# Patient Record
Sex: Female | Born: 1935 | Race: Black or African American | Hispanic: No | Marital: Married | Smoking: Never smoker
Health system: Southern US, Community
[De-identification: ages and names within clinical notes are randomized; demographics above are authoritative.]

## PROBLEM LIST (undated history)

## (undated) DIAGNOSIS — N952 Postmenopausal atrophic vaginitis: Secondary | ICD-10-CM

## (undated) DIAGNOSIS — K08109 Complete loss of teeth, unspecified cause, unspecified class: Secondary | ICD-10-CM

## (undated) DIAGNOSIS — F419 Anxiety disorder, unspecified: Secondary | ICD-10-CM

## (undated) DIAGNOSIS — Z8701 Personal history of pneumonia (recurrent): Secondary | ICD-10-CM

## (undated) DIAGNOSIS — T8859XA Other complications of anesthesia, initial encounter: Secondary | ICD-10-CM

## (undated) DIAGNOSIS — Z972 Presence of dental prosthetic device (complete) (partial): Secondary | ICD-10-CM

## (undated) DIAGNOSIS — T7840XA Allergy, unspecified, initial encounter: Secondary | ICD-10-CM

## (undated) DIAGNOSIS — I471 Supraventricular tachycardia, unspecified: Secondary | ICD-10-CM

## (undated) DIAGNOSIS — M1990 Unspecified osteoarthritis, unspecified site: Secondary | ICD-10-CM

## (undated) DIAGNOSIS — I10 Essential (primary) hypertension: Secondary | ICD-10-CM

## (undated) DIAGNOSIS — K219 Gastro-esophageal reflux disease without esophagitis: Secondary | ICD-10-CM

## (undated) DIAGNOSIS — T4145XA Adverse effect of unspecified anesthetic, initial encounter: Secondary | ICD-10-CM

## (undated) DIAGNOSIS — H409 Unspecified glaucoma: Secondary | ICD-10-CM

## (undated) DIAGNOSIS — C801 Malignant (primary) neoplasm, unspecified: Secondary | ICD-10-CM

## (undated) DIAGNOSIS — E785 Hyperlipidemia, unspecified: Secondary | ICD-10-CM

## (undated) DIAGNOSIS — K5790 Diverticulosis of intestine, part unspecified, without perforation or abscess without bleeding: Secondary | ICD-10-CM

## (undated) DIAGNOSIS — M810 Age-related osteoporosis without current pathological fracture: Secondary | ICD-10-CM

## (undated) DIAGNOSIS — E739 Lactose intolerance, unspecified: Secondary | ICD-10-CM

## (undated) DIAGNOSIS — I456 Pre-excitation syndrome: Secondary | ICD-10-CM

## (undated) DIAGNOSIS — Z8542 Personal history of malignant neoplasm of other parts of uterus: Secondary | ICD-10-CM

## (undated) DIAGNOSIS — E559 Vitamin D deficiency, unspecified: Secondary | ICD-10-CM

## (undated) DIAGNOSIS — Z9889 Other specified postprocedural states: Secondary | ICD-10-CM

## (undated) DIAGNOSIS — R112 Nausea with vomiting, unspecified: Secondary | ICD-10-CM

## (undated) DIAGNOSIS — Z9289 Personal history of other medical treatment: Secondary | ICD-10-CM

## (undated) HISTORY — PX: TOTAL HIP ARTHROPLASTY: SHX124

## (undated) HISTORY — DX: Anxiety disorder, unspecified: F41.9

## (undated) HISTORY — DX: Gastro-esophageal reflux disease without esophagitis: K21.9

## (undated) HISTORY — DX: Diverticulosis of intestine, part unspecified, without perforation or abscess without bleeding: K57.90

## (undated) HISTORY — DX: Vitamin D deficiency, unspecified: E55.9

## (undated) HISTORY — DX: Lactose intolerance, unspecified: E73.9

## (undated) HISTORY — DX: Personal history of malignant neoplasm of other parts of uterus: Z85.42

## (undated) HISTORY — DX: Personal history of other medical treatment: Z92.89

## (undated) HISTORY — DX: Unspecified glaucoma: H40.9

## (undated) HISTORY — DX: Postmenopausal atrophic vaginitis: N95.2

## (undated) HISTORY — DX: Presence of dental prosthetic device (complete) (partial): Z97.2

## (undated) HISTORY — PX: NM MYOCAR PERF WALL MOTION: HXRAD629

## (undated) HISTORY — DX: Allergy, unspecified, initial encounter: T78.40XA

## (undated) HISTORY — DX: Pre-excitation syndrome: I45.6

## (undated) HISTORY — DX: Essential (primary) hypertension: I10

## (undated) HISTORY — DX: Hyperlipidemia, unspecified: E78.5

## (undated) HISTORY — PX: ABDOMINAL HYSTERECTOMY: SHX81

## (undated) HISTORY — PX: THROAT SURGERY: SHX803

## (undated) HISTORY — PX: JOINT REPLACEMENT: SHX530

## (undated) HISTORY — DX: Supraventricular tachycardia: I47.1

## (undated) HISTORY — DX: Age-related osteoporosis without current pathological fracture: M81.0

## (undated) HISTORY — PX: CATARACT EXTRACTION: SUR2

## (undated) HISTORY — DX: Complete loss of teeth, unspecified cause, unspecified class: K08.109

## (undated) HISTORY — DX: Supraventricular tachycardia, unspecified: I47.10

---
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
## 1999-06-21 ENCOUNTER — Other Ambulatory Visit: Admission: RE | Admit: 1999-06-21 | Discharge: 1999-06-21 | Payer: Self-pay | Admitting: Obstetrics and Gynecology

## 2002-01-20 ENCOUNTER — Encounter: Admission: RE | Admit: 2002-01-20 | Discharge: 2002-01-20 | Payer: Self-pay | Admitting: Family Medicine

## 2002-01-20 ENCOUNTER — Encounter: Payer: Self-pay | Admitting: Family Medicine

## 2002-12-17 DIAGNOSIS — Z8542 Personal history of malignant neoplasm of other parts of uterus: Secondary | ICD-10-CM

## 2002-12-17 HISTORY — DX: Personal history of malignant neoplasm of other parts of uterus: Z85.42

## 2002-12-18 ENCOUNTER — Encounter: Payer: Self-pay | Admitting: Obstetrics and Gynecology

## 2002-12-18 ENCOUNTER — Ambulatory Visit (HOSPITAL_COMMUNITY): Admission: RE | Admit: 2002-12-18 | Discharge: 2002-12-18 | Payer: Self-pay | Admitting: Obstetrics and Gynecology

## 2002-12-22 ENCOUNTER — Ambulatory Visit: Admission: RE | Admit: 2002-12-22 | Discharge: 2002-12-22 | Payer: Self-pay | Admitting: Gynecology

## 2002-12-24 ENCOUNTER — Encounter: Payer: Self-pay | Admitting: Gynecology

## 2002-12-29 ENCOUNTER — Inpatient Hospital Stay (HOSPITAL_COMMUNITY): Admission: RE | Admit: 2002-12-29 | Discharge: 2003-01-01 | Payer: Self-pay | Admitting: Obstetrics and Gynecology

## 2002-12-29 ENCOUNTER — Encounter (INDEPENDENT_AMBULATORY_CARE_PROVIDER_SITE_OTHER): Payer: Self-pay

## 2003-01-05 ENCOUNTER — Ambulatory Visit: Admission: RE | Admit: 2003-01-05 | Discharge: 2003-01-05 | Payer: Self-pay | Admitting: Gynecologic Oncology

## 2003-03-03 ENCOUNTER — Ambulatory Visit: Admission: RE | Admit: 2003-03-03 | Discharge: 2003-03-03 | Payer: Self-pay | Admitting: Gynecologic Oncology

## 2003-03-03 ENCOUNTER — Encounter (INDEPENDENT_AMBULATORY_CARE_PROVIDER_SITE_OTHER): Payer: Self-pay | Admitting: *Deleted

## 2003-03-03 ENCOUNTER — Other Ambulatory Visit: Admission: RE | Admit: 2003-03-03 | Discharge: 2003-03-03 | Payer: Self-pay | Admitting: Gynecologic Oncology

## 2003-05-24 ENCOUNTER — Other Ambulatory Visit: Admission: RE | Admit: 2003-05-24 | Discharge: 2003-05-24 | Payer: Self-pay | Admitting: Obstetrics and Gynecology

## 2003-08-31 ENCOUNTER — Other Ambulatory Visit: Admission: RE | Admit: 2003-08-31 | Discharge: 2003-08-31 | Payer: Self-pay | Admitting: Gynecology

## 2003-08-31 ENCOUNTER — Ambulatory Visit: Admission: RE | Admit: 2003-08-31 | Discharge: 2003-08-31 | Payer: Self-pay | Admitting: Gynecology

## 2003-08-31 ENCOUNTER — Encounter (INDEPENDENT_AMBULATORY_CARE_PROVIDER_SITE_OTHER): Payer: Self-pay | Admitting: Specialist

## 2003-11-23 ENCOUNTER — Other Ambulatory Visit: Admission: RE | Admit: 2003-11-23 | Discharge: 2003-11-23 | Payer: Self-pay | Admitting: Obstetrics and Gynecology

## 2004-02-22 ENCOUNTER — Other Ambulatory Visit: Admission: RE | Admit: 2004-02-22 | Discharge: 2004-02-22 | Payer: Self-pay | Admitting: Gynecology

## 2004-02-22 ENCOUNTER — Ambulatory Visit: Admission: RE | Admit: 2004-02-22 | Discharge: 2004-02-22 | Payer: Self-pay | Admitting: Gynecology

## 2004-02-22 ENCOUNTER — Encounter (INDEPENDENT_AMBULATORY_CARE_PROVIDER_SITE_OTHER): Payer: Self-pay | Admitting: Specialist

## 2004-09-18 ENCOUNTER — Other Ambulatory Visit: Admission: RE | Admit: 2004-09-18 | Discharge: 2004-09-18 | Payer: Self-pay | Admitting: Gynecologic Oncology

## 2004-09-18 ENCOUNTER — Ambulatory Visit: Admission: RE | Admit: 2004-09-18 | Discharge: 2004-09-18 | Payer: Self-pay | Admitting: Gynecologic Oncology

## 2004-09-18 ENCOUNTER — Encounter (INDEPENDENT_AMBULATORY_CARE_PROVIDER_SITE_OTHER): Payer: Self-pay | Admitting: Specialist

## 2004-10-26 ENCOUNTER — Encounter (INDEPENDENT_AMBULATORY_CARE_PROVIDER_SITE_OTHER): Payer: Self-pay | Admitting: *Deleted

## 2004-10-26 ENCOUNTER — Ambulatory Visit (HOSPITAL_COMMUNITY): Admission: RE | Admit: 2004-10-26 | Discharge: 2004-10-26 | Payer: Self-pay | Admitting: Gastroenterology

## 2005-01-24 ENCOUNTER — Other Ambulatory Visit: Admission: RE | Admit: 2005-01-24 | Discharge: 2005-01-24 | Payer: Self-pay | Admitting: Obstetrics and Gynecology

## 2005-04-06 ENCOUNTER — Encounter: Admission: RE | Admit: 2005-04-06 | Discharge: 2005-04-06 | Payer: Self-pay | Admitting: Family Medicine

## 2005-04-25 ENCOUNTER — Encounter: Admission: RE | Admit: 2005-04-25 | Discharge: 2005-04-25 | Payer: Self-pay | Admitting: Orthopedic Surgery

## 2005-05-15 ENCOUNTER — Ambulatory Visit: Admission: RE | Admit: 2005-05-15 | Discharge: 2005-05-15 | Payer: Self-pay | Admitting: Gynecologic Oncology

## 2005-05-15 ENCOUNTER — Encounter (INDEPENDENT_AMBULATORY_CARE_PROVIDER_SITE_OTHER): Payer: Self-pay | Admitting: *Deleted

## 2005-11-19 ENCOUNTER — Other Ambulatory Visit: Admission: RE | Admit: 2005-11-19 | Discharge: 2005-11-19 | Payer: Self-pay | Admitting: Obstetrics and Gynecology

## 2006-02-25 ENCOUNTER — Other Ambulatory Visit: Admission: RE | Admit: 2006-02-25 | Discharge: 2006-02-25 | Payer: Self-pay | Admitting: Obstetrics and Gynecology

## 2006-05-22 ENCOUNTER — Other Ambulatory Visit: Admission: RE | Admit: 2006-05-22 | Discharge: 2006-05-22 | Payer: Self-pay | Admitting: Gynecologic Oncology

## 2006-05-22 ENCOUNTER — Encounter (INDEPENDENT_AMBULATORY_CARE_PROVIDER_SITE_OTHER): Payer: Self-pay | Admitting: *Deleted

## 2006-05-22 ENCOUNTER — Ambulatory Visit: Admission: RE | Admit: 2006-05-22 | Discharge: 2006-05-22 | Payer: Self-pay | Admitting: Gynecologic Oncology

## 2006-11-22 ENCOUNTER — Encounter: Admission: RE | Admit: 2006-11-22 | Discharge: 2006-11-22 | Payer: Self-pay | Admitting: Orthopedic Surgery

## 2006-12-02 ENCOUNTER — Other Ambulatory Visit: Admission: RE | Admit: 2006-12-02 | Discharge: 2006-12-02 | Payer: Self-pay | Admitting: Obstetrics and Gynecology

## 2007-03-05 ENCOUNTER — Ambulatory Visit: Payer: Self-pay | Admitting: Family Medicine

## 2007-06-13 ENCOUNTER — Other Ambulatory Visit: Admission: RE | Admit: 2007-06-13 | Discharge: 2007-06-13 | Payer: Self-pay | Admitting: Gynecology

## 2007-06-13 ENCOUNTER — Encounter: Payer: Self-pay | Admitting: Gynecology

## 2007-06-13 ENCOUNTER — Ambulatory Visit: Admission: RE | Admit: 2007-06-13 | Discharge: 2007-06-13 | Payer: Self-pay | Admitting: Gynecology

## 2007-07-29 ENCOUNTER — Inpatient Hospital Stay (HOSPITAL_COMMUNITY): Admission: RE | Admit: 2007-07-29 | Discharge: 2007-08-02 | Payer: Self-pay | Admitting: Orthopedic Surgery

## 2007-07-29 ENCOUNTER — Encounter (INDEPENDENT_AMBULATORY_CARE_PROVIDER_SITE_OTHER): Payer: Self-pay | Admitting: Orthopedic Surgery

## 2007-10-23 ENCOUNTER — Ambulatory Visit: Payer: Self-pay | Admitting: Family Medicine

## 2007-10-27 ENCOUNTER — Ambulatory Visit: Payer: Self-pay | Admitting: Family Medicine

## 2007-11-06 ENCOUNTER — Other Ambulatory Visit: Admission: RE | Admit: 2007-11-06 | Discharge: 2007-11-06 | Payer: Self-pay | Admitting: Obstetrics and Gynecology

## 2008-05-05 ENCOUNTER — Ambulatory Visit: Payer: Self-pay | Admitting: Family Medicine

## 2008-05-11 ENCOUNTER — Ambulatory Visit: Payer: Self-pay | Admitting: Family Medicine

## 2008-08-06 ENCOUNTER — Ambulatory Visit: Payer: Self-pay | Admitting: Family Medicine

## 2008-08-24 ENCOUNTER — Ambulatory Visit: Payer: Self-pay | Admitting: Family Medicine

## 2008-09-29 ENCOUNTER — Ambulatory Visit: Payer: Self-pay | Admitting: Family Medicine

## 2008-11-03 ENCOUNTER — Other Ambulatory Visit: Admission: RE | Admit: 2008-11-03 | Discharge: 2008-11-03 | Payer: Self-pay | Admitting: Family Medicine

## 2008-11-03 ENCOUNTER — Ambulatory Visit: Payer: Self-pay | Admitting: Family Medicine

## 2008-11-03 ENCOUNTER — Encounter: Payer: Self-pay | Admitting: Family Medicine

## 2009-03-02 ENCOUNTER — Encounter: Admission: RE | Admit: 2009-03-02 | Discharge: 2009-03-02 | Payer: Self-pay | Admitting: Orthopedic Surgery

## 2009-06-07 ENCOUNTER — Inpatient Hospital Stay (HOSPITAL_COMMUNITY): Admission: RE | Admit: 2009-06-07 | Discharge: 2009-06-11 | Payer: Self-pay | Admitting: Orthopedic Surgery

## 2009-09-12 ENCOUNTER — Ambulatory Visit: Payer: Self-pay | Admitting: Family Medicine

## 2009-11-09 ENCOUNTER — Ambulatory Visit: Payer: Self-pay | Admitting: Family Medicine

## 2010-04-17 IMAGING — CR DG PORTABLE PELVIS
1 series · 1 of 1 positions shown · non-contrast
Comparison: Pelvis film of 07/29/2007

CLINICAL DATA: Left hip arthroplasty

PORTABLE PELVIS

[view not recorded]
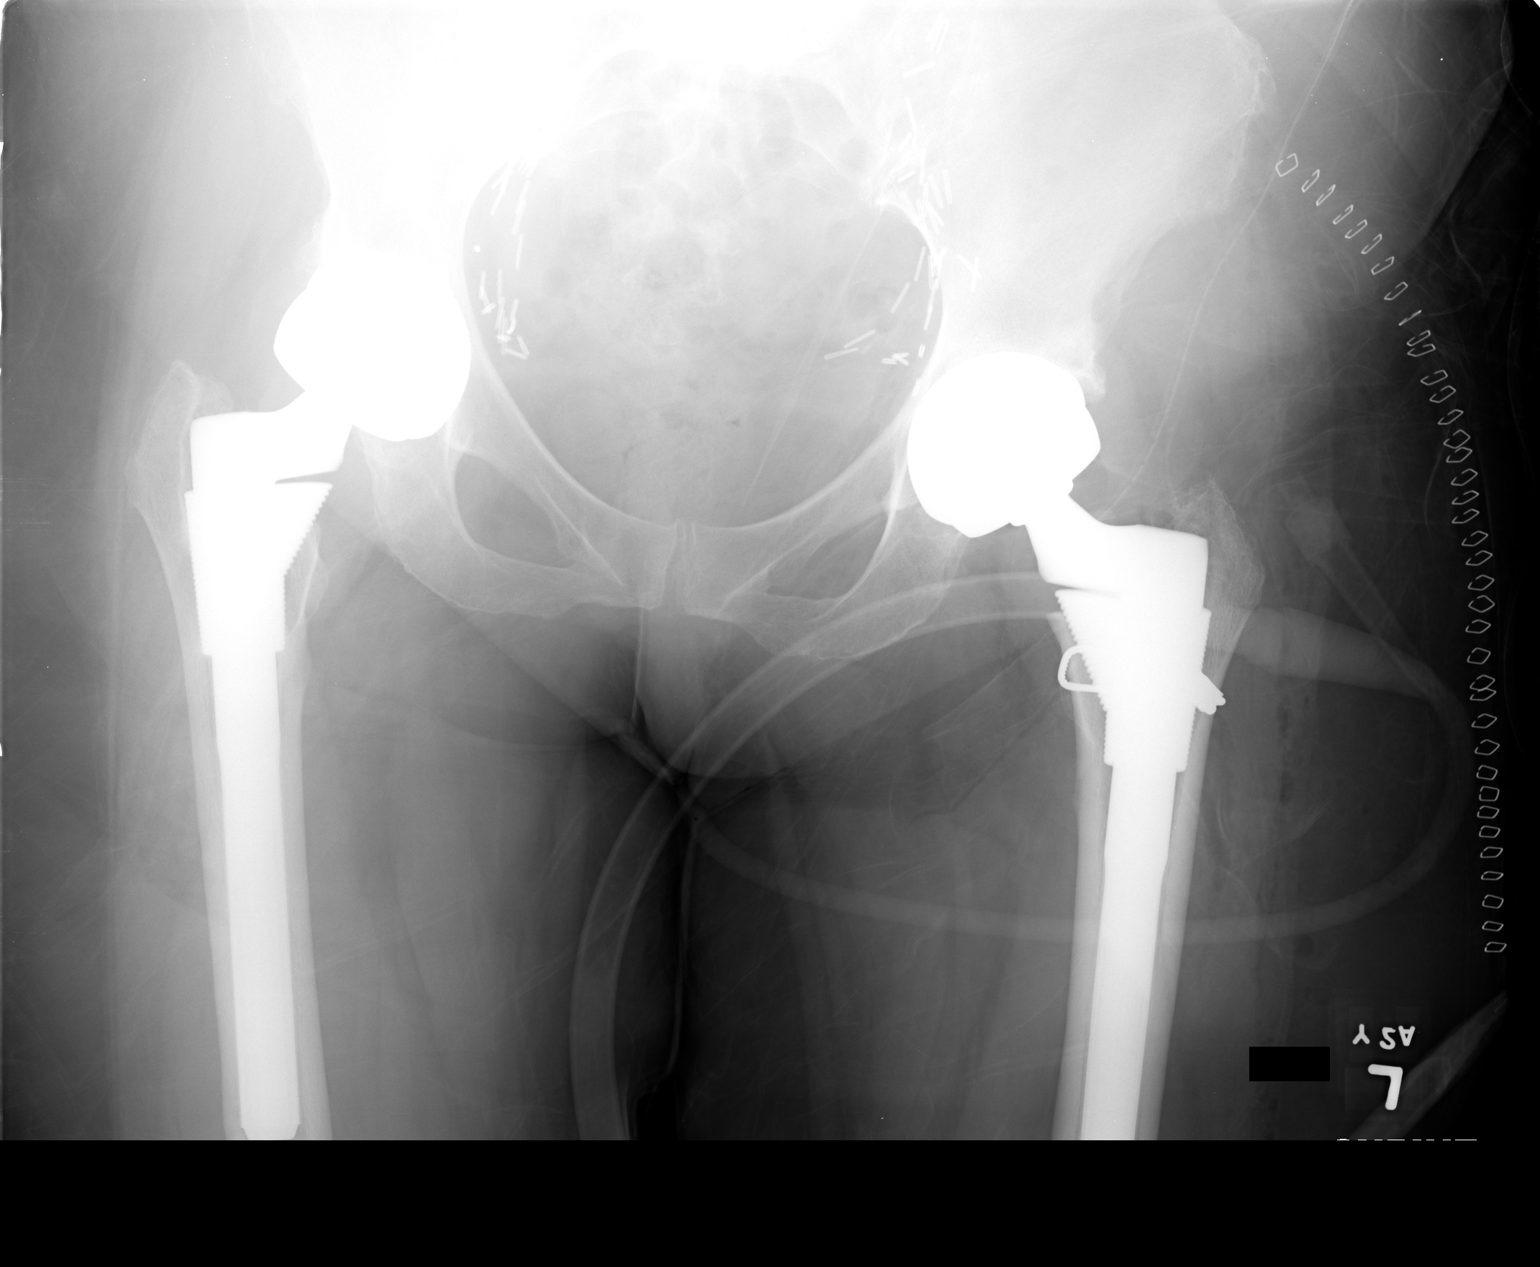

[1 of 1 positions shown; findings below may reference images not displayed]

FINDINGS: Left hip arthroplasty is now present in good position on
the film obtained.  The pelvic rami are intact.  Right hip
arthroplasty is unchanged.  Surgical clips overlie both pelvic
sidewalls.
IMPRESSION: Left hip arthroplasty in good position on portable film of the
pelvis.

## 2010-04-17 IMAGING — CR DG HIP 1V PORT*L*
1 series · 1 of 1 positions shown · non-contrast
Comparison: None

CLINICAL DATA: Postop left hip surgery

PORTABLE LEFT HIP - 1 VIEW

[view not recorded]
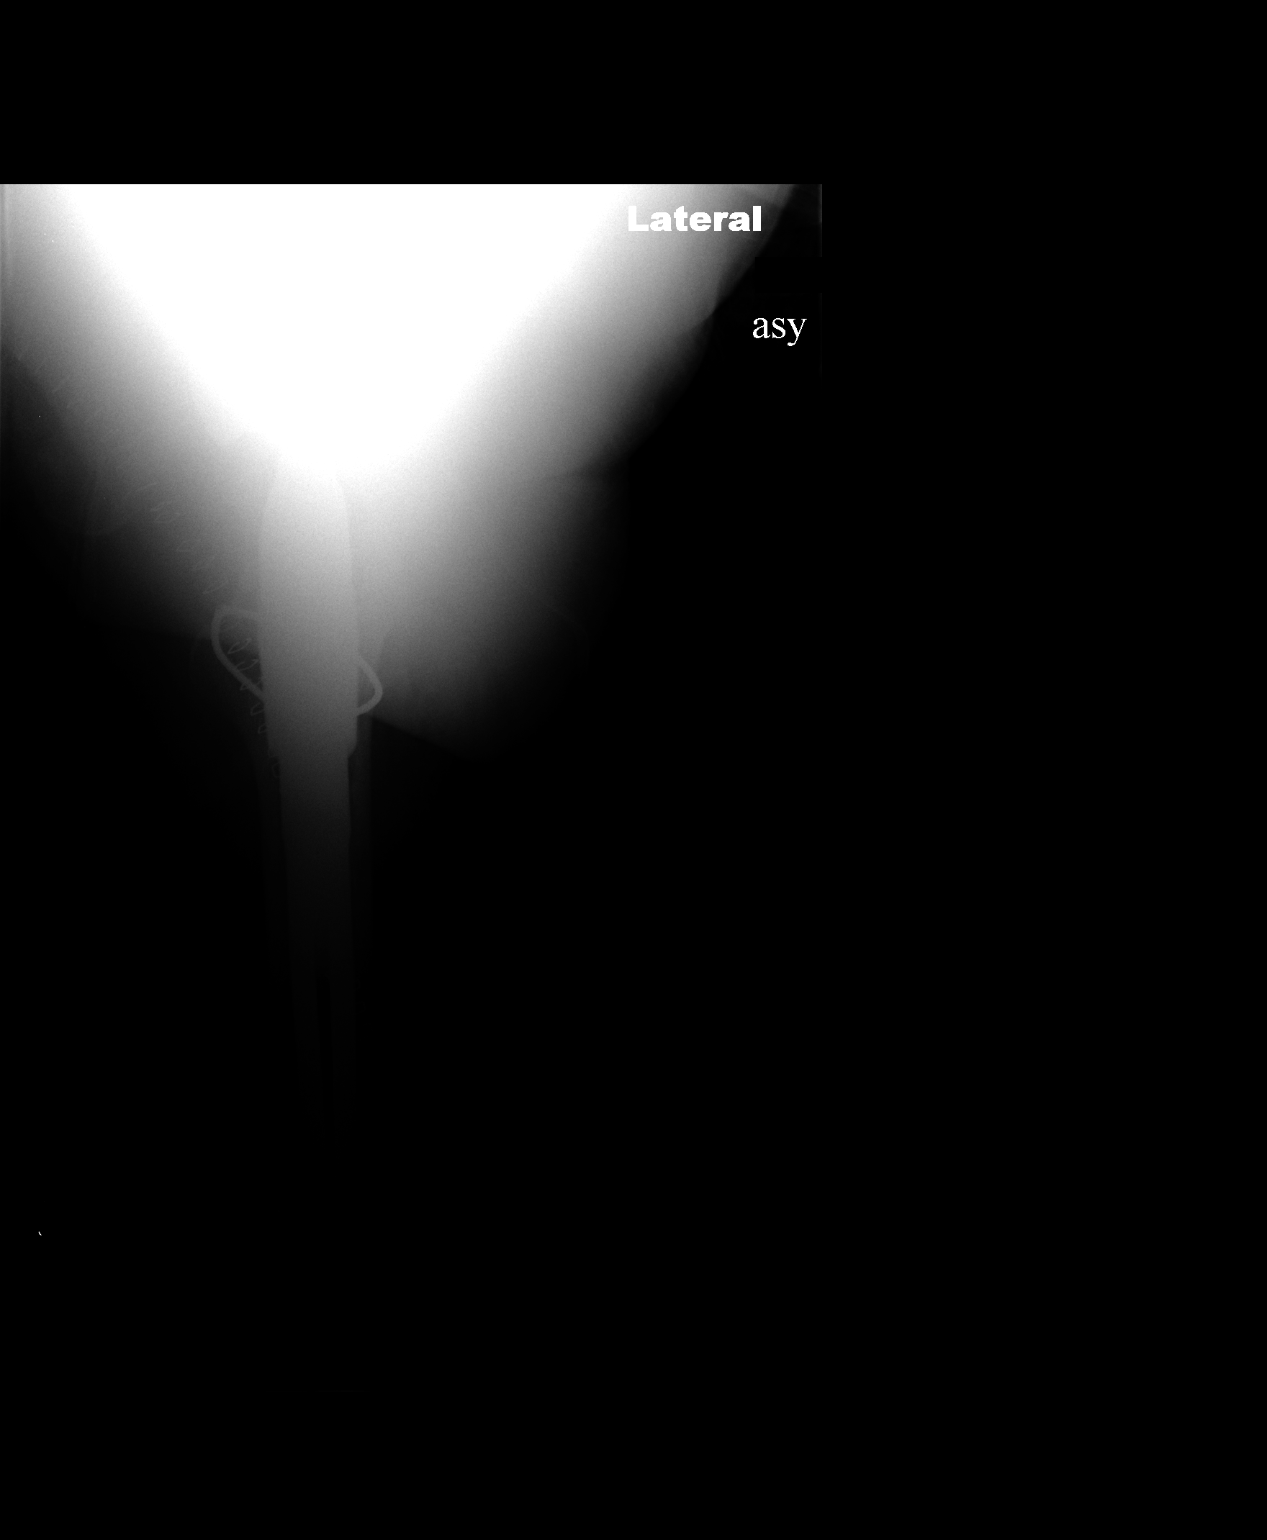

[1 of 1 positions shown; findings below may reference images not displayed]

FINDINGS: A portable lateral view of the left hip is over
penetrated. Alignment appears grossly normal.  Bony structures
cannot be well assessed.
IMPRESSION: Suboptimal portable lateral view of the left hip.

## 2010-05-25 ENCOUNTER — Ambulatory Visit: Payer: Self-pay | Admitting: Family Medicine

## 2010-05-31 ENCOUNTER — Ambulatory Visit: Payer: Self-pay | Admitting: Physician Assistant

## 2010-06-22 DIAGNOSIS — Z9289 Personal history of other medical treatment: Secondary | ICD-10-CM

## 2010-06-22 HISTORY — DX: Personal history of other medical treatment: Z92.89

## 2010-08-03 ENCOUNTER — Ambulatory Visit: Payer: Self-pay | Admitting: Family Medicine

## 2010-08-22 ENCOUNTER — Encounter: Admission: RE | Admit: 2010-08-22 | Discharge: 2010-08-22 | Payer: Self-pay | Admitting: Orthopedic Surgery

## 2010-08-29 ENCOUNTER — Ambulatory Visit (HOSPITAL_COMMUNITY): Admission: RE | Admit: 2010-08-29 | Discharge: 2010-08-29 | Payer: Self-pay | Admitting: Orthopedic Surgery

## 2010-09-12 ENCOUNTER — Encounter (INDEPENDENT_AMBULATORY_CARE_PROVIDER_SITE_OTHER): Payer: Self-pay | Admitting: Orthopedic Surgery

## 2010-09-12 ENCOUNTER — Inpatient Hospital Stay (HOSPITAL_COMMUNITY)
Admission: RE | Admit: 2010-09-12 | Discharge: 2010-09-15 | Payer: Self-pay | Source: Home / Self Care | Admitting: Orthopedic Surgery

## 2011-01-07 ENCOUNTER — Encounter: Payer: Self-pay | Admitting: Orthopedic Surgery

## 2011-03-01 LAB — DIFFERENTIAL
Basophils Relative: 1 % (ref 0–1)
Eosinophils Absolute: 0.1 10*3/uL (ref 0.0–0.7)
Eosinophils Relative: 3 % (ref 0–5)
Lymphs Abs: 1.6 10*3/uL (ref 0.7–4.0)
Monocytes Absolute: 0.4 10*3/uL (ref 0.1–1.0)
Monocytes Relative: 10 % (ref 3–12)
Neutro Abs: 2.1 10*3/uL (ref 1.7–7.7)
Neutrophils Relative %: 50 % (ref 43–77)

## 2011-03-01 LAB — BODY FLUID CULTURE
Culture: NO GROWTH
Gram Stain: NONE SEEN

## 2011-03-01 LAB — BASIC METABOLIC PANEL
BUN: 10 mg/dL (ref 6–23)
CO2: 24 mEq/L (ref 19–32)
CO2: 26 mEq/L (ref 19–32)
Calcium: 7.7 mg/dL — ABNORMAL LOW (ref 8.4–10.5)
Calcium: 8 mg/dL — ABNORMAL LOW (ref 8.4–10.5)
Chloride: 107 mEq/L (ref 96–112)
Chloride: 107 mEq/L (ref 96–112)
Creatinine, Ser: 0.96 mg/dL (ref 0.4–1.2)
Creatinine, Ser: 1.2 mg/dL (ref 0.4–1.2)
GFR calc Af Amer: 53 mL/min — ABNORMAL LOW (ref >60)
GFR calc Af Amer: >60 mL/min (ref >60)
GFR calc Af Amer: >60 mL/min (ref >60)
GFR calc non Af Amer: 44 mL/min — ABNORMAL LOW (ref >60)
GFR calc non Af Amer: 57 mL/min — ABNORMAL LOW (ref >60)
Glucose, Bld: 109 mg/dL — ABNORMAL HIGH (ref 70–99)
Glucose, Bld: 112 mg/dL — ABNORMAL HIGH (ref 70–99)
Glucose, Bld: 77 mg/dL (ref 70–99)
Potassium: 4 mEq/L (ref 3.5–5.1)
Potassium: 4 mEq/L (ref 3.5–5.1)
Sodium: 139 mEq/L (ref 135–145)

## 2011-03-01 LAB — CBC
HCT: 24.6 % — ABNORMAL LOW (ref 36.0–46.0)
HCT: 26.4 % — ABNORMAL LOW (ref 36.0–46.0)
Hemoglobin: 8 g/dL — ABNORMAL LOW (ref 12.0–15.0)
Hemoglobin: 8.8 g/dL — ABNORMAL LOW (ref 12.0–15.0)
MCH: 27.4 pg (ref 26.0–34.0)
MCH: 27.4 pg (ref 26.0–34.0)
MCH: 27.8 pg (ref 26.0–34.0)
MCHC: 32.4 g/dL (ref 30.0–36.0)
MCHC: 32.5 g/dL (ref 30.0–36.0)
MCHC: 33.3 g/dL (ref 30.0–36.0)
MCV: 83.5 fL (ref 78.0–100.0)
MCV: 84.2 fL (ref 78.0–100.0)
MCV: 85.1 fL (ref 78.0–100.0)
Platelets: 142 10*3/uL — ABNORMAL LOW (ref 150–400)
Platelets: 143 10*3/uL — ABNORMAL LOW (ref 150–400)
Platelets: 177 10*3/uL (ref 150–400)
RBC: 2.92 MIL/uL — ABNORMAL LOW (ref 3.87–5.11)
RBC: 3.16 MIL/uL — ABNORMAL LOW (ref 3.87–5.11)
RDW: 14.9 % (ref 11.5–15.5)
RDW: 15.3 % (ref 11.5–15.5)
RDW: 15.4 % (ref 11.5–15.5)
WBC: 6.4 10*3/uL (ref 4.0–10.5)
WBC: 7.5 10*3/uL (ref 4.0–10.5)

## 2011-03-01 LAB — TYPE AND SCREEN: ABO/RH(D): B POS

## 2011-03-01 LAB — ANAEROBIC CULTURE: Gram Stain: NONE SEEN

## 2011-03-01 LAB — URINALYSIS, ROUTINE W REFLEX MICROSCOPIC
Bilirubin Urine: NEGATIVE
Glucose, UA: NEGATIVE mg/dL
Hgb urine dipstick: NEGATIVE
Specific Gravity, Urine: 1.013 (ref 1.005–1.030)
Urobilinogen, UA: 1 mg/dL (ref 0.0–1.0)
pH: 6.5 (ref 5.0–8.0)

## 2011-03-01 LAB — URINE MICROSCOPIC-ADD ON

## 2011-03-01 LAB — SEDIMENTATION RATE: Sed Rate: 47 mm/hr — ABNORMAL HIGH (ref 0–22)

## 2011-03-01 LAB — URINE CULTURE
Colony Count: 2000
Culture  Setup Time: 201109261559

## 2011-03-01 LAB — PROTIME-INR
INR: 1.92 — ABNORMAL HIGH (ref 0.00–1.49)
Prothrombin Time: 13.4 seconds (ref 11.6–15.2)
Prothrombin Time: 16.3 seconds — ABNORMAL HIGH (ref 11.6–15.2)
Prothrombin Time: 22.1 seconds — ABNORMAL HIGH (ref 11.6–15.2)
Prothrombin Time: 24.3 seconds — ABNORMAL HIGH (ref 11.6–15.2)

## 2011-03-01 LAB — PREPARE RBC (CROSSMATCH)

## 2011-03-01 LAB — POCT I-STAT 4, (NA,K, GLUC, HGB,HCT)
Glucose, Bld: 121 mg/dL — ABNORMAL HIGH (ref 70–99)
HCT: 29 % — ABNORMAL LOW (ref 36.0–46.0)
Potassium: 4.2 mEq/L (ref 3.5–5.1)

## 2011-03-01 LAB — SURGICAL PCR SCREEN: Staphylococcus aureus: POSITIVE — AB

## 2011-03-26 LAB — CBC
HCT: 20.5 % — ABNORMAL LOW (ref 36.0–46.0)
HCT: 26 % — ABNORMAL LOW (ref 36.0–46.0)
HCT: 45.3 % (ref 36.0–46.0)
MCV: 86.2 fL (ref 78.0–100.0)
MCV: 88.3 fL (ref 78.0–100.0)
Platelets: 119 10*3/uL — ABNORMAL LOW (ref 150–400)
Platelets: 127 10*3/uL — ABNORMAL LOW (ref 150–400)
Platelets: 138 10*3/uL — ABNORMAL LOW (ref 150–400)
Platelets: 206 10*3/uL (ref 150–400)
RDW: 14.4 % (ref 11.5–15.5)
WBC: 10.3 10*3/uL (ref 4.0–10.5)
WBC: 6.3 10*3/uL (ref 4.0–10.5)
WBC: 9.3 10*3/uL (ref 4.0–10.5)

## 2011-03-26 LAB — BASIC METABOLIC PANEL
BUN: 13 mg/dL (ref 6–23)
BUN: 6 mg/dL (ref 6–23)
BUN: 8 mg/dL (ref 6–23)
Calcium: 9.6 mg/dL (ref 8.4–10.5)
Chloride: 110 mEq/L (ref 96–112)
Creatinine, Ser: 0.87 mg/dL (ref 0.4–1.2)
Creatinine, Ser: 0.91 mg/dL (ref 0.4–1.2)
GFR calc non Af Amer: >60 mL/min (ref >60)
GFR calc non Af Amer: >60 mL/min (ref >60)
Glucose, Bld: 104 mg/dL — ABNORMAL HIGH (ref 70–99)
Glucose, Bld: 98 mg/dL (ref 70–99)
Potassium: 3.6 mEq/L (ref 3.5–5.1)
Potassium: 4.4 mEq/L (ref 3.5–5.1)

## 2011-03-26 LAB — POCT I-STAT 4, (NA,K, GLUC, HGB,HCT)
Glucose, Bld: 111 mg/dL — ABNORMAL HIGH (ref 70–99)
HCT: 27 % — ABNORMAL LOW (ref 36.0–46.0)
Hemoglobin: 9.2 g/dL — ABNORMAL LOW (ref 12.0–15.0)
Potassium: 4.1 mEq/L (ref 3.5–5.1)
Sodium: 141 mEq/L (ref 135–145)

## 2011-03-26 LAB — PROTIME-INR
INR: 2.5 — ABNORMAL HIGH (ref 0.00–1.49)
Prothrombin Time: 12.9 seconds (ref 11.6–15.2)

## 2011-03-26 LAB — URINE MICROSCOPIC-ADD ON

## 2011-03-26 LAB — DIFFERENTIAL
Basophils Absolute: 0 10*3/uL (ref 0.0–0.1)
Eosinophils Relative: 2 % (ref 0–5)
Lymphocytes Relative: 36 % (ref 12–46)
Neutrophils Relative %: 49 % (ref 43–77)

## 2011-03-26 LAB — URINALYSIS, ROUTINE W REFLEX MICROSCOPIC
Bilirubin Urine: NEGATIVE
Hgb urine dipstick: NEGATIVE
Protein, ur: NEGATIVE mg/dL
Urobilinogen, UA: 1 mg/dL (ref 0.0–1.0)

## 2011-03-26 LAB — TYPE AND SCREEN
ABO/RH(D): B POS
Antibody Screen: NEGATIVE

## 2011-03-26 LAB — URINE CULTURE
Colony Count: NO GROWTH
Culture: NO GROWTH

## 2011-04-20 ENCOUNTER — Encounter: Payer: Self-pay | Admitting: Family Medicine

## 2011-05-01 NOTE — Op Note (Signed)
NAME:  [name], [name]            ACCOUNT NO.:  [id number]   MEDICAL RECORD NO.:  [id number]          PATIENT TYPE:  INP   LOCATION:  2899                         FACILITY:  [facility]   PHYSICIAN:  Burnard Bunting, M.D.    DATE OF BIRTH:  Jun 13, 1933   DATE OF PROCEDURE:  06/07/2009  DATE OF DISCHARGE:                               OPERATIVE REPORT   PREOPERATIVE DIAGNOSIS:  Left hip arthritis.   POSTOPERATIVE DIAGNOSIS:  Left hip arthritis.   PROCEDURE:  Left total hip replacement.   SURGEON:  Burnard Bunting, MD   ASSISTANT:  Wende Neighbors, PA   ANESTHESIA:  General.   INDICATIONS:  [name] is a 77 year old patient with left hip  pain who failed nonoperative management who has had right total hip  replacement and presents for left total hip replacement after  explanation risks and benefits.   ESTIMATED BLOOD LOSS:  500 mL.   PROCEDURE IN DETAIL:  The patient was brought to operating room where  general endotracheal anesthesia was induced.  Time-out was called.  Preoperative antibiotics administered.  The patient was placed in  lateral decubitus position with the right axilla and right peroneal  nerve well-padded.  Left hip, leg, and foot were prepped after  prescrubbing with alcohol and Betadine which was allowed air dry.  The  wound was prepped with DuraPrep solution and draped in sterile manner.  [name] was used to cover the operative field.  Posterior approach to hip  was utilized.  Skin and subcutaneous tissues were sharply divided.  Fascia lata was divided.  Gluteus maximus muscle was divided in line  with its fibers.  Sciatic nerve was palpated and protected all times  during the case.  The piriformis tendon was tagged and detached along  the other external rotators from the capsule.  The capsule was then  tagged with Ethibond suture and splinted in T-shape fashion.  The head  was dislocated.  Femoral neck cut was made in accordance with  preoperative templating  and with the help of a preconstructed broach,  head and neck construct which matched her left lateral side.  Cut was  made.  The femur was then reamed up to 15.5 mm consistent with the  previous femur.  At this time, acetabular retractor was placed.  With  good exposure, the labrum was excised around the hip joint and hip  socket was then reamed up to size 49-50 cup.  Good bleeding bone was  encountered.  The reaming was performed in 45 degrees of abduction and  15 degrees of anteversion.  The true cup was then placed with a good fit  obtained.  Final preparation of the femur was performed with spout  reaming and placement of the proximal sleeve.  Calcar crack was  encountered with placement of proximal sleeve which was fixed with a  cable.  The proximal sleeve was then placed back in and the femoral stem  was placed and the head was then reduced and the fracture was reduced  with a variety of combinations of neck lengths and offset.  The stable  construct was  a +3 neck with 12 offset.  Intraoperative radiograph  demonstrated approximately equal leg lengths and good position of the  cup and stem.  Trial components in the femur were removed.  Thorough  irrigation was performed.  True components were placed in the same  anteversion and same stability parameters were maintained with good  stability and external rotation and full extension, position of sleeve,  9 degrees of hip flexion, 10 degrees of abduction, and 70 degrees of  internal rotation.  These stability parameters were maintained with good  trial and the true prosthesis.  Sciatic nerve was again palpated and  found to be palpable and intact.  The capsule was then closed using #1  Vicryl suture.  The piriformis tendon was tacked to the capsule using #1  Vicryl suture.  The Hemovac drain was placed.  Fascia lata was  reapproximated using #1 Vicryl suture followed by interrupted inverted 0-  Vicryl suture, 2-0 Vicryl suture, and skin  staples.  Leg lengths were  approximately equal at the conclusion of the case.  [name]'s  assistance was required all times during the case for retraction of  important neurovascular structures and limb positioning.  Her assistance  was a medical necessity.      Burnard Bunting, M.D.  Electronically Signed     GSD/MEDQ  D:  06/07/2009  T:  06/08/2009  Job:  [id number]

## 2011-05-01 NOTE — Consult Note (Signed)
NAME:  [name], [name]            ACCOUNT NO.:  [id number]   MEDICAL RECORD NO.:  [id number]          PATIENT TYPE:  OUT   LOCATION:  GYN                          FACILITY:  [facility]   PHYSICIAN:  De Blanch, M.D.DATE OF BIRTH:  1933/08/31   DATE OF CONSULTATION:  06/13/2007  DATE OF DISCHARGE:                                 CONSULTATION   GYN ONCOLOGY CLINIC CONSULTATION   CHIEF COMPLAINT:  Carcinosarcoma of the uterus.   INTERVAL HISTORY:  Since her last visit the patient has done well.  She  denies any GI or GU symptoms.  She has no pelvic pain, pressure, vaginal  bleeding or discharge.  Her functional status has been excellent.   HISTORY OF PRESENT ILLNESS:  The patient underwent a TAH, BSO, pelvic  and periaortic lymphadenectomy in January of 2004 for carcinosarcoma.  Final stage was stage Ib.  She did not receive any adjuvant therapy.   PAST MEDICAL HISTORY:  1. Hypertension.  2. Gastroesophageal reflux disease.   PAST SURGICAL HISTORY:  TAH, BSO, pelvic and periaortic lymphadenectomy,  D and C.   OBSTETRICAL HISTORY:  Gravida 1.   CURRENT MEDICATIONS:  Atenolol.   ALLERGIES:  Drug allergies:  None.   SOCIAL HISTORY:  The patient is married. She works as a Advertising copywriter.  She does not smoke.   REVIEW OF SYSTEMS:  A ten point comprehensive review of systems is  negative except as noted above.   PHYSICAL EXAMINATION:  VITAL SIGNS:  Weight 162 pounds.  Blood pressure  134/74, pulse 80, respiratory rate 20.  GENERAL APPEARANCE:  The patient is a healthy-appearing black female in  no acute distress.  HEENT: Negative.  NECK:  Supple without thyromegaly.  LYMPH NODES:  There is no supraclavicular or inguinal adenopathy.  ABDOMEN:  Soft, nontender with no masses, organomegaly, incisional  hernias noted.  Midline incision is well healed.  No hernias are noted.  There is no ascites.  PELVIC: EB/BUS, vagina, bladder, urethra are normal.  The patient does  have a  considerable amount of vaginal relaxation with a cystocele,  rectocele and enterocele which apparently are asymptomatic.  No lesions  noted.  Bimanual and rectovaginal exam reveal no mass, induration or  nodularity.  Rectovaginal exam confirms.  EXTREMITIES:  Lower extremities are without edema, varicosities.   IMPRESSION:  Stage 1b carcinosarcoma of the uterus, January 2004.  No  evidence of recurrent disease.   PLAN:  Pap smear obtained today.  The patient is to return to see Dr.  Lonell Face in six months.  At that juncture she will have completed  five years of followup and we will release her to the care of Dr.  Ashley Royalty for annual examinations.      De Blanch, M.D.  Electronically Signed     DC/MEDQ  D:  06/13/2007  T:  06/13/2007  Job:  [id number]   cc:   Fayrene Fearing A. Ashley Royalty, M.D.  Fax: [phone]   Telford Nab, R.N.  [address]  [place], [place] [address]   Sharlot Gowda, M.D.  Fax: [phone]

## 2011-05-01 NOTE — Op Note (Signed)
NAME:  [name], [name]            ACCOUNT NO.:  [id number]   MEDICAL RECORD NO.:  [id number]          PATIENT TYPE:  INP   LOCATION:  5004                         FACILITY:  [facility]   PHYSICIAN:  Burnard Bunting, M.D.    DATE OF BIRTH:  1933/10/03   DATE OF PROCEDURE:  07/29/2007  DATE OF DISCHARGE:                               OPERATIVE REPORT   PREOPERATIVE DIAGNOSIS:  Right hip arthritis.   POSTOPERATIVE DIAGNOSIS:  Right hip arthritis.   PROCEDURE:  Right total hip replacement.   SURGEON:  Burnard Bunting, M.D.   ASSISTANT:  Jerolyn Shin. Tresa Res, M.D.   ANESTHESIA:  General endotracheal anesthesia.   ESTIMATED BLOOD LOSS:  400 mL.   DRAINS:  None.   COMPONENTS UTILIZED:  S-ROM acetabular cup 50, S-ROM femur 38 standard  neck plus 8 lateral 20 x 15 x 165 with the 20D small proximal sleeve  plus 0 taper sleeve adapter 45 head.   INDICATIONS:  [name] is a 77 year old female with end stage  right hip arthritis who presents for total hip arthroplasty after  explanation of risks and benefits.   PROCEDURE IN DETAIL:  The patient was brought to the operating room  where general endotracheal anesthesia was induced.  Preoperative  antibiotics were administered.  A time out was performed.  The patient  was placed in the lateral decubitus position with the left axilla and  left peroneal nerve well padded.  The right hip, leg, and foot was  prepped with DuraPrep solution and draped in a sterile manner.  [name]  was used for the operative field.  A posterior approach to the hip was  utilized.  The skin and subcutaneous tissue were sharply divided.  The  fascia lata was divided and the gluteus maximus muscles were split in  the direction of their tendons.  Bleeding points encountered were  controlled with electrocautery.  The sciatic nerve was identified,  palpated at all times during the remaining portion of the case.  The  piriformis tendon was tagged and detached. The other  external rotators  were detached from the capsule.  The capsule was then T'd with each flap  sutured with #1 Ethibond suture.  The hip was dislocated. Using a trial  prosthesis head and neck assembly, the neck cut was made to equalize leg  lengths.  The tip of the trial was brought to the tip of the head and  the neck cut was then performed.  Following the neck cut, the femoral  canal was reamed up to 15.5.  Good cortical contact was achieved  throughout.  The acetabulum was then reamed in 45 degrees of abduction  and 20 degrees of anteversion.  A 49 reamer gave excellent bleeding  bone.  The cup was then press fit into position with good purchase  obtained.  At this time, proximal reaming was performed using the S-ROM  system.  The proximal sleeve was placed and the stem was placed in 20  degrees of anteversion.  With a plus 0, the patient had approximately  equal leg lengths by x-ray with good stability with external  rotation  and full extension, position of sleep, 10 degrees of abduction, 90  degrees of hip flexion, and 70 degrees of internal rotation. At this  time, the trial components were removed from the femur.  Thorough  pulsatile irrigation was performed.  The true components were placed,  the same stability parameters were maintained.  The sciatic nerve was  again palpated and found to be intact.  The capsule was then closed  using #1 Ethibond, the piriformis tendon was tacked to the capsule using  the tacking sutures, the fascia lata was closed over a drain using #1  Vicryl suture followed by interrupted inverted 0 Vicryl suture, 2-0  Vicryl suture and skin staples.  An Mepilex dressing was placed.  Leg  lengths were approximately equal at the conclusion of the case.  Dorsiflexion of the ankle was observed.  The patient tolerated the  procedure well without immediate complications.  Dr. Lenny Pastel  assistance was required at all times during the case as a medical  necessity  for retraction of important neurovascular structures.      Burnard Bunting, M.D.  Electronically Signed     GSD/MEDQ  D:  07/29/2007  T:  07/30/2007  Job:  [id number]

## 2011-05-04 NOTE — H&P (Signed)
NAME:  [name], [name]                      ACCOUNT NO.:  [id number]   MEDICAL RECORD NO.:  [id number]                   PATIENT TYPE:  INP   LOCATION:  NA                                   FACILITY:  [facility]   PHYSICIAN:  [name], M.D.             DATE OF BIRTH:  23-Aug-1933   DATE OF ADMISSION:  12/29/2002  DATE OF DISCHARGE:                                HISTORY & PHYSICAL   HISTORY OF PRESENT ILLNESS:  This is a 77 year old gravida 2, para 1, AB 1  referred through the courtesy of  Maree Erie for a recent  abnormal ultrasound.  The patient complains of low-back pain for  approximately three weeks duration.  She also complains of feeling like  Something dropped.  Finally, she noted a vaginal discharge that contained  some color for approximately three weeks prior to presentation.  She  denies any frank vaginal bleeding.  She denies ever having been on hormone  replacement therapy.  She denies other GI, GU, Gyn or constitutional  symptoms.   An ultrasound was obtained December 03, 2002 by the referring office, which  revealed a markedly thickened endometrium, heterogeneous in appearance, and  worrisome for neoplasm.  A subsequent endometrial biopsy was performed  through my office, which revealed a carcinosarcoma.  The patient is  hence  for exploratory laparotomy with total abdominal hysterectomy and bilateral  salpingo-oophorectomy, and surgical staging to include partial omentectomy,  and pelvic and periaortic lymphadenectomy.   MEDICATIONS:  Allegra D and Flonase.   PAST MEDICAL HISTORY:  Negative.   PAST SURGICAL HISTORY:  Negative.   ALLERGIES:  No known drug allergies.   FAMILY HISTORY:  Noncontributory.   SOCIAL HISTORY:  The patient denies the use of tobacco or significant  alcohol.   REVIEW OF SYSTEMS:  Noncontributory.   PHYSICAL EXAMINATION:  GENERAL:  Well-developed, well-nourished pleasant  black female in no acute distress.  VITAL SIGNS:  Afebrile.  Vital signs stable.  SKIN:  Warm and dry without lesions.  LYMPHATICS:  There is no supraclavicular, cervical or inguinal adenopathy.  HEENT:  Normocephalic.  NECK:  Supple without thyromegaly.  CHEST AND LUNGS:  Clear.  CARDIAC:  Regular rate and rhythm without murmurs, gallops or rubs.  BREASTS:  Soft without palpable mass, discharge, retraction or adenopathy.  ABDOMEN:  Soft and nontender without masses or organomegaly.  Bowel sounds  are active.  MUSCULOSKELETAL EXAMINATION:  Reveals full range of motion without edema,  cyanosis or CVA tenderness.  PELVIC EXAMINATION:  Deferred until examination under anesthesia.  In the  office the patient had third to fourth degree rectocele and no obvious  cystocele or enterocele.   LABORATORY DATA:  CT scan obtained December 18, 2002 revealed no evidence of  lymphadenopathy or free fluid in the abdomen.  There was apparent fluid in  the endometrial cavity.  There was no definite pelvic side wall adenopathy  or obvious extrauterine  extension.   IMPRESSION:  1. Carcinosarcoma of the uterus.  2. Third to fourth degree rectocele.   PLAN:  Exploratory laparotomy with total abdominal hysterectomy, bilateral  salpingo-oophorectomy and surgical staging to include partial omentectomy,  and pelvic and periaortic lymphadenectomy.  Risks, benefits, and  alternatives were fully discussed with the patient.  She states she  understands and accepts.  Questions were asked and answered.                                                 [name], M.D.    JAM/MEDQ  D:  12/28/2002  T:  12/29/2002  Job:  [id number]   cc:   Telford Nab, R.N.  [address] [place], [place] [address]  Fax: [phone]   Sharlot Gowda, M.D.  [address]  [place], [place] [address]  Fax: [phone]   Lorelle Gibbs, P.A.-C  [address] [place], [place]  [address]

## 2011-05-04 NOTE — Discharge Summary (Signed)
   NAME:  [name], [name]                      ACCOUNT NO.:  [id number]   MEDICAL RECORD NO.:  [id number]                   PATIENT TYPE:  INP   LOCATION:  0466                                 FACILITY:  [facility]   PHYSICIAN:  Rudy Jew. Ashley Royalty, M.D.             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   PROCEDURES:  1. Total abdominal hysterectomy.  2. Bilateral salingo-oophorectomy.  3. Pelvic and periaortic lymphadenectomy.   CONSULTATIONS:  None.   DISCHARGE MEDICATIONS:  Percocet.   HISTORY OF PRESENT ILLNESS:  This is a 77 year old, G2, P1, AB1 referred  through the courteous of Dr. Katha Hamming for recent abnormal  ultrasound.  After a biopsy was obtained in my office, the results showed  carcinosarcoma.  The patient was admitted for staging laparotomy.   HOSPITAL COURSE:  The patient was admitted to [facility].  Admission laboratory studies were drawn.  On December 29, 2002, she was  t4aken to the operating room and underwent total abdominal hysterectomy,  bilateral salingo-oophorectomy with periaortic and pelvic lymphadenectomy.  This was performed by Dr. Stanford Breed with Dr. Sylvester Harder assisting.  There were no intraoperative complications.  The patient's postoperative  course was complicated by modest transient hypertension which resolved  without pharmacologic or other intervention.  She was discharged on postop  day #3, afebrile and in satisfactory condition.   LABORATORY DATA AND X-RAY FINDINGS:  Hemoglobin and hematocrit on admission  were 14.3 and 42.0 respectively.  Repeat values were obtained on January 14,  at 14.6 and 42.5 respectively.  Pathology was pending.   FOLLOW UP:  The patient is to return to see Dr. Stanford Breed on January 06, 2003,  for follow-up appointment.                                               [name], M.D.    JAM/MEDQ  D:  01/15/2003  T:  01/15/2003  Job:  [id number]

## 2011-05-04 NOTE — Consult Note (Signed)
NAME:  Emma, [name]            ACCOUNT NO.:  [id number]   MEDICAL RECORD NO.:  [id number]          PATIENT TYPE:  OUT   LOCATION:  GYN                          FACILITY:  [facility]   PHYSICIAN:  [name], M.D.    DATE OF BIRTH:  1933-06-14   DATE OF CONSULTATION:  09/18/2004  DATE OF DISCHARGE:                                   CONSULTATION   CHIEF COMPLAINT:  Follow up of uterine carcinosarcoma.   INTERVAL HISTORY:  The patient has seen Dr. Ashley [name] since our last follow  up with a normal exam.  She has noted some digestive problems comprising  increased GERD and gas bloats associated with certain foods.  She notes  some epigastric discomfort precipitated by roughage or fatty foods.  She  denies obstructive-type symptoms with nausea and vomiting, change in bowel  function, back pain, change in urinary function, pelvic pain, or vaginal  bleeding or discharge.   HISTORY OF PRESENT ILLNESS:  The patient underwent a TAH/BSO with pelvic and  aortic lymphadenectomy in January 2004 for uterine carcinosarcoma.  The  final stage was 1B with minimal myometrial invasion.  She received no  adjuvant therapy.   PAST MEDICAL HISTORY:  GERD.   PAST SURGICAL HISTORY:  1.  D&C.  2.  TAH and surgical staging for uterine sarcoma as above.   OBSTETRIC HISTORY:  NSVD x1.   MEDICATIONS:  1.  Allegra p.r.n.  2.  Prilosec.   ALLERGIES:  None.   PERSONAL SOCIAL HISTORY:   FAMILY HISTORY:   REVIEW OF SYSTEMS:  Otherwise unchanged from those recorded during intake  evaluation in January 2004.   PHYSICAL EXAMINATION:  VITAL SIGNS:  Weight 156 pounds.  Vital signs stable  and afebrile.  GENERAL:  The patient is alert and oriented x3 in no acute distress.  HEENT:  Benign with clear oropharynx.  NECK:  No thyromegaly.  LYMPH SURVEY:  No supraclavicular or inguinal lymphadenopathy.  LUNGS:  Fields are clear to percussion and auscultation.  BACK:  Nontender and there is no CVA tenderness.  ABDOMEN:  Soft and benign with a well-healed midline incision.  There is no  ascites, tenderness, mass, or organomegaly.  No incisional hernia noted.  EXTREMITIES:  Full strength and range of motion without edema.  PELVIC:  External genitalia and BUS are normal to inspection and palpation.  The bladder and urethra are well supported.  The vagina has a small  enterocele but no significant cystourethrocele.  The mucosa is atrophic and  there are no lesions.  Bimanual and rectovaginal examinations reveal an  absent uterus and cervix without mass or nodularity.   ASSESSMENT:  Uterine carcinosarcoma, no active disease on clinical  examination.   PLAN:  The patient will be undergoing her GI evaluation with Dr. Charna [name].  Assuming negative evaluation and cytology, we can begin alternating  follow up with Dr. Ashley [name] after the patient sees him in January, such that  she is examined every six months.     Emma   JTS/MEDQ  D:  09/18/2004  T:  09/18/2004  Job:  [id number]  cc:   [name], M.D.  [address]  [place], [place] [address]  Fax: [phone]   Telford Nab, R.N.  [address]  [place], [place] [address]   Anselmo Rod, M.D.  [address]  [address]  [place]  [place] [address]  Fax: [phone]

## 2011-05-04 NOTE — Discharge Summary (Signed)
NAME:  [name], [name]            ACCOUNT NO.:  [id number]   MEDICAL RECORD NO.:  [id number]          PATIENT TYPE:  INP   LOCATION:  5015                         FACILITY:  [facility]   PHYSICIAN:  Burnard Bunting, M.D.    DATE OF BIRTH:  [birth year]   DATE OF ADMISSION:  06/07/2009  DATE OF DISCHARGE:  06/11/2009                               DISCHARGE SUMMARY   ADMISSION DIAGNOSES:  1. Left hip arthritis.  2. History of ovarian cancer, status post total abdominal      hysterectomy.  3. Hypertension.  4. Hypercholesterolemia.  5. Status post right total hip arthroplasty.   DISCHARGE DIAGNOSES:  1. Left hip arthritis.  2. History of ovarian cancer, status post total abdominal      hysterectomy.  3. Hypertension.  4. Hypercholesterolemia.  5. Status post right total hip arthroplasty.  6. Acute blood loss anemia requiring blood transfusion.   PROCEDURE:  The patient underwent left total hip replacement performed  by Dr. August Saucer, assisted by Wende Neighbors, PA-C under general anesthesia  on June 07, 2009.   CONSULTATIONS:  None.   BRIEF HISTORY:  The patient is a 77 year old female with left hip pain,  which has been chronic and progressive in nature, secondary to  arthritis.  She has failed nonoperative management and wished to proceed  with a left total hip arthroplasty.   BRIEF HOSPITAL COURSE:  The patient tolerated the procedure under  general anesthesia without complications.  Postoperatively,  neurovascular motor function of the lower extremities remained intact.  She was started on physical therapy for ambulation and gait training,  weightbearing as tolerated.  She was able to follow total hip  replacement precautions.  Occupational therapy assisted with ADLs.  The  patient was independent at the time of discharge.  She was ambulating in  the hallway at the time of discharge over 200 feet.  She was started on  Coumadin for DVT prophylaxis.  Adjustments in Coumadin dose  made  according to daily Protimes by the pharmacist to [facility].  At  discharge, INR 2.5.  Dressing changes were done daily and the patient's  wound was healing without signs of infection.  Hemoglobin and hematocrit  on admission 15.0 and 45.3.  Hemoglobin and hematocrit at lowest value  7.1 at 20.5, postoperatively.  The patient received 1 unit of packed red  blood cells.  Final check of hemoglobin and hematocrit 8.9 and 26.0.  Chemistry studies within normal limits on admission and throughout the  hospital stay.  The patient did have low calcium at 7.8.  She was  treated with calcium carbonate.  Urinalysis on admission negative for  urinary tract infection and urine culture showed no growth on final  report of June 03, 2009.  Pain was controlled with IV analgesics  initially and she was weaned to p.o. analgesics without difficulty.  Prior to discharge, she was taking a regular diet.  She was voiding  after her Foley catheter was discontinued and having bowel movements.  She was stable for discharge to her home with arrangements for home  health physical therapy and durable  medical equipment.   PLAN:  The patient will follow up with Dr. August Saucer in 2 weeks from the date  of surgery.  She will call the office to arrange the appointment.  [facility] will assist with durable medical equipment and  home health physical therapy.  She will continue to be monitored for  total hip replacement precautions and will continue to be weightbearing  as tolerated utilizing a walker for ambulation.  She is instructed to  keep her wound dry and clean.  Dressing change to be done as needed at  home.   She will resume home medications as taken prior to admission and was  given medication reconciliation form with these instructions.   PRESCRIPTIONS AT DISCHARGE:  1. Percocet 5/325, one every 4-6 hours as needed for pain.  2. Coumadin 2 mg daily x4 weeks.  3. Robaxin 500 mg q.8 h. p.r.n.  spasm.  The patient was advised to      call the office should she have questions prior to her return      office visit.  All questions encouraged and answered.      Wende Neighbors, P.A.      Burnard Bunting, M.D.  Electronically Signed    SMV/MEDQ  D:  06/30/2009  T:  06/30/2009  Job:  [id number]

## 2011-05-04 NOTE — Op Note (Signed)
NAME:  [name], [name]                      ACCOUNT NO.:  [id number]   MEDICAL RECORD NO.:  [id number]                   PATIENT TYPE:  INP   LOCATION:  G956                                 FACILITY:  [facility]   PHYSICIAN:  De Blanch, M.D.         DATE OF BIRTH:  March 31, 1933   DATE OF PROCEDURE:  12/29/2002  DATE OF DISCHARGE:                                 OPERATIVE REPORT   PREOPERATIVE DIAGNOSES:  Carcinosarcoma of the uterus.   POSTOPERATIVE DIAGNOSES:  Carcinosarcoma of the uterus.   PROCEDURE:  Total abdominal hysterectomy, bilateral salpingo-oophorectomy,  pelvic and periaortic lymphadenectomy.   SURGEON:  De Blanch, M.D.   ASSISTANT:  Rudy Jew. Ashley Royalty, M.D. and Telford Nab, R.N.   ANESTHESIA:  General with oral tracheal tube.   ESTIMATED BLOOD LOSS:  About 300 cc   SURGICAL FINDINGS:  At the time of exploratory laparotomy, the liver,  spleen, stomach and omentum appeared normal. 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 There  were adhesions of the omentum to the cecum. The small bowel, appendix and  colon were normal. There were no enlarged pelvic or periaortic lymph nodes.  The uterus is normal. There were adhesions of the sigmoid colon to the left  adnexa consistent with prior pelvic inflammatory disease.   DESCRIPTION OF PROCEDURE:  The patient was brought to the operating room and  after satisfactory attainment of general anesthesia was placed in a modified  lithotomy position in [place] stirrups. The anterior abdominal wall, perineum,  and vagina were prepped with Betadine. A Foley catheter was inserted and the  patient was draped. The abdomen was entered through a midline incision which  extended around the umbilicus. Pelvic washings were obtained. The upper  abdomen and pelvis were explored with the above noted findings. The  Bookwalter retractor was positioned and the  bowel was packed out of the  pelvis. Adhesions to the left adnexa were lysed with sharp and blunt  dissection. The uterus was grasped with long Kelly clamps. The round  ligaments were divided and the retroperitoneal spaces opened. The external  iliac artery and vein, internal iliac artery and ureter were identified. The  ovarian vessels were skeletonized, clamped, cut, free tied and suture  ligated. The bladder flap was advanced with sharp and blunt dissection. The  uterine vessels were skeletonized and then clamped, cut and suture ligated  using 2-0 Vicryl. In a stepwise fashion, the paracervical and cardinal  ligaments were clamped, cut and suture ligated. The rectovaginal septum was  developed and the vaginal angles were cross clamped incorporating the  uterosacral ligaments. The vagina was transected from its junction with the  cervix. The uterus, cervix, tubes and ovaries were handed off the operative  field and submitted for frozen section revealing a polypoid endometrial  tumor which was high grade consistent with a carcinosarcoma, depth of  invasion seemed to be minimal.   The vaginal angles  were transfixed with #0 Vicryl, the central portion of  the vagina was closed with interrupted figure-of-eight sutures of #0 Vicryl.   The pararectal and paravesical spaces were further developed and  lymphadenectomy is performed in the pelvis excising lymph nodes from the  external iliac artery and vein, internal iliac artery and obturator fossa  throughout the procedure. Hemostasis was achieved with hemoclips and cautery  and care was taken to avoid vascular or neurologic injuries. Hot packs were  placed in the pelvis for hemostasis and Bookwalter retractor repositioned.  The skin incision was extended toward the xiphoid. The bowel was packed  appropriate to expose the aorta. An incision was made over the right common  iliac artery and over the aorta extending approximately 6 cm above the   origin of the inferior mesenteric artery. The ovarian vein and right ovarian  artery were identified at the apex of the dissection. The right ureter was  reflected laterally. The lymph nodes overlying the vena cava and aorta were  then excised, hemostasis being achieved with cautery and hemoclips.   Attention was then turned to the pelvis where hemostasis was ascertained.  The abdomen and pelvis were irrigated with saline. The packs and retractors  were removed and the anterior abdominal wall closed in layers the first  being a running mass closure using #1 PDS. The subcutaneous tissue was  irrigated, hemostasis achieved with cautery and the skin closed with skin  staples. A dressing was applied, the patient was awakened from anesthesia  and taken to the recovery room in satisfactory condition. Sponge, needle and  instrument counts were correct x2.                                               De Blanch, M.D.    DC/MEDQ  D:  12/29/2002  T:  12/29/2002  Job:  [id number]   cc:   Fayrene Fearing A. Ashley Royalty, M.D.  [address]  [place], [place] [address]  Fax: [phone]   Telford Nab, R.N.  [address] [place], [place] [address]  Fax: 1   Sharlot Gowda, M.D.  [phone]. [address] [place], [place] [address]  Fax: [phone]

## 2011-05-04 NOTE — Consult Note (Signed)
NAME:  [name], [name] NO.:  [id number]   MEDICAL RECORD NO.:  [id number]                   PATIENT TYPE:  OUT   LOCATION:  GYN                                  FACILITY:  [facility]   PHYSICIAN:  De Blanch, M.D.         DATE OF BIRTH:  1933/06/02   DATE OF CONSULTATION:  DATE OF DISCHARGE:                                   CONSULTATION   REASON FOR CONSULTATION:  The patient is a 77 year old African-American  female seen in consultation at the request of Dr. Sylvester Harder.   The patient initially presented with some vaginal discharge and slight pink  staining.  An ultrasound showed the patient had fluid in the endometrial  cavity.  She was subsequently seen by Dr. Ashley Royalty, who obtained an  endometrial biopsy showing a carcinosarcoma of the uterus.  She has  subsequently had a CAT scan of the abdomen and pelvis showing no evidence of  adenopathy, free fluid, or any extra pelvic disease.   The patient herself denies any constitutional symptoms.  She specifically  denies any GI or GU symptoms.  She has no pelvic pain, pressure, or any  other symptoms suggestive of a malignancy.   PAST MEDICAL HISTORY:  None.   PAST SURGICAL HISTORY:  D&C.   PAST OBSTETRICAL HISTORY:  Gravida 1.   MEDICATIONS:  Allegra p.r.n.   SOCIAL HISTORY:  The patient is married.  She works as a Advertising copywriter.  She  has one living child, and does not smoke.   ALLERGIES:  No known drug allergies.   REVIEW OF SYMPTOMS:  Negative, except as noted above.   PHYSICAL EXAMINATION:  VITAL SIGNS:  Height 5 feet 1 inch, weight 150  pounds, blood pressure 140/88, pulse 100, respiratory rate 20.  GENERAL:  The patient is a slightly anxious African-American female in no  acute distress.  HEENT:  Negative.  NECK:  Supple without thyromegaly.  There is no supraclavicular or inguinal  adenopathy.  ABDOMEN:  Soft, nontender, no masses, organomegaly, ascites, or hernias are  noted.  PELVIC:  EGBUS, vagina, bladder, and urethra are normal.  Cervix is normal.  She does seem to have a large enterocele, however.  Uterus is anterior.  Normal shape, size, and consistency.  There are no adnexal masses noted.  Rectovaginal examination confirms.  EXTREMITIES:  The lower extremities are without edema or varicosities.   IMPRESSION:  Carcinosarcoma of the uterus.   PLAN:  I would recommend the patient undergo total abdominal hysterectomy,  bilateral salpingo-oophorectomy, and surgical staging to include partial  omentectomy and pelvic and peri-aortic lymphadenectomy.  I explained to the  patient that based on surgical and pathologic findings, we may or may not  recommend any additional therapies such as radiation therapy or  chemotherapy.  The risks of surgery, including hemorrhage, infection, injury  to adjacent viscera, thromboembolic complications, and anesthetic risks were  outlined.  All the patient's questions were answered, and  she wishes to  proceed with surgery which will be scheduled for next week in conjunction  with Dr. Sylvester Harder.                                                De Blanch, M.D.    DC/MEDQ  D:  12/22/2002  T:  12/22/2002  Job:  [id number]   cc:   Fayrene Fearing A. Ashley Royalty, M.D.  [address]  [place], [place] [address]  Fax: [phone]   Telford Nab, R.N.  [address] [place], [place] [address]  Fax: 1   Sharlot Gowda, M.D.  [phone]. [address] [place], [place] [address]  Fax: [phone]

## 2011-05-04 NOTE — Discharge Summary (Signed)
NAME:  BRECK, HOLLINGER            ACCOUNT NO.:  [id number]   MEDICAL RECORD NO.:  [id number]          PATIENT TYPE:  INP   LOCATION:  5004                         FACILITY:  [facility]   PHYSICIAN:  Burnard Bunting, M.D.    DATE OF BIRTH:  09-11-1933   DATE OF ADMISSION:  07/29/2007  DATE OF DISCHARGE:  08/02/2007                               DISCHARGE SUMMARY   DISCHARGE DIAGNOSIS:  Right hip arthritis.   SECONDARY DIAGNOSES:  1. Hypertension.  2. History of pelvic cancer.   OPERATION/PROCEDURE:  Right total hip replacement, performed July 29, 2007.   HOSPITAL COURSE:  [name] is a 77 year old female with end-  stage right hip arthritis.  She underwent right total hip replacement on  July 29, 2007, without complications.  She tolerated the procedure  well without immediate complications.  She was transferred to the  orthopedic floor.  On postop day number 1, leg wounds were equal.  Dorsiflexion and plantar flexion was intact at that time.  Creatinine  was 0.82 and hemoglobin was 10.0.  She was immobilized with physical  therapy and started on Coumadin for DVT prophylaxis.  The patient  progressed well with physical therapy and was ambulating in the hall  with walker assist by postop day number 3.  INR is therapeutic by postop  day number 4.  She had an, otherwise, unremarkable recovery.  Incision  was intact on postop day number 3.  She was discharged home in good  condition on August 02, 2007.   DISCHARGE MEDICATIONS:  Include:  1. Atenolol 25 mg p.o. daily.  2. Multivitamin.  3. Coumadin 5 mg p.o. daily with an INR 2.0 to 2.5.  4. Percocet 1 to 2 p.o. q.2 to 4 hours p.r.n. pain.  5. Robaxin 500 mg p.o. q.8 hours as muscle relaxers.   FOLLOWUP:  She will follow up with me in 7 days for suture removal.  Continue weightbearing as tolerated and have in-house home health  physical therapy.      Burnard Bunting, M.D.  Electronically Signed     GSD/MEDQ  D:   10/22/2007  T:  10/23/2007  Job:  [id number]

## 2011-05-04 NOTE — Consult Note (Signed)
NAME:  [name], [name]            ACCOUNT NO.:  [id number]   MEDICAL RECORD NO.:  [id number]          PATIENT TYPE:  OUT   LOCATION:  GYN                          FACILITY:  [facility]   PHYSICIAN:  Paola A. Duard Brady, MD    DATE OF BIRTH:  1933/03/22   DATE OF CONSULTATION:  05/15/2005  DATE OF DISCHARGE:                                   CONSULTATION   HISTORY OF PRESENT ILLNESS:  [name] is a very pleasant 77 year old who  was diagnosed with a stage IB carcinosarcoma of the uterus, status post TAH-  BSO of pelvic and periaortic lymphadenectomy in January of 2004.  Washings  were negative.  Depth of invasion was described as a rare focus.  She was  last seen by Dr. Ronita Hipps in October of 2005, at which time her exam and  Pap smear were unremarkable.  She was seen by Dr. Sylvester Harder in February  of 2006 and per her exam, her evaluation and Pap smear were also negative.  She was overall doing quite well and denies any significant complaints.   REVIEW OF SYSTEMS:  She denies any chest pain, shortness of breath, nausea,  vomiting, fevers, chills, headaches, or visual changes.  She cares for her  two young grandsons who are ages 23 and 1 without any difficulties.  Her  energy level is good.  She denies any early satiety.   MEDICATIONS:  Atenolol.   HEALTH MAINTENANCE:  She had a mammogram last week.   ALLERGIES:  None.   PHYSICAL EXAMINATION:  VITAL SIGNS:  Weight 156 pounds, blood pressure  110/70.  GENERAL:  Well-nourished, well-developed female in no acute distress.  NECK:  Supple.  There is no lymphadenopathy, no thyromegaly.  LUNGS:  Clear to auscultation bilaterally.  CARDIOVASCULAR:  Regular rate and rhythm.  ABDOMEN:  well healed vertical skin incision.  There is no evidence of an  incisional hernia.  ABDOMEN:  Soft, nontender, nondistended.  There is no palpable mass or  hepatosplenomegaly.  Groins are negative for adenopathy.  EXTREMITIES:  There is no edema.  PELVIC:   External genitalia are within normal limits, though atrophic.  The  vagina is visualized.  There are no visible lesions.  The vaginal cuff is  negative and clean. Pap was submitted without difficulty.  Bimanual  examination reveals no masses or nodularity.  Rectal confirms.  There is no  stool in the vault for guaiac.   ASSESSMENT:  A 78 year old with a stage IB carcinosarcoma of the uterus, no  evidence of recurrent disease.   PLAN:  1. We will follow up the results of her Pap smear from today.  2. She will be seen by Dr. Ashley Royalty in six months and return to see [place] in      one year, at which point she will have every six month evaluations.        PAG/MEDQ  D:  05/15/2005  T:  05/15/2005  Job:  [id number]   cc:   Fayrene Fearing A. Ashley Royalty, M.D.  [address]  [place], [place] [address]  Fax: [phone]  Telford Nab, R.N.  [address]  [place], [place] [address]   Anselmo Rod, M.D.  [address]  [place]  [place] [address]  Fax: [phone]

## 2011-05-04 NOTE — Consult Note (Signed)
   NAME:  [name], [name]                      ACCOUNT NO.:  [id number]   MEDICAL RECORD NO.:  [id number]                   PATIENT TYPE:  OUT   LOCATION:  GYN                                  FACILITY:  [facility]   PHYSICIAN:  De Blanch, M.D.         DATE OF BIRTH:  01-Jan-1933   DATE OF CONSULTATION:  08/31/2003  DATE OF DISCHARGE:                                   CONSULTATION   REASON FOR CONSULTATION:  This 77 year old African-American female returns  for continuous followup of carcinosarcoma of the uterus, stage 1-B.   INTERVAL HISTORY:  Since her last visit, the patient has done well. She has  seen Dr. Ashley Royalty and had a good examination at that time. Today, she  specifically denies any GI or GU symptoms. Has no pelvic pain, pressure,  vaginal bleeding, or discharge. Her functional status is excellent.   HISTORY OF PRESENT ILLNESS:  The patient underwent a total abdominal  hysterectomy, bilateral salpingo-oophorectomy, pelvic and periaortic  lymphadenectomy January of 2004 for a uterine carcinosarcoma. She has stage  1-B disease with only 0.2 cm invasion. No adjuvant therapy was recommended.   PAST MEDICAL HISTORY:  None.   PAST SURGICAL HISTORY:  D&C, TAH, BSO, and nodes.   OBSTETRICAL HISTORY:  Gravida 1.   MEDICATIONS:  Allegra p.r.n.   SOCIAL HISTORY:  The patient is married. 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   ALLERGIES:  None.   REVIEW OF SYSTEMS:  Negative except as noted above.   PHYSICAL EXAMINATION:  VITAL SIGNS: Weight 154 pounds. Blood pressure  140/84.  GENERAL: The patient is a healthy, African-American female in no acute  distress.  HEENT: Negative.  NECK: Supple without thyromegaly. There is no supraclavicular or inguinal  adenopathy.  ABDOMEN: Soft. Nontender. No mass, organomegaly, ascites, or hernias noted.  Midline incision is well healed.  PELVIC: EG, BUS, vagina, bladder, and urethra are normal except for large  cystocele and rectocele. No lesions are noted. Pap smears are obtained.  Bimanual and rectovaginal examination reveal no masses, induration, or  nodularity.   IMPRESSION:  Stage I-B carcinosarcoma of the uterus. No evidence of  recurrent disease.    PLAN:  The patient to return to see Dr. Ashley Royalty in December and see [place] in  April of 2005. Pap smears are obtained today.                                               De Blanch, M.D.    DC/MEDQ  D:  08/31/2003  T:  08/31/2003  Job:  [id number]   cc:   Telford Nab, R.N.  [address]  [place], [place] [address]   Rudy Jew. Ashley Royalty, M.D.  [address]  [place], [place] [address]  Fax: [phone]

## 2011-05-04 NOTE — Consult Note (Signed)
NAME:  [name], [name]                      ACCOUNT NO.:  [id number]   MEDICAL RECORD NO.:  [id number]                   PATIENT TYPE:  OUT   LOCATION:  GYN                                  [facility]ACILITY:  [facility]   PHYSICIAN:  De Blanch, M.D.         DATE OF BIRTH:  May 07, 1933   DATE OF CONSULTATION:  DATE OF DISCHARGE:                                   CONSULTATION   This is a 77 year old African American female seen in followup for stage IB  carcinosarcoma of the uterus.   INTERVAL HISTORY:  Since her last visit the patient has done well. She saw  Dr. Francene Boyers approximately three months ago and his report shows that  she was doing well and had no abnormal findings.   The patient herself denies any GI or GU symptoms. She has no pelvic pain or  pressure, vaginal bleeding or discharge. Her functional status is excellent.   HISTORY OF PRESENT ILLNESS:  The patient underwent TAH, BSO, pelvic and  periaortic lymphadenectomy in January 2004 for uterine sarcoma. Final  staging was stage IB with 0.2 cm of invasion. She received no adjuvant  therapy.   PAST MEDICAL HISTORY:  Medical illnesses: None.   PAST SURGICAL HISTORY:  D&C, TAH, BSO, and pelvic and periaortic  lymphadenectomy.   OBSTETRIC HISTORY:  Gravida 1.   MEDICATIONS:  Allegra p.r.n.   SOCIAL HISTORY:  The patient is married. 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   DRUG ALLERGIES:  None.   REVIEW OF SYSTEMS:  Negative.   PHYSICAL EXAMINATION:  VITAL SIGNS: Weight 160 pounds, blood pressure  110/62.  GENERAL: The patient is a healthy African American female in no acute  distress.  HEENT: Negative.  NECK: Supple without thyromegaly. There is no supraclavicular or inguinal  adenopathy.  ABDOMEN: Soft, nontender. No masses, organomegaly, ascites, or hernias  noted. Midline incision is well healed. She has a large keloid.  PELVIC EXAM: EGBUS, vagina, bladder, and urethra are normal, except  for  redundancy of the vagina and relaxation of the lateral side walls. The  uterus and cervix are surgically absent. Bimanual and rectovaginal exam  reveal no masses, induration, or nodularity.  EXTREMITIES: Lower extremities are without edema or varicosities.   IMPRESSION:  Stage IB carcinosarcoma of the uterus, January 2004. No  evidence of recurrent disease.   PLAN:  The patient is to return to see Dr. Sylvester Harder in four months and  return to see [place] in eight months.                                               De Blanch, M.D.    DC/MEDQ  D:  02/22/2004  T:  02/22/2004  Job:  [id number]   cc:   Fayrene Fearing A. Ashley Royalty, M.D.  [phone]  [place], [place] [address]  Fax: [phone]   Telford Nab, R.N.  [address]  [place], [place] [address]

## 2011-05-04 NOTE — Op Note (Signed)
NAME:  [name], [name]            ACCOUNT NO.:  [id number]   MEDICAL RECORD NO.:  [id number]          PATIENT TYPE:  AMB   LOCATION:  ENDO                         FACILITY:  [facility]   PHYSICIAN:  Jordan Hawks. Elnoria Howard, MD    DATE OF BIRTH:  1933/07/22   DATE OF PROCEDURE:  10/26/2004  DATE OF DISCHARGE:                                 OPERATIVE REPORT   PROCEDURE:  Esophagogastroduodenoscopy.   ENDOSCOPIST:  Jordan Hawks. Elnoria Howard, M.D.   INDICATIONS:  This is for epigastric pain and symptoms of gastroesophageal  reflux disease.   CONSENT:  Informed consent was obtained from the patient describing the  risks of bleeding, infection, perforation, medication reactions and the  risks of death; all of which are not exclusive of any other complications  that can occur.   PHYSICAL EXAMINATION:  CARDIAC:  Regular rate and rhythm.  LUNGS:  Clear to auscultation bilaterally.  ABDOMEN:  Flat, soft, nontender, nondistended.   EQUIPMENT USED:  Adult Olympus endoscope.   MEDICATIONS:  Versed 6 mg IV and Demerol 60 mg IV.   DESCRIPTION OF PROCEDURE:  After adequate sedation was achieved, the  endoscope was advanced from the oral cavity to the proximal duodenum.  Photo  documentation of the duodenum was obtained.  Upon slow retraction of the  endoscope, there was no evidence of any abnormalities within the duodenum.  With inspection of the gastric lumen, the patient was noted to have atrophic  gastritis, no evidence of any ulcerations, inflammation, erosions, or  masses. Retroflexion was negative for any abnormalities in the cardia  region.  The endoscope was then straightened and withdrawn into the  esophagus.  No evidence of any esophagitis or other abnormalities, no  evidence of hiatal  hernia. The endoscope was then withdrawn from the patient and the procedure  was terminated.  No complications were encountered.   PLAN:  Continue with Protonix which has helped her symptoms. Follow-up on a  p.r.n.  basis.       PDH/MEDQ  D:  10/26/2004  T:  10/26/2004  Job:  [id number]   cc:   Sharlot Gowda, M.D.  [address]  [place], [place] [address]  Fax: [phone]

## 2011-05-04 NOTE — H&P (Signed)
NAME:  ADONNA, HORSLEY                      ACCOUNT NO.:  [id number]   MEDICAL RECORD NO.:  [id number]                   PATIENT TYPE:  OUT   LOCATION:  GYN                                  FACILITY:  [facility]   PHYSICIAN:  John T. Kyla Balzarine, M.D.                 DATE OF BIRTH:  [birth year]   DATE OF ADMISSION:  03/03/2003  DATE OF DISCHARGE:                                HISTORY & PHYSICAL   CHIEF COMPLAINT:  Followup of uterine carcinosarcoma.   INTERVAL HISTORY:  The patient had a negative examination by Dr. Ashley Royalty  approximately six weeks ago.  She has returned to full function and denies  pain, leg swelling, vaginal bleeding.  Bowel and bladder functions are  normal.   HISTORY OF PRESENT ILLNESS:  The patient underwent TAH/BSO and nodes on  December 29, 2002, for a uterine carcinosarcoma.  She had a stage IB mixed  mullerian tumor with negative nodes, washings, cervix, and adnexa with tumor  invading 0.2 cm out of the 16 cm wall thickness.   PAST MEDICAL HISTORY:  Not remarkable.   FAMILY HISTORY, PERSONAL SOCIAL HISTORY, AND REVIEW OF SYSTEMS:  Are  unchanged from those recorded during her admission in January.   PHYSICAL EXAMINATION:  VITAL SIGNS:  Weight 148 pounds, blood pressure  120/68.  Vital signs stable.  GENERAL:  The patient is alert and oriented x3, in no acute distress.  HEENT:  Benign.  Negative oropharynx.  LYMPH:  There is no pathologic lymphadenopathy.  BACK:  There is no back or CVA tenderness.  ABDOMEN:  Soft, benign with well-healed incision.  There is no ascites,  mass, or organomegaly.  EXTREMITIES:  Full range of motion and strength without edema.  PELVIC:  External genitalia and BUS are normal to inspection/palpation.  A  third degree rectocele is present.  Vaginal cuff is well healed.  There is  good anterior support.  Bimanual and rectovaginal examinations reveal  minimal postoperative induration, absent uterus and cervix.   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    PLAN:  The patient will see Dr. Ashley Royalty for followup in approximately three  months and return to see [place] in six months.  Cytology obtained today and will  be communicated to the patient.                                               John T. Kyla Balzarine, M.D.    JTS/MEDQ  D:  03/03/2003  T:  03/03/2003  Job:  [id number]   cc:   Telford Nab, R.N.   Rudy Jew. Ashley Royalty, M.D.  [address]  [place], [place] [address]  Fax: [phone]   Sharlot Gowda, M.D.  [address]  [place], [place] [address]  Fax:  [phone]  

## 2011-05-04 NOTE — Op Note (Signed)
NAME:  [name], [name]            ACCOUNT NO.:  [id number]   MEDICAL RECORD NO.:  [id number]          PATIENT TYPE:  AMB   LOCATION:  ENDO                         FACILITY:  [facility]   PHYSICIAN:  Jordan Hawks. Elnoria Howard, MD    DATE OF BIRTH:  1933-03-18   DATE OF PROCEDURE:  10/26/2004  DATE OF DISCHARGE:                                 OPERATIVE REPORT   PROCEDURE:  Colonoscopy.   INDICATIONS:  For screening colonoscopy.   ENDOSCOPIST:  Jordan Hawks. Elnoria Howard, M.D.   EQUIPMENT USED:  Adult Olympus colonoscope.   CONSENT:  Informed consent was obtained from the patient describing the  risks of bleeding, infection, perforation, medication reactions, a 10% miss  rate for a small colon cancer or polyp, and the risk of death, all of which  are not exclusive of any other complications that may occur.   PHYSICAL EXAMINATION:  CARDIAC:  Regular rate and rhythm.  LUNGS:  Clear to auscultation bilaterally.  ABDOMEN:  Flat, soft, nontender, nondistended.   MEDICATIONS:  Previously medicated for her EGD.   PROCEDURE:  After the EGD was performed, the patient was positioned for the  colonoscopy.  A rectal examination was performed which was negative for any  palpable abnormalities.  The colonoscope was then introduced and advanced  under direct visualization to the cecum.  The patient was noted to have a  tortuous and redundant colon, making the intubation of the cecum difficult.  Upon inspection of the cecum, photodocumentation was obtained of the  appendiceal orifice, and close inspection of the cecum was obtained with no  identifiable abnormalities.  Upon withdrawal of the colonoscope into the  ascending colon, a 3 mm sessile polyp was detected and removed with cold  biopsy.  The endoscope was then slowly withdrawn, and close inspection of  the mucosa was performed of the ascending, transverse, descending, and  sigmoid colon.  There was no evidence of any polyps, inflammation,  ulcerations, erosions,  or diverticula noted.  Upon entry into the rectum, a  retroflexion was performed, and there was no evidence of any abnormalities,  no clear evidence of any hemorrhoids.  The patient tolerated the procedure  well.  No complications were encountered.   PLAN AT THIS TIME:  1.  Follow up on biopsies.  2.  Repeat colonoscopy in five years.       PDH/MEDQ  D:  10/26/2004  T:  10/26/2004  Job:  [id number]   cc:   Sharlot Gowda, M.D.  [address]  [place], [place] [address]  Fax: [phone]

## 2011-05-04 NOTE — Consult Note (Signed)
NAME:  [name], [name]                      ACCOUNT NO.:  [id number]   MEDICAL RECORD NO.:  [id number]                   PATIENT TYPE:  OUT   LOCATION:  GYN                                  FACILITY:  [facility]   PHYSICIAN:  John T. Kyla Balzarine, M.D.                 DATE OF BIRTH:  [birth year]   DATE OF CONSULTATION:  01/05/2003  DATE OF DISCHARGE:                                   CONSULTATION   CHIEF COMPLAINT:  Follow-up postoperative after surgery for uterine  carcinosarcoma.   HISTORY OF PRESENT ILLNESS:  This 77 year old woman underwent TAH/BSO and  nodes on December 29, 2002, for uterine carcinosarcoma.  Final pathology  revealed stage IB mixed mullerian/carcinosarcoma with negative nodes,  washings, cervix, and adnexa.  Tumor invaded a maximal 0.2 cm out of a 1.6  cm thickness wall.  There was no lymphovascular involvement.  She was  discharged on schedule from the hospital and has had an unremarkable  convalescence at home.  She denies fever or chills, erythema, or drainage  from the incision.  She had removal of some of her staples while she was  inpatient.   PHYSICAL EXAMINATION:  VITAL SIGNS:  Blood pressure 135/88.  Weight 150  pounds.  ABDOMEN:  Soft and benign, without ascites or mass.  Midline incision is  healing well, without erythema or undue tenderness.  There is no ascites or  mass.   LABORATORY DATA:  I reviewed pathology results as above, and these have been  discussed with Dr. Stanford Breed.   ASSESSMENT:  Stage IB carcinosarcoma of the uterus, treated surgically.   PLAN:  Both Dr. Stanford Breed and I believe that this patient is unlikely  to benefit from radiation or conventional chemotherapy.  In the absence of a  clinical trial, we would recommend observation at three-month intervals for  the first year after surgery and then at six-month intervals to complete  five years of follow-up.  The patient does not wish participation on  protocol and is amenable  to observation without treatment.  We would  recommend alternating follow-up with Dr. Ashley Royalty; the patient will see him  for follow-up in five or six weeks and then will return to see the  GYN/oncology service for follow-up in about three months.                                               John T. Kyla Balzarine, M.D.    JTS/MEDQ  D:  01/05/2003  T:  01/05/2003  Job:  [id number]   cc:   Fayrene Fearing A. Ashley Royalty, M.D.  [address]  [place], [place] [address]  Fax: [phone]   Sharlot Gowda, M.D.  [address]  [place], [place] [address]  Fax: [phone]   Telford Nab, R.N.  [address] [place], [place] [address]  Fax: 1

## 2011-05-04 NOTE — Consult Note (Signed)
NAME:  [name], [name]            ACCOUNT NO.:  [id number]   MEDICAL RECORD NO.:  [id number]          PATIENT TYPE:  OUT   LOCATION:  GYN                          FACILITY:  [facility]   PHYSICIAN:  John T. Kyla Balzarine, M.D.    DATE OF BIRTH:  08/20/1933   DATE OF CONSULTATION:  05/22/2006  DATE OF DISCHARGE:                                   CONSULTATION   CHIEF COMPLAINT:  Follow-up of uterine carcinosarcoma.   HISTORY OF PRESENT ILLNESS:  The patient underwent TAH BSO and comprehensive  staging with washings and lymphadenectomy in January 2004.  She had rare  focal myometrial invasion only, and received no further treatment.  Follow-  up has been alternating between Dr. Ashley Royalty and our unit, with last  examination here in May 2006.  The patient remains full activity, denying  pelvic pain, vaginal bleeding or leg swelling.  She is due for her annual  mammogram in the near future.   PAST MEDICAL HISTORY:  1.  GERD.  2.  Status post TAH and surgical staging and prior D&C.  3.  She is status post NSVD times one.   MEDICATIONS:  Atenolol.   ALLERGIES:  NONE.   PERSONAL/SOCIAL HISTORY AND FAMILY HISTORY:  Noncontributory.   REVIEW OF SYSTEMS:  Otherwise negative and 10 comprehensive systems  examined.   PHYSICAL EXAMINATION:  VITAL SIGNS:  Weight 161 pounds (stable).  GENERAL:  The patient is anxious, alert and oriented x3, in no acute  distress.  LYMPH SURVEY:  Negative for pathologic lymphadenopathy.  LUNG FIELDS:  Clear to percussion and auscultation.  BACK:  Negative for pathologic lymphadenopathy.  The abdomen is soft and benign with well-healed midline incision.  No  hernia, ascites, mass or tenderness.  Extremities have full strength and  range of motion with no edema.  PELVIC:  External genitalia and BUS normal on inspection and palpation.  Bladder and urethra were well supported and the vaginal mucosa is clear.  Bimanual and rectovaginal examinations disclose absent uterus  and cervix  with no mass or nodularity.   ASSESSMENT:  Uterine carcinosarcoma, clinically NAD.   PLAN:  Patient reassured regarding current status.  We can continue follow-  up as per prior with Dr. Ashley Royalty at 27-month intervals such that she returns  to see [place] in one year.      John T. Kyla Balzarine, M.D.  Electronically Signed     JTS/MEDQ  D:  05/22/2006  T:  05/22/2006  Job:  [id number]

## 2011-05-31 ENCOUNTER — Other Ambulatory Visit: Payer: Self-pay | Admitting: Family Medicine

## 2011-05-31 MED ORDER — ATENOLOL 50 MG PO TABS: 50.0000 mg | ORAL_TABLET | Freq: Every day | ORAL | Status: DC

## 2011-06-03 ENCOUNTER — Other Ambulatory Visit: Payer: Self-pay | Admitting: Family Medicine

## 2011-06-04 ENCOUNTER — Ambulatory Visit (INDEPENDENT_AMBULATORY_CARE_PROVIDER_SITE_OTHER): Payer: Medicare Other | Admitting: Medical

## 2011-06-04 ENCOUNTER — Encounter: Payer: Self-pay | Admitting: Medical

## 2011-06-04 ENCOUNTER — Other Ambulatory Visit (HOSPITAL_COMMUNITY): Admission: RE | Admit: 2011-06-04 | Payer: Medicare Other | Source: Ambulatory Visit

## 2011-06-04 VITALS — BP 120/80 | HR 61 | Temp 98.5°F | Ht 62.0 in | Wt 158.0 lb

## 2011-06-04 DIAGNOSIS — J309 Allergic rhinitis, unspecified: Secondary | ICD-10-CM

## 2011-06-04 DIAGNOSIS — Z124 Encounter for screening for malignant neoplasm of cervix: Secondary | ICD-10-CM | POA: Insufficient documentation

## 2011-06-04 DIAGNOSIS — N760 Acute vaginitis: Secondary | ICD-10-CM

## 2011-06-04 DIAGNOSIS — I10 Essential (primary) hypertension: Secondary | ICD-10-CM

## 2011-06-04 DIAGNOSIS — E785 Hyperlipidemia, unspecified: Secondary | ICD-10-CM

## 2011-06-04 LAB — COMPREHENSIVE METABOLIC PANEL
ALT: 15 U/L (ref 0–35)
Albumin: 4.5 g/dL (ref 3.5–5.2)
CO2: 25 mEq/L (ref 19–32)
Calcium: 10 mg/dL (ref 8.4–10.5)
Chloride: 103 mEq/L (ref 96–112)
Creat: 0.94 mg/dL (ref 0.50–1.10)
Potassium: 4.3 mEq/L (ref 3.5–5.3)
Sodium: 139 mEq/L (ref 135–145)
Total Protein: 8.5 g/dL — ABNORMAL HIGH (ref 6.0–8.3)

## 2011-06-04 LAB — POCT URINALYSIS DIPSTICK
Bilirubin, UA: NEGATIVE
Blood, UA: NEGATIVE
Glucose, UA: NEGATIVE
Ketones, UA: NEGATIVE
Nitrite, UA: NEGATIVE
pH, UA: 5

## 2011-06-04 LAB — LIPID PANEL
HDL: 50 mg/dL (ref >39)
Triglycerides: 150 mg/dL — ABNORMAL HIGH (ref <150)

## 2011-06-04 MED ORDER — TRIAMCINOLONE ACETONIDE 0.1 % EX CREA: TOPICAL_CREAM | Freq: Two times a day (BID) | CUTANEOUS | Status: DC

## 2011-06-04 NOTE — Patient Instructions (Signed)

## 2011-06-04 NOTE — Progress Notes (Signed)
Subjective:   HPI  [name] is a 77 y.o. female who presents for complete physical and recheck on chronic medical problems.    Here for recheck on HTN.  Controlled on current medication without c/o.  Here for recheck on lipids, no c/o, no myalgias or med side effects.  Only c/o currently is request to refill triamcinolone cream.  She notes some skin irritation to the left of her vagina intermittent.  Has used triamcinolone cream prior with relief.  No vaginal discharge, no blisters, non tender, no bumps.  Otherwise no c/o and in normal state of health. Needs meds refilled. Last pap smear 2 years ago normal, and she wants this repeated today.  Last colonoscopy 2010.  She notes seeing cardiology for stress test last year.  Sees cardiology every 3 mo.   Past Medical History  Diagnosis Date  . Anxiety   . Allergy   . Dyslipidemia   . GERD (gastroesophageal reflux disease)   . Hypertension   . Lactose intolerance   . Menopausal problem   . History of uterine cancer 12/2002    s/p hysterectomy  . Cataract   . Palpitations     followed by Dr. Alanda Amass, [facility]  . Lown Maryla Morrow syndrome     Past Surgical History  Procedure Date  . Abdominal hysterectomy   . Colonoscopy 10/06/09    scattered diverticla, medium hemorrhoids  . Total hip arthroplasty 2008 2010     twice left (complication on the left, once on the right    Current Outpatient Prescriptions on File Prior to Visit  Medication Sig Dispense Refill  . aspirin 81 MG tablet Take 81 mg by mouth daily.        Marland Kitchen atenolol (TENORMIN) 50 MG tablet Take 1 tablet (50 mg total) by mouth daily.  90 tablet  4  . lisinopril-hydrochlorothiazide (PRINZIDE,ZESTORETIC) 10-12.5 MG per tablet TAKE 1 TABLET DAILY  90 tablet  2  . busPIRone (BUSPAR) 5 MG tablet Take 5 mg by mouth 3 (three) times daily.        . fexofenadine (ALLEGRA) 180 MG tablet Take 180 mg by mouth daily.        . flecainide (TAMBOCOR) 50 MG tablet Take 50 mg by mouth  2 (two) times daily.          No Known Allergies    Review of Systems Constitutional: denies fever, chills, sweats, unexpected weight change, anorexia, fatigue Allergy: negative; denies recent sneezing, itching, congestion Dermatology: +skin irration left of vagina; denies changing moles, lumps, new worrisome lesions ENT: no runny nose, ear pain, sore throat, hoarseness, sinus pain, teeth pain, tinnitus, hearing loss, epistaxis Cardiology: denies chest pain, palpitations, edema, orthopnea, paroxysmal nocturnal dyspnea Respiratory: denies cough, shortness of breath, dyspnea on exertion, wheezing, hemoptysis Gastroenterology: denies abdominal pain, nausea, vomiting, diarrhea, constipation, blood in stool, changes in bowel movement, dysphagia Hematology: denies bleeding or bruising problems Musculoskeletal: denies arthralgias, myalgias, joint swelling, back pain, neck pain, cramping, gait changes Ophthalmology: denies vision changes, eye redness, itching, discharge Urology: denies dysuria, difficulty urinating, hematuria, urinary frequency, urgency, incontinence Neurology: no headache, weakness, tingling, numbness, speech abnormality, memory loss, falls, dizziness Psychology: denies depressed mood, agitation, sleep problems     Objective:   Physical Exam  General appearance: alert, no distress, WD/WN, African American female, looks stated age Skin: left forearm linear scar, left ring fingernail with half new tissue, half missing nail, great toenails somewhat thickened, otherwise skin and nails unremarkable HEENT: normocephalic, conjunctiva/corneas normal,  sclerae anicteric, PERRLA, EOMi, nares patent, no discharge or erythema, pharynx normal Oral cavity: MMM, tongue normal, upper and lower dentures Neck: supple, no lymphadenopathy, no thyromegaly, no masses, normal ROM, no JVD, no bruits Breasts: non tender, no mass, no axillary lymphenopathy, no peau d' orange or skin changes Heart:  RRR, normal S1, S2, no murmurs Lungs: CTA bilaterally, no wheezes, rhonchi, or rales Abdomen: +bs, soft,vertical surgical scar, non tender, non distended, no masses, no hepatomegaly, no splenomegaly, no bruits Back: non tender, normal ROM, no scoliosis Musculoskeletal: upper extremities non tender, no obvious deformity, normal ROM throughout, lower extremities non tender, no obvious deformity, bilat internal ROM reduced, otherwise normal ROM throughout Extremities: no edema, no cyanosis, no clubbing Pulses: 2+ symmetric, upper and lower extremities, normal cap refill Neurological: alert, oriented x 3, CN2-12 intact, strength normal upper extremities and lower extremities, sensation normal throughout, DTRs 2+ throughout, no cerebellar signs, gait normal Pelvic - no obvious external lesions, mild atrophic changes of vulva, mucosa somewhat dry, no vaginal discharge, s/p hysterectomy, spatula sample taken for pap smear Rectal - anus normal, guaiac negative, no hemorrhoids or other abnormality Psychiatric: normal affect, behavior normal, pleasant    Assessment :    Encounter Diagnoses  Name Primary?  . Essential hypertension, benign Yes  . Hyperlipidemia   . Vaginitis   . Allergic rhinitis      Plan:    Discussed preventive care, c/t healthy diet, regular exercise.  She will schedule mammogram soon.  It hasn't been quite a year.  She is UTD on colonoscopy.  HTN - controlled on current medication.  Hyperlipidemia - been controlled, labs today.  Vaginitis - atrophic etiology.   Discussed premarin cream.  She wants to hold off on this for now.  Allergic rhinitis - c/t OTC Zyrtec  Dermatitis - no obvious rash in the vulvar region, so I question atrophic vaginitis as her main issue.  However, she suggests irritation of the peri vulvar region improved prior with triamcinolone. Thus, refilled triamcinolone cream for short term external use.    Reviewed prior labs, progress notes, colonoscopy  report.

## 2011-06-05 LAB — CBC WITH DIFFERENTIAL/PLATELET
Basophils Absolute: 0 10*3/uL (ref 0.0–0.1)
Basophils Relative: 0 % (ref 0–1)
Lymphocytes Relative: 39 % (ref 12–46)
Neutro Abs: 2.4 10*3/uL (ref 1.7–7.7)
Neutrophils Relative %: 48 % (ref 43–77)
Platelets: 216 10*3/uL (ref 150–400)
RDW: 15 % (ref 11.5–15.5)
WBC: 5 10*3/uL (ref 4.0–10.5)

## 2011-06-07 ENCOUNTER — Telehealth: Payer: Self-pay | Admitting: *Deleted

## 2011-06-07 LAB — URINE CULTURE

## 2011-06-07 NOTE — Telephone Encounter (Addendum)
Message copied by Dorthula Perfect on Thu Jun 07, 2011  8:22 AM ------      Message from: Jac Canavan      Created: Tue Jun 05, 2011  8:43 PM       pls pull chart for my review before we call about results.            Results - total protein is elevated.  Otherwise liver, kidney, electrolytes, blood counts, cholesterol all look good.    Pt notified of lab results.  CM, LPN

## 2011-06-08 ENCOUNTER — Other Ambulatory Visit: Payer: Self-pay | Admitting: *Deleted

## 2011-06-11 ENCOUNTER — Telehealth: Payer: Self-pay | Admitting: *Deleted

## 2011-06-11 NOTE — Telephone Encounter (Addendum)
Message copied by Dorthula Perfect on Mon Jun 11, 2011  8:07 AM ------      Message from: [place], DAVID S      Created: Fri Jun 08, 2011  1:20 PM       Urine culture was + for infection.  She had bacteria in the urine when she was here.  Lets call out Macrobid 100mg , 1 tablet po BID x 7 days #14, no refill.  Put order in chart as well.  I'm still looking over her other lab results and we will call on these once I've reviewed them.   Pt notified of urine results.  Called in Macrobid 100 mg 1 po BID x 7 days # 14 with 0 refills to [facility] at [phone].  CM LPN

## 2011-06-12 ENCOUNTER — Telehealth: Payer: Self-pay | Admitting: *Deleted

## 2011-06-12 NOTE — Telephone Encounter (Addendum)
Message copied by Dorthula Perfect on Tue Jun 12, 2011  9:27 AM ------      Message from: Aleen Campi, DAVID S      Created: Mon Jun 11, 2011  8:18 PM       Pap smear normal, blood count normal, liver, kidney, lytes normal.  Cholesterol looks ok.  C/t same meds.  Her labs did show elevated protein in the serum.  This has been elevated before.  Does she recall what doctors said in the past about the protein?  Lets repeat CMP in 42mo.  If protein still elevated, we may need to do additional labs.    Pt called and informed of all lab results.  Pt states that protein has been elevated in the past but doctors said nothing to be concerned about.  Will schedule an appointment to come back in to repeat CMP in 1 month.  Will also be coming in to repeat ua when antibiotics are finished.  CM, LPN

## 2011-06-29 ENCOUNTER — Other Ambulatory Visit: Payer: Self-pay | Admitting: *Deleted

## 2011-06-29 ENCOUNTER — Other Ambulatory Visit: Payer: Medicare Other

## 2011-06-29 DIAGNOSIS — N390 Urinary tract infection, site not specified: Secondary | ICD-10-CM

## 2011-06-29 LAB — POCT URINALYSIS DIPSTICK
Glucose, UA: NEGATIVE
Ketones, UA: NEGATIVE
Protein, UA: NEGATIVE
Spec Grav, UA: 1.01

## 2011-07-02 ENCOUNTER — Other Ambulatory Visit: Payer: Self-pay | Admitting: Family Medicine

## 2011-07-02 NOTE — Telephone Encounter (Addendum)
Message copied by Dorthula Perfect on Mon Jul 02, 2011  9:57 AM ------      Message from: Aleen Campi, DAVID S      Created: Mon Jul 02, 2011  7:59 AM       There is some bacteria in urine, but no other indicators of infection.   I am not going to put her on any antibiotics at this time.   Pt notified. CM, LPN

## 2011-07-09 IMAGING — NM NM BONE 3 PHASE
1 series · 6 of 6 positions shown · non-contrast
Comparison: MRI dated 08/22/2010

CLINICAL DATA: Left hip pain.  Evaluate for arthroplasty loosening.

NUCLEAR MEDICINE THREE PHASE BONE SCAN
TECHNIQUE: Radionuclide angiographic images, immediate static
blood pool images, and 3-hour delayed static images were obtained
after intravenous injection of radiopharmaceutical.
Radiopharmaceutical: 24 mCi of technetium 99m MDP

[Series 1: fl flow and static · 4.75mm/px · 6 of 39 frames shown]
[frame 4/39  full-range]
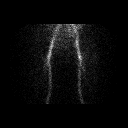
[frame 10/39  full-range]
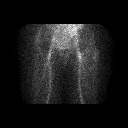
[frame 17/39  full-range]
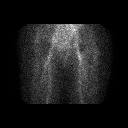
[frame 23/39  full-range]
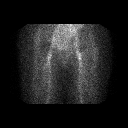
[frame 30/39  full-range]
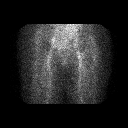
[frame 36/39  full-range]
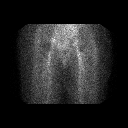

[6 of 6 positions shown; findings below may reference images not displayed]

FINDINGS: On the flow phase portion of the examination there is
symmetric radiotracer activity to both hips.

On the blood pool phase portion of the examination there is
symmetric soft tissue activity surrounding both arthroplasty
devices.

On the delayed phase of the examination there is mild increased
radiotracer uptake along the lateral cortex of the proximal left
femur and in the region of the greater trochanter of the left
femur.
IMPRESSION: There is mild asymmetric increased uptake within the greater
trochanter and proximal lateral cortex of the left hip. Normal flow
and blood pool phase imaging.  The appearance is nonspecific but
may represent normal bone remodeling after arthroplasty.

## 2011-08-27 ENCOUNTER — Other Ambulatory Visit: Payer: Self-pay | Admitting: Internal Medicine

## 2011-08-27 ENCOUNTER — Ambulatory Visit
Admission: RE | Admit: 2011-08-27 | Discharge: 2011-08-27 | Disposition: A | Discharge status: Home / Self Care | Payer: Medicare Other | Source: Ambulatory Visit | Attending: Internal Medicine | Admitting: Internal Medicine

## 2011-08-27 DIAGNOSIS — R609 Edema, unspecified: Secondary | ICD-10-CM

## 2011-09-28 LAB — PROTIME-INR
INR: 2.3 — ABNORMAL HIGH
Prothrombin Time: 25.6 — ABNORMAL HIGH

## 2011-09-28 LAB — CBC
HCT: 25.5 — ABNORMAL LOW
MCV: 85.2
RBC: 2.99 — ABNORMAL LOW
WBC: 12.6 — ABNORMAL HIGH

## 2011-10-01 LAB — CBC
HCT: 44.2
Hemoglobin: 15
Hemoglobin: 9.2 — ABNORMAL LOW
Platelets: 186
Platelets: 232
RBC: 3.17 — ABNORMAL LOW
RBC: 5.19 — ABNORMAL HIGH
RDW: 14
WBC: 4.6
WBC: 7.9

## 2011-10-01 LAB — PROTIME-INR
INR: 1.5
Prothrombin Time: 13
Prothrombin Time: 15.8 — ABNORMAL HIGH

## 2011-10-01 LAB — BASIC METABOLIC PANEL
BUN: 15
BUN: 9
Calcium: 8.3 — ABNORMAL LOW
Calcium: 8.7
Chloride: 106
Creatinine, Ser: 0.82
GFR calc Af Amer: >60
GFR calc Af Amer: >60
GFR calc non Af Amer: >60
GFR calc non Af Amer: >60
GFR calc non Af Amer: >60
Glucose, Bld: 121 — ABNORMAL HIGH
Potassium: 3.8
Sodium: 139
Sodium: 142

## 2011-10-01 LAB — URINE CULTURE

## 2011-10-01 LAB — URINALYSIS, ROUTINE W REFLEX MICROSCOPIC
Glucose, UA: NEGATIVE
Protein, ur: NEGATIVE
Specific Gravity, Urine: 1.012
Urobilinogen, UA: 0.2

## 2011-10-01 LAB — DIFFERENTIAL
Eosinophils Relative: 3
Lymphocytes Relative: 43
Lymphs Abs: 2
Monocytes Relative: 11

## 2011-10-01 LAB — URINE MICROSCOPIC-ADD ON

## 2011-10-01 LAB — TYPE AND SCREEN: ABO/RH(D): B POS

## 2011-10-01 LAB — ABO/RH: ABO/RH(D): B POS

## 2012-02-06 ENCOUNTER — Other Ambulatory Visit: Payer: Self-pay | Admitting: Family Medicine

## 2012-03-05 ENCOUNTER — Other Ambulatory Visit: Payer: Self-pay | Admitting: Family Medicine

## 2012-03-14 ENCOUNTER — Other Ambulatory Visit: Payer: Self-pay | Admitting: Orthopedic Surgery

## 2012-03-14 DIAGNOSIS — R531 Weakness: Secondary | ICD-10-CM

## 2012-03-14 DIAGNOSIS — M545 Low back pain: Secondary | ICD-10-CM

## 2012-03-18 ENCOUNTER — Ambulatory Visit
Admission: RE | Admit: 2012-03-18 | Discharge: 2012-03-18 | Disposition: A | Discharge status: Home / Self Care | Payer: Medicare Other | Source: Ambulatory Visit | Attending: Orthopedic Surgery | Admitting: Orthopedic Surgery

## 2012-03-18 DIAGNOSIS — M545 Low back pain: Secondary | ICD-10-CM

## 2012-03-18 DIAGNOSIS — R531 Weakness: Secondary | ICD-10-CM

## 2012-05-08 ENCOUNTER — Encounter: Payer: Self-pay | Admitting: Medical

## 2012-05-08 ENCOUNTER — Telehealth: Payer: Self-pay | Admitting: Family Medicine

## 2012-05-08 ENCOUNTER — Ambulatory Visit (INDEPENDENT_AMBULATORY_CARE_PROVIDER_SITE_OTHER): Payer: Medicare Other | Admitting: Medical

## 2012-05-08 VITALS — BP 120/80 | HR 60 | Temp 97.9°F | Resp 16 | Wt 161.0 lb

## 2012-05-08 DIAGNOSIS — E785 Hyperlipidemia, unspecified: Secondary | ICD-10-CM | POA: Insufficient documentation

## 2012-05-08 DIAGNOSIS — I10 Essential (primary) hypertension: Secondary | ICD-10-CM

## 2012-05-08 DIAGNOSIS — Z23 Encounter for immunization: Secondary | ICD-10-CM

## 2012-05-08 MED ORDER — SIMVASTATIN 40 MG PO TABS: 40.0000 mg | ORAL_TABLET | Freq: Every day | ORAL | Status: DC

## 2012-05-08 MED ORDER — ATENOLOL 50 MG PO TABS: 50.0000 mg | ORAL_TABLET | Freq: Every day | ORAL | Status: DC

## 2012-05-08 MED ORDER — LISINOPRIL-HYDROCHLOROTHIAZIDE 10-12.5 MG PO TABS: 1.0000 | ORAL_TABLET | Freq: Every day | ORAL | Status: DC

## 2012-05-08 NOTE — Patient Instructions (Signed)
Continue your current medications, make sure you are taking Calcium 1500mg  daily along with Vitamin D 1000 IU daily OTC.   Continue exercise daily, healthy diet.  We will call back with lab information.  See [facility] about the shingles vaccine.

## 2012-05-08 NOTE — Telephone Encounter (Signed)
Message copied by Janeice Robinson on Thu May 08, 2012  2:18 PM ------      Message from: [place], [place] S      Created: Thu May 08, 2012  1:56 PM       pls cal pt and advise that cardiology had done the whole of labs that I routinely do.   The labs all looked fine, so we will just discard the blood from today.  Glad to see she is doing well.

## 2012-05-08 NOTE — Progress Notes (Signed)
Subjective: Here for general recheck.  Been doing well.  Went to orthopedic last week with Dr. August Saucer, rechecked hip and had xray. Everything ok for now.  Went to cardiology in March 2013, had EKG and had labs, everything was "ok."  Goes to cardiology every 3 months.  Exercises - goes to [facility] 3 days per week.  Walks, rides exercise bike.  Walks at the mall too, so active daily.  Eating healthy.  No other new c/o.   Preventative care: Mammogram in 12/2011 Pneumococcal vaccine - once prior? But at least more than 5 years ago. Gets flu shot yearly.  Shingles vaccine - never.    The following portions of the patient's history were reviewed and updated as appropriate: allergies, current medications, past family history, past medical history, past social history, past surgical history and problem list.  Past Medical History  Diagnosis Date  . Anxiety   . Allergy   . Dyslipidemia   . GERD (gastroesophageal reflux disease)   . Hypertension   . Lactose intolerance   . Menopausal problem   . History of uterine cancer 12/2002    s/p hysterectomy  . Cataract   . Palpitations     followed by Dr. Alanda Amass, [facility]  . Lown Maryla Morrow syndrome     No Known Allergies   Review of Systems ROS reviewed and was negative other than noted in HPI or above.    Objective:   Physical Exam  General appearance: alert, no distress, WD/WN HEENT: normocephalic, sclerae anicteric, TMs pearly, nares patent, no discharge or erythema, pharynx normal Oral cavity: MMM, no lesions Neck: supple, no lymphadenopathy, no thyromegaly, no masses Heart: RRR, normal S1, S2, no murmurs Lungs: CTA bilaterally, no wheezes, rhonchi, or rales Abdomen: +bs, soft, non tender, non distended, no masses, no hepatomegaly, no splenomegaly Pulses: 2+ symmetric, upper and lower extremities, normal cap refill Breast: nontender, no masses or lumps, no skin changes, no nipple discharge or inversion, no axillary lymphadenopathy Gyn:  Normal external genitalia without lesions, no abnormal vaginal discharge, s/p hysterectomy, nontender, no masses.  Exam chaperoned by nurse. Rectal: anus normal appearing, occult negative stool, no obvious hemorrhoids    Assessment and Plan :     Encounter Diagnoses  Name Primary?  . Essential hypertension, benign Yes  . Hyperlipidemia   . Need for shingles vaccine   . Need for pneumococcal vaccination    HTN - controlled, c/t same medications.  reviewed labs from cardiology from 01/2012 that were unremarkable for CMET, CBC, Lipid, TSH  Hyperlipidemia - at goal, c/t same medications.  Script today for shingles vaccine that she can get at local pharmacy.  Pneumococcal vaccine updated, VIS and vaccine counseling given.

## 2012-05-08 NOTE — Telephone Encounter (Signed)
PATIENT WAS NOTIFIED THAT WE WILL DISCARD OF HER BLOOD TODAY BECAUSE LAB RESULTS FROM CARDIOLOGY OFFICE WAS THE LAB WORK AND HER RESULTS WAS GOOD. CLS

## 2012-05-09 ENCOUNTER — Encounter: Payer: Self-pay | Admitting: Medical

## 2012-05-09 ENCOUNTER — Encounter: Payer: Self-pay | Admitting: Cardiovascular Disease

## 2012-09-15 ENCOUNTER — Encounter: Payer: Self-pay | Admitting: Medical

## 2012-09-15 ENCOUNTER — Ambulatory Visit: Payer: Medicare Other | Admitting: Medical

## 2012-09-15 ENCOUNTER — Ambulatory Visit (INDEPENDENT_AMBULATORY_CARE_PROVIDER_SITE_OTHER): Payer: Medicare Other | Admitting: Medical

## 2012-09-15 VITALS — BP 120/70 | HR 60 | Temp 97.9°F | Resp 16 | Wt 163.0 lb

## 2012-09-15 DIAGNOSIS — I10 Essential (primary) hypertension: Secondary | ICD-10-CM

## 2012-09-15 DIAGNOSIS — J329 Chronic sinusitis, unspecified: Secondary | ICD-10-CM

## 2012-09-15 DIAGNOSIS — Z23 Encounter for immunization: Secondary | ICD-10-CM

## 2012-09-15 DIAGNOSIS — J309 Allergic rhinitis, unspecified: Secondary | ICD-10-CM

## 2012-09-15 MED ORDER — AMOXICILLIN 875 MG PO TABS: 875.0000 mg | ORAL_TABLET | Freq: Two times a day (BID) | ORAL | Status: DC

## 2012-09-15 MED ORDER — INFLUENZA VIRUS VACC SPLIT PF IM SUSP: 0.5000 mL | Freq: Once | INTRAMUSCULAR | Status: DC

## 2012-09-15 NOTE — Progress Notes (Signed)
Subjective:  [name] is a 78 y.o. female who presents for evaluation of sinus pain.   Been having nagging sinus headache for 2-3 wk, hacking cough, nasal congestion, post nasal drainage, sinus pressure.  Denies fever, nausea, vomiting, no sick contacts.  Using some OTC Allegra with some relief of congestion and runny nose.  No other aggravating or relieving factors.  No other c/o.  Past Medical History  Diagnosis Date  . Anxiety   . Allergy   . Dyslipidemia   . GERD (gastroesophageal reflux disease)   . Hypertension   . Lactose intolerance   . Menopausal problem   . History of uterine cancer 12/2002    s/p hysterectomy  . Cataract   . Palpitations     followed by Dr. Alanda Amass, [facility]  . Lown Maryla Morrow syndrome    ROS as noted in HPI   Objective:   Filed Vitals:   09/15/12 0901  BP: 120/70  Pulse: 60  Temp: 97.9 F (36.6 C)  Resp: 16    General appearance: Alert, WD/WN, no distress                             Skin: warm, no rash                           Head: + mild frontal sinus tenderness                            Eyes: conjunctiva normal, corneas clear, PERRLA                            Ears: pearly TMs, external ear canals normal                          Nose: septum midline, turbinates swollen, with erythema and clear discharge             Mouth/throat: MMM, tongue normal, mild pharyngeal erythema                           Neck: supple, no adenopathy, no thyromegaly, nontender                          Heart: RRR, normal S1, S2, no murmurs                         Lungs: CTA bilaterally, no wheezes, rales, or rhonchi      Assessment and Plan:   Encounter Diagnoses  Name Primary?  . Sinusitis Yes  . Allergic rhinitis   . Need for prophylactic vaccination and inoculation against influenza   . Essential hypertension, benign    Sinusitis - Prescription given for Amoxicillin.  Tylenol or Ibuprofen OTC for fever and malaise.  Discussed symptomatic  relief, nasal saline, and call or return if worse or not improving in 2-3 days.     Allergic rhinitis - c/t Allegra for the next 1-2 mo  Flu vaccine, counseling and VIS given  HTN - currently managed by Dr. Alanda Amass at [facility].  Requested labs from cardiology today.   Reviewed cardiology note from 07/2012.

## 2012-09-16 ENCOUNTER — Encounter: Payer: Self-pay | Admitting: Medical

## 2012-12-24 ENCOUNTER — Encounter: Payer: Self-pay | Admitting: Internal Medicine

## 2012-12-24 LAB — HM MAMMOGRAPHY

## 2012-12-31 ENCOUNTER — Other Ambulatory Visit: Payer: Self-pay | Admitting: Orthopedic Surgery

## 2012-12-31 DIAGNOSIS — R531 Weakness: Secondary | ICD-10-CM

## 2012-12-31 DIAGNOSIS — M542 Cervicalgia: Secondary | ICD-10-CM

## 2013-01-04 ENCOUNTER — Ambulatory Visit
Admission: RE | Admit: 2013-01-04 | Discharge: 2013-01-04 | Disposition: A | Discharge status: Home / Self Care | Payer: Medicare PPO | Source: Ambulatory Visit | Attending: Orthopedic Surgery | Admitting: Orthopedic Surgery

## 2013-01-04 DIAGNOSIS — M542 Cervicalgia: Secondary | ICD-10-CM

## 2013-01-04 DIAGNOSIS — R531 Weakness: Secondary | ICD-10-CM

## 2013-01-30 ENCOUNTER — Other Ambulatory Visit: Payer: Self-pay

## 2013-01-30 MED ORDER — SIMVASTATIN 40 MG PO TABS: 40.0000 mg | ORAL_TABLET | Freq: Every day | ORAL | Status: DC

## 2013-01-30 MED ORDER — LISINOPRIL-HYDROCHLOROTHIAZIDE 10-12.5 MG PO TABS: 1.0000 | ORAL_TABLET | Freq: Every day | ORAL | Status: DC

## 2013-01-30 MED ORDER — ATENOLOL 50 MG PO TABS: 50.0000 mg | ORAL_TABLET | Freq: Every day | ORAL | Status: DC

## 2013-01-30 NOTE — Telephone Encounter (Signed)
SENT MEDS IN  

## 2013-02-14 HISTORY — PX: TRANSTHORACIC ECHOCARDIOGRAM: SHX275

## 2013-02-25 ENCOUNTER — Other Ambulatory Visit (HOSPITAL_COMMUNITY): Payer: Self-pay | Admitting: Cardiovascular Disease

## 2013-02-25 DIAGNOSIS — I471 Supraventricular tachycardia: Secondary | ICD-10-CM

## 2013-02-25 DIAGNOSIS — R9431 Abnormal electrocardiogram [ECG] [EKG]: Secondary | ICD-10-CM

## 2013-03-05 ENCOUNTER — Ambulatory Visit (HOSPITAL_COMMUNITY)
Admission: RE | Admit: 2013-03-05 | Discharge: 2013-03-05 | Disposition: A | Discharge status: Home / Self Care | Payer: Medicare PPO | Source: Ambulatory Visit | Attending: Cardiovascular Disease | Admitting: Cardiovascular Disease

## 2013-03-05 DIAGNOSIS — I498 Other specified cardiac arrhythmias: Secondary | ICD-10-CM | POA: Insufficient documentation

## 2013-03-05 DIAGNOSIS — R9431 Abnormal electrocardiogram [ECG] [EKG]: Secondary | ICD-10-CM | POA: Insufficient documentation

## 2013-03-05 DIAGNOSIS — I079 Rheumatic tricuspid valve disease, unspecified: Secondary | ICD-10-CM | POA: Insufficient documentation

## 2013-03-05 DIAGNOSIS — I059 Rheumatic mitral valve disease, unspecified: Secondary | ICD-10-CM | POA: Insufficient documentation

## 2013-03-05 DIAGNOSIS — I10 Essential (primary) hypertension: Secondary | ICD-10-CM | POA: Insufficient documentation

## 2013-03-05 DIAGNOSIS — I471 Supraventricular tachycardia: Secondary | ICD-10-CM

## 2013-03-05 NOTE — Progress Notes (Signed)
2D Echo Performed 03/05/2013    Rubens Cranston, RCS  

## 2013-05-20 ENCOUNTER — Encounter: Payer: Self-pay | Admitting: Internal Medicine

## 2013-06-03 ENCOUNTER — Encounter: Payer: Self-pay | Admitting: Medical

## 2013-06-03 ENCOUNTER — Ambulatory Visit (INDEPENDENT_AMBULATORY_CARE_PROVIDER_SITE_OTHER): Payer: Medicare PPO | Admitting: Medical

## 2013-06-03 VITALS — BP 110/80 | HR 60 | Temp 97.9°F | Resp 16 | Ht 62.0 in | Wt 166.0 lb

## 2013-06-03 DIAGNOSIS — R252 Cramp and spasm: Secondary | ICD-10-CM

## 2013-06-03 DIAGNOSIS — I10 Essential (primary) hypertension: Secondary | ICD-10-CM

## 2013-06-03 DIAGNOSIS — E785 Hyperlipidemia, unspecified: Secondary | ICD-10-CM

## 2013-06-03 DIAGNOSIS — Z0000 Encounter for general adult medical examination without abnormal findings: Secondary | ICD-10-CM

## 2013-06-03 DIAGNOSIS — Z23 Encounter for immunization: Secondary | ICD-10-CM

## 2013-06-03 LAB — CBC
Hemoglobin: 13.2 g/dL (ref 12.0–15.0)
Platelets: 190 10*3/uL (ref 150–400)
RBC: 4.64 MIL/uL (ref 3.87–5.11)

## 2013-06-03 LAB — COMPREHENSIVE METABOLIC PANEL
ALT: 15 U/L (ref 0–35)
AST: 25 U/L (ref 0–37)
Albumin: 3.9 g/dL (ref 3.5–5.2)
Alkaline Phosphatase: 53 U/L (ref 39–117)
BUN: 16 mg/dL (ref 6–23)
CO2: 27 mEq/L (ref 19–32)
Chloride: 106 mEq/L (ref 96–112)
Creat: 1.03 mg/dL (ref 0.50–1.10)
Glucose, Bld: 92 mg/dL (ref 70–99)
Total Bilirubin: 0.9 mg/dL (ref 0.3–1.2)
Total Protein: 7.6 g/dL (ref 6.0–8.3)

## 2013-06-03 LAB — POCT URINALYSIS DIPSTICK
Bilirubin, UA: NEGATIVE
Blood, UA: NEGATIVE
Ketones, UA: NEGATIVE
Protein, UA: NEGATIVE
pH, UA: 7

## 2013-06-03 LAB — MAGNESIUM: Magnesium: 2 mg/dL (ref 1.5–2.5)

## 2013-06-03 LAB — LIPID PANEL
LDL Cholesterol: 73 mg/dL (ref 0–99)
VLDL: 26 mg/dL (ref 0–40)

## 2013-06-03 LAB — CK: Total CK: 100 U/L (ref 7–177)

## 2013-06-03 NOTE — Progress Notes (Addendum)
Subjective:   HPI  [name] is a 79 y.o. female who presents for a complete physical.  Doing well, normal state of health without c/o.    Preventative care: Last ophthalmology visit:Yes- March 2014 with Dr. Nile Riggs Last dental visit:yes - Dr. Elwyn Reach Last colonoscopy:09/2009 Last mammogram:12/2012 Last gynecological exam:2012 Last EKG:march 2014 with cardiology- Dr. Alanda Amass Last labs:01/2012  Prior vaccinations: TD or Tdap:2010 at her last hip surgery Influenza:2013 Pneumococcal:04/2012 Shingles/Zostavax:n/a  Advanced directive:yes Health care power of attorney:yes Living will:yes  Concerns: Gets cramps in legs at night, going on a few weeks. She walks at least 4 days per week.  She does drink water more than other beverages, drinks some herbal tea at night.  Denies restless leg symptoms.  Sleeps ok in general.   She notes hx/o bone density scan several years ago.  Taking 500mg  calcium daily, eats yogurt BID, and takes 1000 IU Vit D daily.  No falls, no height loss, no ETOH use, is exercising regularly.  Reviewed their medical, surgical, family, social, medication, and allergy history and updated chart as appropriate.   Past Medical History  Diagnosis Date  . Anxiety   . Allergy   . Dyslipidemia   . Hypertension   . Lactose intolerance   . Menopausal problem   . 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 Alanda Amass  . GERD (gastroesophageal reflux disease)     hx/o, resolved  . Cataract     hx/o surgery, both eyes; Dr. Nile Riggs  . Diverticulosis     per 2010 colonoscopy  . History of cardiovascular stress test 06/22/10    normal myocadial perfusion study, 72% EF; Dr. Allyson Sabal  . H/O echocardiogram 06/22/10    mild aortic valve stenosis, left ventricular normal, EF 55%; Dr. Allyson Sabal  . History of mammogram     benign bilat calcifications, heterogeneously dense breasts, stable mammograms 2009-2014  . H/O bone density  study 05/10/2006    normal bone density  . 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 De Blanch gyencology oncology [place]; Dr. Sylvester Harder gynecology  . SVT (supraventricular tachycardia)     asymptomatic 2014, hx/o SVT, Dr. Alanda Amass  . History of pneumococcal vaccination     Past Surgical History  Procedure Laterality Date  . Colonoscopy  10/06/09    scattered diverticla, medium hemorrhoids; Dr. Elnoria Howard  . Total hip arthroplasty  2008 2010     twice left (complication on the left, once on the right; Dr. Dorene Grebe  . Cataract extraction      bilat  . Abdominal hysterectomy      hx/o uterine cancer    History reviewed. No pertinent family history.  History   Social History  . Marital Status: Married    Spouse Name: N/A    Number of Children: N/A  . Years of Education: N/A   Occupational History  . Not on file.   Social History Main Topics  . Smoking status: Never Smoker   . Smokeless tobacco: Never Used  . Alcohol Use: No  . Drug Use: No  . Sexually Active: No     Comment: walks 3x/wk, retired, married, 2 grandchildren   Other Topics Concern  . Not on file   Social History Narrative   Married, 1 son, 2  grandchildren, ages 95yo and 84yo; exercise 4 days per week - walking, mall and [employer].  Husband has hx/o CVA, but is ambulatory.    Current Outpatient Prescriptions on File Prior to Visit  Medication Sig Dispense Refill  . aspirin 81 MG tablet Take 81 mg by mouth daily.        Marland Kitchen atenolol (TENORMIN) 50 MG tablet Take 1 tablet (50 mg total) by mouth daily.  90 tablet  0  . calcium-vitamin D (OSCAL WITH D) 500-200 MG-UNIT per tablet Take 1 tablet by mouth 2 (two) times daily.      Marland Kitchen lisinopril-hydrochlorothiazide (PRINZIDE,ZESTORETIC) 10-12.5 MG per tablet Take 1 tablet by mouth daily.  90 tablet  0   . Multiple Vitamin (MULTIVITAMIN) tablet Take 1 tablet by mouth daily.      . simvastatin (ZOCOR) 40 MG tablet Take 1 tablet (40 mg total) by mouth at bedtime.  90 tablet  0   No current facility-administered medications on file prior to visit.    No Known Allergies  Immunization History  Administered Date(s) Administered  . Influenza Split 09/15/2012  . Pneumococcal Polysaccharide 05/08/2012  . Tdap 05/17/2009     Review of Systems Constitutional: -fever, -chills, -sweats, -unexpected weight change, -decreased appetite, -fatigue Allergy: -sneezing, -itching, -congestion Dermatology: -changing moles, --rash, -lumps ENT: -runny nose, -ear pain, -sore throat, -hoarseness, -sinus pain, -teeth pain, - ringing in ears, -hearing loss, -nosebleeds Cardiology: -chest pain, -palpitations, -swelling, -difficulty breathing when lying flat, -waking up short of breath Respiratory: -cough, -shortness of breath, -difficulty breathing with exercise or exertion, -wheezing, -coughing up blood Gastroenterology: -abdominal pain, -nausea, -vomiting, -diarrhea, -constipation, -blood in stool, -changes in bowel movement, -difficulty swallowing or eating Hematology: -bleeding, -bruising  Musculoskeletal: -joint aches, -muscle aches, -joint swelling, -back pain, -neck pain, +cramping, -changes in gait Ophthalmology: denies vision changes, eye redness, itching, discharge Urology: -burning with urination, -difficulty urinating, -blood in urine, -urinary frequency, -urgency, -incontinence Neurology: -headache, -weakness, -tingling, -numbness, -memory loss, -falls, -dizziness Psychology: -depressed mood, -agitation, -sleep problems     Objective:   Physical Exam  nurse notes and vitals reviewed  General appearance: alert, no distress, WD/WN,  Skin: left dorsal forearm scar from prior laceration, no worrisome lesions HEENT: normocephalic, conjunctiva/corneas normal, sclerae anicteric, PERRLA, EOMi, nares  patent, no discharge or erythema, pharynx normal Oral cavity: MMM, tongue normal, teeth - dentures upper and lower Neck: supple, no lymphadenopathy, no thyromegaly, no masses, normal ROM, no bruits Chest: non tender, normal shape and expansion Heart: RRR, normal S1, S2, no murmurs Lungs: CTA bilaterally, no wheezes, rhonchi, or rales Abdomen: +bs, soft, non tender, non distended, no masses, no hepatomegaly, no splenomegaly, no bruits Back: non tender, normal ROM, no scoliosis Musculoskeletal: upper extremities non tender, no obvious deformity, normal ROM throughout, lower extremities non tender, no obvious deformity, normal ROM throughout Extremities: no edema, no cyanosis, no clubbing Pulses: 2+ symmetric, upper and lower extremities, normal cap refill Neurological: alert, oriented x 3, CN2-12 intact, strength normal upper extremities and lower extremities, sensation normal throughout, DTRs 2+ throughout, no cerebellar signs, gait normal Psychiatric: normal affect, behavior normal, pleasant  Breast/gyn/rectal - not examined   Assessment and Plan :    Encounter Diagnoses  Name Primary?  . Routine general medical examination at a health care facility Yes  . Hyperlipidemia   . Essential hypertension, benign   . Leg cramps   . Need for Zostavax administration    Physical exam - reviewed chart records, updated chart accordingly today.  Discussed  healthy lifestyle, diet, exercise, preventative care, vaccinations, and addressed their concerns.  Handout given.  Labs today routine, c/t same medications.   advised increase water intake in general, can use compression hose OTC summer just as she does in winter.  Given her age and prior preventative screenings, advised that she can consider stopping future screening mammograms, pap smears, and colonoscopies.  We will have to weight risks/benefits.  I suspect a longer than average life expectancy, so she could potentiall benefit from future mammogram,  colonoscopy.  Discussed repeat bone density scan between now and 3 years from now.  She is taking appropriate vit D and calcium, exercising regularly, not using alcohol, and is low risk for osteoporosis.  She will check on coverage/costs for zostavax and let me know about this.  Follow-up pending labs.

## 2013-06-11 ENCOUNTER — Telehealth: Payer: Self-pay | Admitting: Medical

## 2013-06-11 MED ORDER — LISINOPRIL-HYDROCHLOROTHIAZIDE 10-12.5 MG PO TABS: 1.0000 | ORAL_TABLET | Freq: Every day | ORAL | Status: DC

## 2013-06-11 MED ORDER — ATENOLOL 50 MG PO TABS: 50.0000 mg | ORAL_TABLET | Freq: Every day | ORAL | Status: DC

## 2013-06-11 MED ORDER — SIMVASTATIN 40 MG PO TABS: 40.0000 mg | ORAL_TABLET | Freq: Every day | ORAL | Status: DC

## 2013-06-11 NOTE — Telephone Encounter (Signed)
SENT MEDS IN  

## 2013-07-10 ENCOUNTER — Encounter: Payer: Self-pay | Admitting: Medical

## 2013-07-10 ENCOUNTER — Ambulatory Visit (INDEPENDENT_AMBULATORY_CARE_PROVIDER_SITE_OTHER): Payer: Medicare PPO | Admitting: Medical

## 2013-07-10 VITALS — BP 112/80 | HR 60 | Temp 98.0°F | Resp 14 | Wt 163.0 lb

## 2013-07-10 DIAGNOSIS — J329 Chronic sinusitis, unspecified: Secondary | ICD-10-CM

## 2013-07-10 MED ORDER — AMOXICILLIN 875 MG PO TABS: 875.0000 mg | ORAL_TABLET | Freq: Two times a day (BID) | ORAL | Status: DC

## 2013-07-10 NOTE — Progress Notes (Signed)
Subjective: Here for possible sinus infection.  She notes feeling sinus pressure over a week now.  She notes pressure across her face, post nasal drainage, gets pain in sinuses if she leans forward.  She reports some nausea.  Denies fever, no vomiting, no teeth pain.   Been using some Allegra with slight relief.  Using saline in nose and salt water gargles.  She is a nonsmoker.   No sick contacts.    Past Medical History  Diagnosis Date  . Anxiety   . Allergy   . Dyslipidemia   . Hypertension   . Lactose intolerance   . Menopausal problem   . 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 Alanda Amass  . GERD (gastroesophageal reflux disease)     hx/o, resolved  . Cataract     hx/o surgery, both eyes; Dr. Nile Riggs  . Diverticulosis     per 2010 colonoscopy  . History of cardiovascular stress test 06/22/10    normal myocadial perfusion study, 72% EF; Dr. Allyson Sabal  . H/O echocardiogram 06/22/10    mild aortic valve stenosis, left ventricular normal, EF 55%; Dr. Allyson Sabal  . History of mammogram     benign bilat calcifications, heterogeneously dense breasts, stable mammograms 2009-2014  . H/O bone density study 05/10/2006    normal bone density  . 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 De Blanch gyencology oncology [place]; Dr. Sylvester Harder gynecology  . SVT (supraventricular tachycardia)     asymptomatic 2014, hx/o SVT, Dr. Alanda Amass  . History of pneumococcal vaccination   . Full dentures    ROS as in subjective  Objective: Filed Vitals:   07/10/13 0846  BP: 112/80  Pulse: 60  Temp: 98 F (36.7 C)  Resp: 14    General appearance: alert, no distress, WD/WN HEENT: normocephalic, tender maxillary and frontal sinus, sclerae anicteric, TMs pearly, nares with swollen  turbinates, some muculopurulent discharge, mild erythema, pharynx normal Oral cavity: MMM, no lesions Neck: supple, no lymphadenopathy, no thyromegaly, no masses Lungs: CTA bilaterally, no wheezes, rhonchi, or rales  Assessment: Encounter Diagnosis  Name Primary?  . Sinusitis Yes     Plan: Begin amoxicillin, c/t nasal saline and salt water gargles, hydration, call/return if worse or not improving.

## 2013-07-10 NOTE — Patient Instructions (Signed)
Continue nasal saline and salt water gargles.  Consider plain Mucinex OTC.   Don't use Mucinex D.  You can use Mucienx DM if cough as well.  Make sure you are drinking plenty of water.  Begin Amoxicillin antibiotic and complete the course.   Call/return if not improving.

## 2013-09-08 ENCOUNTER — Telehealth: Payer: Self-pay | Admitting: Family Medicine

## 2013-09-08 NOTE — Telephone Encounter (Signed)
Message copied by Mads Borgmeyer L on Tue Sep 08, 2013  9:01 AM ------      Message from: Jac Canavan      Created: Mon Sep 07, 2013  7:58 AM       I received a report from their insurer's care management service informing me of a health care recommendation.  They show a BP in left arm much higher than the right.  Have her come in for nurse visit BP check both arms as well as pulse check in arms and legs.  Let me see what you get, and then I can decide if we need to do anything further.        ------

## 2013-09-08 NOTE — Telephone Encounter (Signed)
I called and I spoke with the patient about your message and she said that she saw Dr. Alanda Amass on 09/04/13 and they check her BP in her left arm and it was 120/ 70 at the visit. CLS

## 2013-09-15 ENCOUNTER — Encounter: Payer: Self-pay | Admitting: Cardiovascular Disease

## 2013-10-05 ENCOUNTER — Encounter: Payer: Self-pay | Admitting: Medical

## 2013-10-05 ENCOUNTER — Ambulatory Visit (INDEPENDENT_AMBULATORY_CARE_PROVIDER_SITE_OTHER): Payer: Medicare PPO | Admitting: Medical

## 2013-10-05 VITALS — BP 102/60 | HR 68 | Temp 97.5°F | Resp 16 | Wt 166.0 lb

## 2013-10-05 DIAGNOSIS — I10 Essential (primary) hypertension: Secondary | ICD-10-CM

## 2013-10-05 DIAGNOSIS — Z23 Encounter for immunization: Secondary | ICD-10-CM

## 2013-10-05 NOTE — Progress Notes (Signed)
Subjective: Here to discuss home health visit.  Recently August 2014 [facility] nurse came to her house to do assessment.  BPs was apparently very high in 1 arm (left), normal in the right.  Since then she has been back to cardiology, Dr. Alanda Amass in September, and BPs were normal, cardiologist gave her a good report, no changes were recommended.  She even had EKG at that time.   She wanted to f/u here to make sure Bp was ok.  She also wants her flu shot today. She currently feels fine, no chest pain no palpitations, no complaint otherwise.  Past Medical History  Diagnosis Date  . Anxiety   . Allergy   . Dyslipidemia   . Hypertension   . Lactose intolerance   . Menopausal problem   . 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 Alanda Amass  . GERD (gastroesophageal reflux disease)     hx/o, resolved  . Cataract     hx/o surgery, both eyes; Dr. Nile Riggs  . Diverticulosis     per 2010 colonoscopy  . History of cardiovascular stress test 06/22/10    normal myocadial perfusion study, 72% EF; Dr. Allyson Sabal  . H/O echocardiogram 06/22/10    mild aortic valve stenosis, left ventricular normal, EF 55%; Dr. Allyson Sabal  . History of mammogram     benign bilat calcifications, heterogeneously dense breasts, stable mammograms 2009-2014  . H/O bone density study 05/10/2006    normal bone density  . 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 De Blanch gyencology oncology [place]; Dr. Sylvester Harder gynecology  . SVT (supraventricular tachycardia)     asymptomatic 2014, hx/o SVT, Dr. Alanda Amass  . History of pneumococcal vaccination   . Full dentures    ROS as in subjective  Objective: Filed Vitals:   10/05/13 1335  BP: 102/60  Pulse: 68  Temp: 97.5 F (36.4 C)  Resp: 16     General appearance: alert, no distress, WD/WN  Heart: RRR, normal S1, S2, no murmurs Lungs: CTA bilaterally, no wheezes, rhonchi, or rales Pulses: 2+ symmetric, upper and lower extremities, normal cap refill Ext: no edema   Assessment: Encounter Diagnoses  Name Primary?  . Essential hypertension, benign Yes  . Need for prophylactic vaccination and inoculation against influenza     Plan: Reassured, BPs normal today, reviewed recent labs from 6/14 as well as the 9/14 cardiology notes.  Reviewed the recent home health assessment as well.  I suspect there was just error in reporting the left arm BP.  C/t same medications, healthy diet.  Counseled on the influenza virus vaccine.  Vaccine information sheet given.  Influenza vaccine given after consent obtained.  follow-up prn

## 2013-12-25 LAB — HM MAMMOGRAPHY: HM Mammogram: NEGATIVE

## 2013-12-28 ENCOUNTER — Encounter: Payer: Self-pay | Admitting: Internal Medicine

## 2013-12-29 ENCOUNTER — Encounter: Payer: Self-pay | Admitting: Medical

## 2013-12-29 ENCOUNTER — Ambulatory Visit (INDEPENDENT_AMBULATORY_CARE_PROVIDER_SITE_OTHER): Payer: Medicare PPO | Admitting: Medical

## 2013-12-29 VITALS — BP 110/68 | HR 80 | Temp 97.6°F | Resp 16 | Wt 162.0 lb

## 2013-12-29 DIAGNOSIS — J329 Chronic sinusitis, unspecified: Secondary | ICD-10-CM

## 2013-12-29 MED ORDER — AMOXICILLIN 875 MG PO TABS: 875.0000 mg | ORAL_TABLET | Freq: Two times a day (BID) | ORAL | Status: DC

## 2013-12-29 NOTE — Progress Notes (Signed)
Subjective:  [name] is a 80 y.o. female who presents for "sinus infection."  Symptoms include 2wk hx/o eyes burning, watering, nasal congestion, but a week ago started having teeth pain, sinus pressure, has had some cough, ear pressure.  Denies fever, sore throat, aches, fatigue, SOB.  Past history is significant for hx/o sinus infections during times of weather changes in the year. Patient is a non-smoker.  Using Benadryl, Theraflu and neti pot for symptoms.  Denies sick contacts.  No other aggravating or relieving factors.  No other c/o.  ROS as in subjective   Objective: Filed Vitals:   12/29/13 0956  BP: 110/68  Pulse: 80  Temp: 97.6 F (36.4 C)  Resp: 16    General appearance: Alert, WD/WN, no distress                             Skin: warm, no rash                           Head: +ethmoid sinus tenderness                            Eyes: conjunctiva normal, corneas clear, PERRLA                            Ears: pearly TMs, external ear canals normal                          Nose: septum midline, turbinates swollen, with erythema and clear discharge             Mouth/throat: MMM, tongue normal, mild pharyngeal erythema                           Neck: supple, no adenopathy, no thyromegaly, nontender                          Heart: RRR, normal S1, S2, no murmurs                         Lungs: CTA bilaterally, no wheezes, rales, or rhonchi      Assessment and Plan:   Encounter Diagnosis  Name Primary?  . Sinusitis Yes    Prescription given for Amoxicillin.  Can use OTC Mucinex for congestion.  Tylenol or Ibuprofen OTC for fever and malaise.  Discussed symptomatic relief, nasal saline flush, and call or return if worse or not improving in 2-3 days.

## 2014-02-03 ENCOUNTER — Telehealth: Payer: Self-pay | Admitting: Medical

## 2014-02-03 ENCOUNTER — Other Ambulatory Visit: Payer: Self-pay | Admitting: Family Medicine

## 2014-02-03 MED ORDER — LISINOPRIL-HYDROCHLOROTHIAZIDE 10-12.5 MG PO TABS: 1.0000 | ORAL_TABLET | Freq: Every day | ORAL | Status: DC

## 2014-02-03 MED ORDER — SIMVASTATIN 40 MG PO TABS: 40.0000 mg | ORAL_TABLET | Freq: Every day | ORAL | Status: DC

## 2014-02-03 MED ORDER — ATENOLOL 50 MG PO TABS: 50.0000 mg | ORAL_TABLET | Freq: Every day | ORAL | Status: DC

## 2014-02-03 NOTE — Telephone Encounter (Signed)
Send refills

## 2014-02-03 NOTE — Telephone Encounter (Signed)
Medication refills was sent. CLS

## 2014-02-24 ENCOUNTER — Encounter: Payer: Self-pay | Admitting: Medical

## 2014-02-24 ENCOUNTER — Ambulatory Visit (INDEPENDENT_AMBULATORY_CARE_PROVIDER_SITE_OTHER): Payer: Medicare PPO | Admitting: Medical

## 2014-02-24 VITALS — BP 120/80 | HR 60 | Temp 97.6°F | Resp 12 | Wt 164.0 lb

## 2014-02-24 DIAGNOSIS — J309 Allergic rhinitis, unspecified: Secondary | ICD-10-CM

## 2014-02-24 DIAGNOSIS — H1045 Other chronic allergic conjunctivitis: Secondary | ICD-10-CM

## 2014-02-24 DIAGNOSIS — H1010 Acute atopic conjunctivitis, unspecified eye: Secondary | ICD-10-CM

## 2014-02-24 MED ORDER — MOMETASONE FUROATE 50 MCG/ACT NA SUSP: 2.0000 | Freq: Every day | NASAL | Status: DC

## 2014-02-24 MED ORDER — CETIRIZINE HCL 10 MG PO TABS: 10.0000 mg | ORAL_TABLET | Freq: Every day | ORAL | Status: DC

## 2014-02-24 NOTE — Patient Instructions (Signed)
  Thank you for giving me the opportunity to serve you today.    Your diagnosis today includes: Encounter Diagnoses  Name Primary?  . Allergic rhinitis Yes  . Allergic conjunctivitis      Specific recommendations today include:  Begin Zyrtec/Cetirizine 10mg  daily at bedtime  Begin Nasonex 1 spray per nostril daily in the morning (sniff sniff)  You can consider OTC allergy eye drop if needed  Follow up: 1 month if not improving

## 2014-02-24 NOTE — Progress Notes (Signed)
  Subjective:  [name] is a 80 y.o. female who presents for sinuses.   Symptoms include nasal pressure, "cold" in eyes when she gets up, eyes burn at times, some itching, watery, sneezing, runny nose, ears feels like water in them.   In January saw me for sinuitis and got better with antibiotics, but these symptoms started a few weeks later.  Occasionally uses benadryl.  Occasional cough, no sore throat, no fever.  Does have some post nasal drainage.   No other aggravating or relieving factors.  No animals in the home.  The following portions of the patient's history were reviewed and updated as appropriate: allergies, current medications, past family history, past medical history, past social history, past surgical history and problem list.  ROS as in subjective   Objective: Filed Vitals:   02/24/14 1440  BP: 120/80  Pulse: 60  Temp: 97.6 F (36.4 C)  Resp: 12    General appearance: Alert, WD/WN, no distress                             Skin: warm, no rash                           Head: no sinus tenderness                            Eyes: conjunctiva normal, corneas clear, PERRLA                            Ears: pearly TMs, external ear canals normal                          Nose: septum midline, turbinates swollen, no erythema, no discharge                  Mouth/throat: MMM, tongue normal, no pharyngeal erythema or exudate                           Neck: supple, no adenopathy, no thyromegaly, non tender                          Lungs: CTA bilaterally, no wheezes, rales, or rhonchi   Assessment:   Encounter Diagnoses  Name Primary?  . Allergic rhinitis Yes  . Allergic conjunctivitis       Plan:    discussed symptms and exam suggesting allergen triggers.  Specific recommendations today include:  Begin Zyrtec/Cetirizine 10mg  daily at bedtime  Begin Nasonex 1 spray per nostril daily in the morning (sniff sniff)  You can consider OTC allergy eye drop if  needed  Allergen avoidance discussed.  Follow-up 63mo

## 2014-03-05 ENCOUNTER — Telehealth: Payer: Self-pay | Admitting: Medical

## 2014-03-05 ENCOUNTER — Other Ambulatory Visit: Payer: Self-pay | Admitting: Medical

## 2014-03-05 MED ORDER — IPRATROPIUM BROMIDE 0.03 % NA SOLN: 2.0000 | Freq: Two times a day (BID) | NASAL | Status: DC

## 2014-03-05 MED ORDER — MONTELUKAST SODIUM 10 MG PO TABS: 10.0000 mg | ORAL_TABLET | Freq: Every day | ORAL | Status: DC

## 2014-03-05 NOTE — Telephone Encounter (Signed)
Pt states ins wouldn't pay for anything for her allergies,  Michela Pitcher you told her you would send something else to pharmacy.  She doesn't want anything with steroids in it cause it doesn't agree with her.  Hasn't gotten anything at all yet, just been taking Benadryl

## 2014-03-05 NOTE — Telephone Encounter (Signed)
Left message for pt

## 2014-03-05 NOTE — Telephone Encounter (Signed)
If insurer will not pay for Nasonex, then make sure she has the coupon card to call and activate the coupon, and if that doesn't work, I did send 2 medications to pharmacy, Singulair tablet to use daily at bedtime and ipratropium nasal spray as an alternative

## 2014-03-07 ENCOUNTER — Encounter: Payer: Self-pay | Admitting: *Deleted

## 2014-03-11 ENCOUNTER — Encounter: Payer: Self-pay | Admitting: Internal Medicine

## 2014-03-11 ENCOUNTER — Ambulatory Visit (INDEPENDENT_AMBULATORY_CARE_PROVIDER_SITE_OTHER): Payer: Medicare PPO | Admitting: Internal Medicine

## 2014-03-11 VITALS — BP 100/60 | HR 62 | Ht 62.0 in | Wt 163.7 lb

## 2014-03-11 DIAGNOSIS — E785 Hyperlipidemia, unspecified: Secondary | ICD-10-CM

## 2014-03-11 DIAGNOSIS — I471 Supraventricular tachycardia: Secondary | ICD-10-CM

## 2014-03-11 DIAGNOSIS — I498 Other specified cardiac arrhythmias: Secondary | ICD-10-CM

## 2014-03-11 DIAGNOSIS — I10 Essential (primary) hypertension: Secondary | ICD-10-CM

## 2014-03-11 DIAGNOSIS — I456 Pre-excitation syndrome: Secondary | ICD-10-CM

## 2014-03-11 NOTE — Patient Instructions (Signed)
Follow-up annually

## 2014-03-23 ENCOUNTER — Encounter: Payer: Self-pay | Admitting: Internal Medicine

## 2014-03-23 DIAGNOSIS — I456 Pre-excitation syndrome: Secondary | ICD-10-CM | POA: Insufficient documentation

## 2014-03-23 DIAGNOSIS — I471 Supraventricular tachycardia: Secondary | ICD-10-CM | POA: Insufficient documentation

## 2014-03-23 NOTE — Progress Notes (Signed)
OFFICE NOTE  Chief Complaint:  Establish new cardiologist  Primary Care Physician: Crisoforo Oxford, PA-C  HPI:  [name] is a pleasant 80 year old female previously followed by Dr. Rollene Fare. She's here today to establish cardiac care with me. She has history of hypertension, remote bilateral hip placements and redo hip surgery for failed prosthesis in 2011. 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 She was previously on flecainide but has been taken off of that. She's had no recurrence of her palpitations on beta blockers. She had a negative Myoview for ischemia in 2011 an echocardiogram in 2014 which showed normal systolic function and moderate mitral regurgitation.  She has no complaints today.  PMHx:  Past Medical History  Diagnosis Date  . Anxiety   . Allergy   . Dyslipidemia   . Hypertension   . Lactose intolerance   . Menopausal problem   . 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 Rollene Fare  . GERD (gastroesophageal reflux disease)     hx/o, resolved  . Cataract     hx/o surgery, both eyes; Dr. Gershon Crane  . Diverticulosis     per 2010 colonoscopy  . History of cardiovascular stress test 06/22/2010    normal bruce myocadial perfusion study, 72% EF; Dr. Gwenlyn Found  . H/O echocardiogram 06/22/2010    mild aortic valve stenosis, left ventricular normal, EF 55%; Dr. Gwenlyn Found  . History of mammogram     benign bilat calcifications, heterogeneously dense breasts, stable mammograms 2009-2014  . H/O bone density study 05/10/2006    normal bone density  . 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 Marti Sleigh gyencology oncology [place]; Dr. Lorriane Shire gynecology  . SVT (supraventricular tachycardia)    asymptomatic 2014, hx/o SVT, Dr. Rollene Fare  . History of pneumococcal vaccination   . Full dentures     Past Surgical History  Procedure Laterality Date  . Colonoscopy  10/06/2009    scattered diverticla, medium hemorrhoids; Dr. Benson Norway  . Total hip arthroplasty Bilateral 2008, 2010     twice left (complication on the left, once on the right; Dr. Alphonzo Severance  . Cataract extraction      bilat  . Abdominal hysterectomy Bilateral     h/o uterine cancer; oophrectomy  . Transthoracic echocardiogram  02/2013    ordered for SVT; EF 55-65%; calcified MV annulus, mod MR; RSVP increased; RA mildly dilated; mod TR; PA peak pressure 72mmHg  . Nm myocar perf wall motion      FAMHx:  No family history on file.  SOCHx:   reports that she has never smoked. She has never used smokeless tobacco. She reports that she does not drink alcohol or use illicit drugs.  ALLERGIES:  No Known Allergies  ROS: A comprehensive review of systems was negative.  HOME MEDS: Current Outpatient Prescriptions  Medication Sig Dispense Refill  . aspirin 81 MG tablet Take 81 mg by mouth daily.        Marland Kitchen atenolol (TENORMIN) 50 MG tablet Take 1 tablet (50 mg total) by mouth daily.  90 tablet  1  . cetirizine (ZYRTEC) 10 MG tablet Take 1 tablet (10 mg total) by mouth at bedtime.  30 tablet  11  .  DimenhyDRINATE (MOTION SICKNESS PO) Take by mouth as needed.      Marland Kitchen ipratropium (ATROVENT) 0.03 % nasal spray Place 2 sprays into both nostrils every 12 (twelve) hours.  30 mL  2  . lisinopril-hydrochlorothiazide (PRINZIDE,ZESTORETIC) 10-12.5 MG per tablet Take 1 tablet by mouth daily.  90 tablet  1  . Multiple Vitamin (MULTIVITAMIN) tablet Take 1 tablet by mouth daily.      . simvastatin (ZOCOR) 40 MG tablet Take 1 tablet (40 mg total) by mouth at bedtime.  90 tablet  1   No current facility-administered medications for this visit.    LABS/IMAGING: No results found for this or any previous visit (from the past 48  hour(s)). No results found.  VITALS: BP 100/60  Pulse 62  Ht 5\' 2"  (1.575 m)  Wt 163 lb 11.2 oz (74.254 kg)  BMI 29.93 kg/m2  EXAM: General appearance: alert and no distress Neck: no carotid bruit, no JVD and thyroid not enlarged, symmetric, no tenderness/mass/nodules Lungs: clear to auscultation bilaterally Heart: regular rate and rhythm, S1, S2 normal and systolic murmur: holosystolic 3/6, blowing at apex Abdomen: soft, non-tender; bowel sounds normal; no masses,  no organomegaly Extremities: extremities normal, atraumatic, no cyanosis or edema Pulses: 2+ and symmetric Skin: Skin color, texture, turgor normal. 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 Hypertension 4. Dyslipidemia  PLAN: 1.   [name] has not had significant recurrence of her palpitations with beta blockers. Her blood pressure is well-controlled on her current medication regimen. Her cholesterol has been at goal on simvastatin. I recommend continue current medications and will plan to see her back annually.  Pixie Casino, MD, [facility] Attending Cardiologist [facility]  HILTY,Kenneth C 03/23/2014, 9:43 PM

## 2014-06-08 ENCOUNTER — Ambulatory Visit (INDEPENDENT_AMBULATORY_CARE_PROVIDER_SITE_OTHER): Payer: Medicare PPO | Admitting: Medical

## 2014-06-08 ENCOUNTER — Encounter: Payer: Self-pay | Admitting: Medical

## 2014-06-08 VITALS — BP 98/60 | HR 60 | Temp 97.8°F | Resp 16 | Ht 62.5 in | Wt 163.0 lb

## 2014-06-08 DIAGNOSIS — E785 Hyperlipidemia, unspecified: Secondary | ICD-10-CM

## 2014-06-08 DIAGNOSIS — T753XXA Motion sickness, initial encounter: Secondary | ICD-10-CM

## 2014-06-08 DIAGNOSIS — Z0000 Encounter for general adult medical examination without abnormal findings: Secondary | ICD-10-CM

## 2014-06-08 DIAGNOSIS — I10 Essential (primary) hypertension: Secondary | ICD-10-CM

## 2014-06-08 LAB — CBC WITH DIFFERENTIAL/PLATELET
Basophils Absolute: 0 10*3/uL (ref 0.0–0.1)
Basophils Relative: 0 % (ref 0–1)
EOS ABS: 0.1 10*3/uL (ref 0.0–0.7)
Eosinophils Relative: 3 % (ref 0–5)
HCT: 39.2 % (ref 36.0–46.0)
Hemoglobin: 13.2 g/dL (ref 12.0–15.0)
LYMPHS PCT: 42 % (ref 12–46)
Lymphs Abs: 1.9 10*3/uL (ref 0.7–4.0)
MCH: 29 pg (ref 26.0–34.0)
MCHC: 33.7 g/dL (ref 30.0–36.0)
MCV: 86.2 fL (ref 78.0–100.0)
Monocytes Absolute: 0.6 10*3/uL (ref 0.1–1.0)
Monocytes Relative: 14 % — ABNORMAL HIGH (ref 3–12)
NEUTROS PCT: 41 % — AB (ref 43–77)
Neutro Abs: 1.8 10*3/uL (ref 1.7–7.7)
PLATELETS: 205 10*3/uL (ref 150–400)
RBC: 4.55 MIL/uL (ref 3.87–5.11)
RDW: 14.2 % (ref 11.5–15.5)
WBC: 4.5 10*3/uL (ref 4.0–10.5)

## 2014-06-08 LAB — POCT URINALYSIS DIPSTICK
Bilirubin, UA: NEGATIVE
Blood, UA: NEGATIVE
GLUCOSE UA: NEGATIVE
KETONES UA: NEGATIVE
Leukocytes, UA: NEGATIVE
Nitrite, UA: NEGATIVE
Protein, UA: NEGATIVE
Urobilinogen, UA: NEGATIVE
pH, UA: 6

## 2014-06-08 LAB — COMPREHENSIVE METABOLIC PANEL
ALBUMIN: 4 g/dL (ref 3.5–5.2)
ALT: 21 U/L (ref 0–35)
AST: 28 U/L (ref 0–37)
Alkaline Phosphatase: 51 U/L (ref 39–117)
BUN: 23 mg/dL (ref 6–23)
CALCIUM: 9.3 mg/dL (ref 8.4–10.5)
CO2: 28 meq/L (ref 19–32)
Chloride: 104 mEq/L (ref 96–112)
Creat: 1.15 mg/dL — ABNORMAL HIGH (ref 0.50–1.10)
GLUCOSE: 80 mg/dL (ref 70–99)
POTASSIUM: 4.2 meq/L (ref 3.5–5.3)
Sodium: 140 mEq/L (ref 135–145)
TOTAL PROTEIN: 7.5 g/dL (ref 6.0–8.3)
Total Bilirubin: 0.9 mg/dL (ref 0.2–1.2)

## 2014-06-08 MED ORDER — IPRATROPIUM BROMIDE 0.03 % NA SOLN: 2.0000 | Freq: Two times a day (BID) | NASAL | Status: DC

## 2014-06-08 MED ORDER — ASPIRIN 81 MG PO TABS: 81.0000 mg | ORAL_TABLET | Freq: Every day | ORAL | Status: DC

## 2014-06-08 MED ORDER — SIMVASTATIN 40 MG PO TABS: 40.0000 mg | ORAL_TABLET | Freq: Every day | ORAL | Status: DC

## 2014-06-08 MED ORDER — SCOPOLAMINE 1 MG/3DAYS TD PT72: 1.0000 | MEDICATED_PATCH | TRANSDERMAL | Status: DC

## 2014-06-08 MED ORDER — CETIRIZINE HCL 10 MG PO TABS: 10.0000 mg | ORAL_TABLET | Freq: Every day | ORAL | Status: DC

## 2014-06-08 NOTE — Progress Notes (Signed)
Subjective:   HPI  [name] is a 80 y.o. female who presents for a complete physical.  Doing well, no particular c/o other than thinks BP medication too strong.  BPs running 90-110 SBP.   DBP 60-80.  Going on a cruise to [place] soon with son and his family.  Taking swimming lessons for the first time.  Medical care team includes:  [name], cardiology, just saw recently, stable, doing well  Dr. Gershon Crane, ophthalmology  Dr. Darrick Grinder, dentist  Dorothea Ogle, PA-C here for primary care   Preventative care: Last ophthalmology visit:YES DR. Marjory Sneddon 3/ 2015 Last dental visit: Dr. Darrick Grinder Last colonoscopy:10/06/09 NEXT ONE IN 7 YRS Last mammogram:12/2013 Last gynecological exam:06/2011 Last EKG:02/2014 Last labs:05/2013 BONE DENSITY: 05/2013  Prior vaccinations: TD or Tdap:05/2009 Influenza:2014 Pneumococcal:04/2012 Shingles/Zostavax:N/A  Advanced directive: Health care power of attorney:YES Living will:YES   Reviewed their medical, surgical, family, social, medication, and allergy history and updated chart as appropriate.  Past Medical History  Diagnosis Date  . Anxiety   . Allergy   . Dyslipidemia   . Hypertension   . Lactose intolerance   . Menopausal problem   . 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 Rollene Fare  . GERD (gastroesophageal reflux disease)     hx/o, resolved  . Cataract     hx/o surgery, both eyes; Dr. Gershon Crane  . Diverticulosis     per 2010 colonoscopy  . History of cardiovascular stress test 06/22/2010    normal bruce myocadial perfusion study, 72% EF; Dr. Gwenlyn Found  . H/O echocardiogram 06/22/2010    mild aortic valve stenosis, left ventricular normal, EF 55%; Dr. Gwenlyn Found  . History of mammogram     benign bilat calcifications, heterogeneously dense breasts, stable mammograms 2009-2014  . H/O bone density study 05/10/2006    normal bone density  . History of uterine cancer 12/2002     s/p TAH and BSO, pelvic and periarotic lymphadenectomy 12/2002, no adjuvant therapy; stage 1b carcinosarcoma of uterus, completed 5 years of f/u as of 2008; Dr. Marti Sleigh gyencology oncology [place]; Dr. Lorriane Shire gynecology  . SVT (supraventricular tachycardia)     asymptomatic 2014, hx/o SVT, Dr. Rollene Fare  . History of pneumococcal vaccination   . Full dentures     Past Surgical History  Procedure Laterality Date  . Colonoscopy  10/06/2009    scattered diverticla, medium hemorrhoids; Dr. Benson Norway  . Total hip arthroplasty Bilateral 2008, 2010     twice left (complication on the left, once on the right; Dr. Alphonzo Severance  . Cataract extraction      bilat  . Abdominal hysterectomy Bilateral     h/o uterine cancer; oophrectomy  . Transthoracic echocardiogram  02/2013    ordered for SVT; EF 55-65%; calcified MV annulus, mod MR; RSVP increased; RA mildly dilated; mod TR; PA peak pressure 48mmHg  . Nm myocar perf wall motion      History   Social History  . Marital Status: Married    Spouse Name: N/A    Number of Children: 1  . Years of Education: N/A   Occupational History  . Not on file.   Social History Main Topics  . Smoking status: Never Smoker   . Smokeless tobacco: Never Used  . Alcohol Use: No  . Drug Use: No  . Sexual Activity: No     Comment: walks 3x/wk, retired, married, 2 grandchildren   Other  Topics Concern  . Not on file   Social History Narrative   Married, 1 son, 2 grandchildren, ages 31yo and 40yo; exercise 4 days per week - walking, mall and [employer].  Husband has hx/o CVA, but is ambulatory.    No family history on file.  Current outpatient prescriptions:aspirin 81 MG tablet, Take 1 tablet (81 mg total) by mouth daily., Disp: 90 tablet, Rfl: 3;  atenolol (TENORMIN) 50 MG tablet, Take 1 tablet (50 mg total) by mouth daily., Disp: 90 tablet, Rfl: 1;  cetirizine (ZYRTEC) 10 MG tablet, Take 1 tablet (10 mg total) by mouth at bedtime., Disp: 90  tablet, Rfl: 3;  DimenhyDRINATE (MOTION SICKNESS PO), Take by mouth as needed., Disp: , Rfl:  ipratropium (ATROVENT) 0.03 % nasal spray, Place 2 sprays into both nostrils every 12 (twelve) hours., Disp: 30 mL, Rfl: 2;  lisinopril-hydrochlorothiazide (PRINZIDE,ZESTORETIC) 10-12.5 MG per tablet, Take 1 tablet by mouth daily., Disp: 90 tablet, Rfl: 1;  Multiple Vitamin (MULTIVITAMIN) tablet, Take 1 tablet by mouth daily., Disp: , Rfl: ;  simvastatin (ZOCOR) 40 MG tablet, Take 1 tablet (40 mg total) by mouth at bedtime., Disp: 90 tablet, Rfl: 3 scopolamine (TRANSDERM-SCOP) 1 MG/3DAYS, Place 1 patch (1.5 mg total) onto the skin every 3 (three) days., Disp: 10 patch, Rfl: 0  No Known Allergies   Review of Systems Constitutional: -fever, -chills, -sweats, -unexpected weight change, -decreased appetite, -fatigue Allergy: -sneezing, -itching, -congestion Dermatology: -changing moles, --rash, -lumps ENT: -runny nose, -ear pain, -sore throat, -hoarseness, -sinus pain, -teeth pain, - ringing in ears, -hearing loss, -nosebleeds Cardiology: -chest pain, -palpitations, -swelling, -difficulty breathing when lying flat, -waking up short of breath Respiratory: -cough, -shortness of breath, -difficulty breathing with exercise or exertion, -wheezing, -coughing up blood Gastroenterology: -abdominal pain, -nausea, -vomiting, -diarrhea, -constipation, -blood in stool, -changes in bowel movement, -difficulty swallowing or eating Hematology: -bleeding, -bruising  Musculoskeletal: -joint aches, -muscle aches, -joint swelling, -back pain, -neck pain, -cramping, -changes in gait Ophthalmology: denies vision changes, eye redness, itching, discharge Urology: -burning with urination, -difficulty urinating, -blood in urine, -urinary frequency, -urgency, -incontinence Neurology: -headache, -weakness, -tingling, -numbness, -memory loss, -falls, -dizziness Psychology: -depressed mood, -agitation, -sleep problems     Objective:    Physical Exam  BP 98/60  Pulse 60  Temp(Src) 97.8 F (36.6 C) (Oral)  Resp 16  Ht 5' 2.5" (1.588 m)  Wt 163 lb (73.936 kg)  BMI 29.32 kg/m2   BP Readings from Last 3 Encounters:  06/08/14 98/60  03/11/14 100/60  02/24/14 120/80   General appearance: alert, no distress, WD/WN, pleasant AA female Skin: left dorsal forearm scar from prior laceration, no worrisome lesions  HEENT: normocephalic, conjunctiva/corneas normal, sclerae anicteric, PERRLA, EOMi, nares patent, no discharge or erythema, pharynx normal  Oral cavity: MMM, tongue normal, teeth - dentures upper and lower  Neck: supple, no lymphadenopathy, no thyromegaly, no masses, normal ROM, no bruits  Chest: non tender, normal shape and expansion  Heart: RRR, normal S1, S2, no murmurs  Lungs: CTA bilaterally, no wheezes, rhonchi, or rales  Abdomen: +bs, soft, vertical lower abdominal surgical scar, non tender, non distended, no masses, no hepatomegaly, no splenomegaly, no bruits  Back: non tender, normal ROM, no scoliosis  Musculoskeletal: mildly reduced left hip internal ROM, otherwise upper extremities non tender, no obvious deformity, normal ROM throughout, lower extremities non tender, no obvious deformity, normal ROM throughout  Extremities: +mild bilat lower leg varicosities, no edema, no cyanosis, no clubbing  Pulses: 2+ symmetric, upper and lower extremities,  normal cap refill  Neurological: alert, oriented x 3, CN2-12 intact, strength normal upper extremities and lower extremities, sensation normal throughout, DTRs 2+ throughout, no cerebellar signs, gait normal  Psychiatric: normal affect, behavior normal, pleasant  Breast: nontender, no masses or lumps, no skin changes, no nipple discharge or inversion, no axillary lymphadenopathy Gyn: external genitalia with atrophic changes, vagina with normal mucosa, s/p hysterectomy, no abnormal vaginal discharge.  bimanual nontender, no masses.  Exam chaperoned by nurse. Rectal:  anus normal tone, no abnormality, occult negative stool  Assessment and Plan :    Encounter Diagnoses  Name Primary?  . Routine general medical examination at a health care facility Yes  . Essential hypertension, benign   . Dyslipidemia   . Motion sickness, initial encounter      Physical exam - discussed healthy lifestyle, diet, exercise, preventative care, vaccinations, and addressed their concerns.  Handout given.  See eye doctor and dentis yearly.  HTN - per her report BPs at home are at goal.  C/t same medication Dyslipidemia - reviewed prior labs at goal, c/t same medication Advised shingles vaccine.  She will check insurance coverage. She will return in a month after her cruise for [place].  She gets yearly flu shot. Upcoming cruise- will use trial of Scopolamine patch.  disucssed proper use, risks/benefits.  Follow-up pending labs.

## 2014-06-09 ENCOUNTER — Other Ambulatory Visit: Payer: Self-pay | Admitting: Medical

## 2014-06-09 LAB — VITAMIN D 25 HYDROXY (VIT D DEFICIENCY, FRACTURES): VIT D 25 HYDROXY: 18 ng/mL — AB (ref 30–89)

## 2014-06-09 MED ORDER — LISINOPRIL 10 MG PO TABS: 10.0000 mg | ORAL_TABLET | Freq: Every day | ORAL | Status: DC

## 2014-06-09 MED ORDER — VITAMIN D (ERGOCALCIFEROL) 1.25 MG (50000 UNIT) PO CAPS: 50000.0000 [IU] | ORAL_CAPSULE | ORAL | Status: DC

## 2014-06-09 MED ORDER — ATENOLOL 50 MG PO TABS: 50.0000 mg | ORAL_TABLET | Freq: Every day | ORAL | Status: DC

## 2014-06-12 ENCOUNTER — Telehealth: Payer: Self-pay | Admitting: Medical

## 2014-06-14 NOTE — Telephone Encounter (Signed)
P.A. Approved til 12/16/14, faxed pharmacy, pt informed

## 2014-07-28 ENCOUNTER — Other Ambulatory Visit: Payer: Self-pay | Admitting: Family Medicine

## 2014-07-28 ENCOUNTER — Telehealth: Payer: Self-pay | Admitting: Internal Medicine

## 2014-07-28 MED ORDER — LISINOPRIL 10 MG PO TABS: 10.0000 mg | ORAL_TABLET | Freq: Every day | ORAL | Status: DC

## 2014-07-28 NOTE — Telephone Encounter (Signed)
Refill request for lisinopril-hctz 10-12.5mg  #90 to [facility] [address]

## 2014-07-28 NOTE — Telephone Encounter (Signed)
Rx refill sent. CLS

## 2014-07-29 ENCOUNTER — Other Ambulatory Visit: Payer: Self-pay | Admitting: Medical

## 2014-07-29 NOTE — Telephone Encounter (Signed)
Ok to RF? 

## 2014-07-30 ENCOUNTER — Other Ambulatory Visit: Payer: Self-pay | Admitting: Family Medicine

## 2014-07-30 MED ORDER — LISINOPRIL 10 MG PO TABS: 10.0000 mg | ORAL_TABLET | Freq: Every day | ORAL | Status: DC

## 2014-07-30 NOTE — Telephone Encounter (Signed)
Rx was sent to her pharmacy. CLS

## 2014-07-30 NOTE — Telephone Encounter (Signed)
pls refill the medication

## 2014-10-21 ENCOUNTER — Ambulatory Visit (INDEPENDENT_AMBULATORY_CARE_PROVIDER_SITE_OTHER): Payer: Medicare PPO | Admitting: Medical

## 2014-10-21 ENCOUNTER — Encounter: Payer: Self-pay | Admitting: Medical

## 2014-10-21 VITALS — BP 112/74 | HR 76 | Temp 97.9°F | Resp 15 | Ht 62.0 in | Wt 163.0 lb

## 2014-10-21 DIAGNOSIS — E559 Vitamin D deficiency, unspecified: Secondary | ICD-10-CM

## 2014-10-21 DIAGNOSIS — I10 Essential (primary) hypertension: Secondary | ICD-10-CM

## 2014-10-21 DIAGNOSIS — Z23 Encounter for immunization: Secondary | ICD-10-CM

## 2014-10-21 DIAGNOSIS — S29011A Strain of muscle and tendon of front wall of thorax, initial encounter: Secondary | ICD-10-CM

## 2014-10-21 MED ORDER — LISINOPRIL 10 MG PO TABS: 10.0000 mg | ORAL_TABLET | Freq: Every day | ORAL | Status: DC

## 2014-10-21 NOTE — Progress Notes (Signed)
Subjective: Here for several issues.  Here for f/u on Vit D deficiency.   Has been taking ergocalciferol 50k weekly x 57mo, but not taking daily OTC Vit D.  No concerns  HTN - compliant with medication without c/o.  Checks BPs, SBP 90-120, DBP 60-80s.   No dizziness, lightheadedness, no chest pain, no edema.   She c/o tightness in left chest, congestion?  Has some runny nose x few days.   Was moving something heavy this week, feels soreness in left chest wall. No NVD, no diaphroesis.    Wants a flu vaccine today.  curious about pneumonia vaccine.   Past Medical History  Diagnosis Date  . Anxiety   . Allergy   . Dyslipidemia   . Hypertension   . Lactose intolerance   . Menopausal problem   . 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 Rollene Fare  . GERD (gastroesophageal reflux disease)     hx/o, resolved  . Cataract     hx/o surgery, both eyes; Dr. Gershon Crane  . Diverticulosis     per 2010 colonoscopy  . History of cardiovascular stress test 06/22/2010    normal bruce myocadial perfusion study, 72% EF; Dr. Gwenlyn Found  . H/O echocardiogram 06/22/2010    mild aortic valve stenosis, left ventricular normal, EF 55%; Dr. Gwenlyn Found  . History of mammogram     benign bilat calcifications, heterogeneously dense breasts, stable mammograms 2009-2014  . H/O bone density study 05/10/2006    normal bone density  . 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 Marti Sleigh gyencology oncology [place]; Dr. Lorriane Shire gynecology  . SVT (supraventricular tachycardia)     asymptomatic 2014, hx/o SVT, Dr. Rollene Fare  . History of pneumococcal vaccination   . Full dentures    ROS as in subjective  Objective: Filed Vitals:   10/21/14 1116  BP: 112/74  Pulse: 76  Temp: 97.9 F (36.6 C)  Resp:  15   Wt Readings from Last 3 Encounters:  10/21/14 163 lb (73.936 kg)  06/08/14 163 lb (73.936 kg)  03/11/14 163 lb 11.2 oz (74.254 kg)   BP Readings from Last 3 Encounters:  10/21/14 112/74  06/08/14 98/60  03/11/14 100/60   General appearance: alert, no distress, WD/WN  HEENT: normocephalic, sclerae anicteric, TMs pearly, nares patent, no discharge or erythema, pharynx normal Oral cavity: MMM, no lesions Neck: supple, no lymphadenopathy, no thyromegaly, no masses, no bruits Chest: nontender MSK: normal UE ROM , nontender, no deformity Heart:  regular rate and rhythm, S1, S2 normal and systolic murmur: holosystolic 3/6, blowing at apex  Lungs: CTA bilaterally, no wheezes, rhonchi, or rales Pulses: 2+ symmetric, upper and lower extremities, normal cap refill Ext: no edema    Assessment:  Encounter Diagnoses  Name Primary?  . Vitamin D deficiency Yes  . Essential hypertension   . Need for prophylactic vaccination and inoculation against influenza   . Chest wall muscle strain, initial encounter     Plan: Vit D deficiency - she completed prescription ergocalciferol x 37mo, begin OTC Vit D 1000 daily ongoing, Ca 1200-1500mg  with diet and or supplementation HTN - c/t current regimen Counseled on the influenza virus vaccine.  Vaccine information sheet given.  Influenza vaccine given after consent obtained. Chest wall strain - advised heat, relative  rest, tylenol, recheck if not improving in 2 wk.

## 2014-12-15 ENCOUNTER — Ambulatory Visit (INDEPENDENT_AMBULATORY_CARE_PROVIDER_SITE_OTHER): Payer: Medicare PPO | Admitting: Medical

## 2014-12-15 ENCOUNTER — Encounter: Payer: Self-pay | Admitting: Medical

## 2014-12-15 VITALS — BP 110/62 | HR 60 | Temp 97.7°F | Resp 16 | Wt 161.0 lb

## 2014-12-15 DIAGNOSIS — J988 Other specified respiratory disorders: Secondary | ICD-10-CM

## 2014-12-15 DIAGNOSIS — J22 Unspecified acute lower respiratory infection: Secondary | ICD-10-CM

## 2014-12-15 MED ORDER — AZITHROMYCIN 250 MG PO TABS: ORAL_TABLET | ORAL | Status: DC

## 2014-12-15 MED ORDER — HYDROCODONE-HOMATROPINE 5-1.5 MG/5ML PO SYRP: 5.0000 mL | ORAL_SOLUTION | Freq: Three times a day (TID) | ORAL | Status: DC | PRN

## 2014-12-15 NOTE — Progress Notes (Signed)
Subjective:  [name] is a 80 y.o. female who presents for worsening chest cold.  Has chest cold for 1 week, lots of cough, worsening in chest.  Deep cough.  No sore throat.  No ear pain, no sinus pressure.  No fever, no NVD.   No sick contacts.   using some mucinex and theraflu.   She does not smoke.  No other aggravating or relieving factors.  No other c/o.  The following portions of the patient's history were reviewed and updated as appropriate: allergies, current medications, past family history, past medical history, past social history, past surgical history and problem list.  ROS as in subjective  Past Medical History  Diagnosis Date  . Anxiety   . Allergy   . Dyslipidemia   . Hypertension   . Lactose intolerance   . Menopausal problem   . 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 Rollene Fare  . GERD (gastroesophageal reflux disease)     hx/o, resolved  . Cataract     hx/o surgery, both eyes; Dr. Gershon Crane  . Diverticulosis     per 2010 colonoscopy  . History of cardiovascular stress test 06/22/2010    normal bruce myocadial perfusion study, 72% EF; Dr. Gwenlyn Found  . H/O echocardiogram 06/22/2010    mild aortic valve stenosis, left ventricular normal, EF 55%; Dr. Gwenlyn Found  . History of mammogram     benign bilat calcifications, heterogeneously dense breasts, stable mammograms 2009-2014  . H/O bone density study 05/10/2006    normal bone density  . 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 Marti Sleigh gyencology oncology [place]; Dr. Lorriane Shire gynecology  . SVT (supraventricular tachycardia)     asymptomatic 2014, hx/o SVT, Dr. Rollene Fare  . History of pneumococcal vaccination   . Full dentures      Objective: BP 110/62 mmHg  Pulse 60  Temp(Src) 97.7 F  (36.5 C) (Oral)  Resp 16  Wt 161 lb (73.029 kg)  General appearance: Alert, WD/WN, no distress,mildly ill appearing                             Skin: warm, no rash, no diaphoresis                           Head: no sinus tenderness                            Eyes: conjunctiva normal, corneas clear, PERRLA                            Ears: pearly TMs, external ear canals normal                          Nose: septum midline, turbinates swollen, with erythema and clear discharge             Mouth/throat: MMM, tongue normal, mild pharyngeal erythema                           Neck: supple, no adenopathy, no thyromegaly,  nontender                          Heart: RRR, normal S1, S2, no murmurs                         Lungs: +bronchial breath sounds, +scattered rhonchi right mid field, no wheezes, no rales                Extremities: no edema, nontender     Assessment: Encounter Diagnosis  Name Primary?  . Acute respiratory infection Yes    Plan:  Medication orders today include: Zpak, Hycodan syrup.  Discussed diagnosis and treatment.  Suggested symptomatic OTC remedies for cough and congestion.  Tylenol  OTC for fever and malaise.  Call/return in 2-3 days if symptoms are worse or not improving.  Advised that cough may linger even after the infection is improved.

## 2014-12-17 DIAGNOSIS — H409 Unspecified glaucoma: Secondary | ICD-10-CM

## 2014-12-17 HISTORY — DX: Unspecified glaucoma: H40.9

## 2014-12-27 LAB — HM MAMMOGRAPHY: HM Mammogram: NEGATIVE

## 2014-12-28 ENCOUNTER — Encounter: Payer: Self-pay | Admitting: Internal Medicine

## 2015-02-02 ENCOUNTER — Other Ambulatory Visit: Payer: Self-pay | Admitting: Medical

## 2015-03-04 ENCOUNTER — Encounter: Payer: Self-pay | Admitting: Internal Medicine

## 2015-03-04 ENCOUNTER — Ambulatory Visit (INDEPENDENT_AMBULATORY_CARE_PROVIDER_SITE_OTHER): Payer: Medicare PPO | Admitting: Internal Medicine

## 2015-03-04 VITALS — BP 120/64 | HR 58 | Ht 62.0 in | Wt 162.9 lb

## 2015-03-04 DIAGNOSIS — I081 Rheumatic disorders of both mitral and tricuspid valves: Secondary | ICD-10-CM

## 2015-03-04 DIAGNOSIS — I471 Supraventricular tachycardia: Secondary | ICD-10-CM

## 2015-03-04 DIAGNOSIS — I456 Pre-excitation syndrome: Secondary | ICD-10-CM

## 2015-03-04 DIAGNOSIS — I10 Essential (primary) hypertension: Secondary | ICD-10-CM

## 2015-03-04 DIAGNOSIS — E785 Hyperlipidemia, unspecified: Secondary | ICD-10-CM

## 2015-03-04 NOTE — Progress Notes (Signed)
OFFICE NOTE  Chief Complaint:  No complaints  Primary Care Physician: Crisoforo Oxford, PA-C  HPI:  [name] is a pleasant 81 year old female previously followed by Dr. Rollene Fare. She's here today to establish cardiac care with me. She has history of hypertension, remote bilateral hip placements and redo hip surgery for failed prosthesis in 2011. 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 She was previously on flecainide but has been taken off of that. She's had no recurrence of her palpitations on beta blockers. She had a negative Myoview for ischemia in 2011 an echocardiogram in 2014 which showed normal systolic function and moderate mitral regurgitation.  She has no complaints today.  The pleasure see [name] back in the office today. She is without any complaints. She continues to be active and denies any worsening shortness of breath or chest pain. She's had no further recurrent palpitations. Heart rate seems well controlled on atenolol. Recently she's had some issues with lower blood pressure and her lisinopril HCTZ was changed to lisinopril. This is associated with a small increase in blood pressure to the normal range. She now feels well again. Should be noted she had an echocardiogram in 2014 which showed moderate MR and moderate TR.  PMHx:  Past Medical History  Diagnosis Date  . Anxiety   . Allergy   . Dyslipidemia   . Hypertension   . Lactose intolerance   . Menopausal problem   . 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 Rollene Fare  . GERD (gastroesophageal reflux disease)     hx/o, resolved  . Cataract     hx/o surgery, both eyes; Dr. Gershon Crane  . Diverticulosis     per 2010 colonoscopy  . History of cardiovascular stress test 06/22/2010    normal bruce myocadial perfusion study, 72% EF; Dr. Gwenlyn Found  . H/O echocardiogram 06/22/2010    mild aortic valve  stenosis, left ventricular normal, EF 55%; Dr. Gwenlyn Found  . History of mammogram     benign bilat calcifications, heterogeneously dense breasts, stable mammograms 2009-2014  . H/O bone density study 05/10/2006    normal bone density  . 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 Marti Sleigh gyencology oncology [place]; Dr. Lorriane Shire gynecology  . SVT (supraventricular tachycardia)     asymptomatic 2014, hx/o SVT, Dr. Rollene Fare  . History of pneumococcal vaccination   . Full dentures     Past Surgical History  Procedure Laterality Date  . Colonoscopy  10/06/2009    scattered diverticla, medium hemorrhoids; Dr. Benson Norway  . Total hip arthroplasty Bilateral 2008, 2010     twice left (complication on the left, once on the right; Dr. Alphonzo Severance  . Cataract extraction      bilat  . Abdominal hysterectomy Bilateral     h/o uterine cancer; oophrectomy  . Transthoracic echocardiogram  02/2013    ordered for SVT; EF 55-65%; calcified MV annulus, mod MR; RSVP increased; RA mildly dilated; mod TR; PA peak pressure 40mmHg  . Nm myocar perf wall motion      FAMHx:  No family history on file.  SOCHx:   reports that she has never smoked. She has never used smokeless tobacco. She reports that she  does not drink alcohol or use illicit drugs.  ALLERGIES:  No Known Allergies  ROS: A comprehensive review of systems was negative.  HOME MEDS: Current Outpatient Prescriptions  Medication Sig Dispense Refill  . aspirin 81 MG tablet Take 1 tablet (81 mg total) by mouth daily. 90 tablet 3  . atenolol (TENORMIN) 50 MG tablet Take 1 tablet (50 mg total) by mouth daily. 90 tablet 3  . cetirizine (ZYRTEC) 10 MG tablet Take 1 tablet (10 mg total) by mouth at bedtime. 90 tablet 3  . DimenhyDRINATE (MOTION SICKNESS PO) Take by mouth as needed.      Marland Kitchen HYDROcodone-homatropine (HYCODAN) 5-1.5 MG/5ML syrup Take 5 mLs by mouth every 8 (eight) hours as needed for cough. 120 mL 0  . ipratropium (ATROVENT) 0.03 % nasal spray Place 2 sprays into both nostrils every 12 (twelve) hours. 30 mL 2  . lisinopril-hydrochlorothiazide (PRINZIDE,ZESTORETIC) 10-12.5 MG per tablet Take 1 tablet by mouth daily.  1  . simvastatin (ZOCOR) 40 MG tablet Take 1 tablet (40 mg total) by mouth at bedtime. 90 tablet 3   No current facility-administered medications for this visit.    LABS/IMAGING: No results found for this or any previous visit (from the past 48 hour(s)). No results found.  VITALS: BP 120/64 mmHg  Pulse 58  Ht 5\' 2"  (1.575 m)  Wt 162 lb 14.4 oz (73.891 kg)  BMI 29.79 kg/m2  EXAM: General appearance: alert and no distress Neck: no carotid bruit, no JVD and thyroid not enlarged, symmetric, no tenderness/mass/nodules Lungs: clear to auscultation bilaterally Heart: regular rate and rhythm, S1, S2 normal and systolic murmur: holosystolic 3/6, blowing at apex Abdomen: soft, non-tender; bowel sounds normal; no masses,  no organomegaly Extremities: extremities normal, atraumatic, no cyanosis or edema Pulses: 2+ and symmetric Skin: Skin color, texture, turgor normal. 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 Hypertension 4. Dyslipidemia 5. Moderate MR and moderate TR  PLAN: 1.   [name] has not had significant recurrence of her palpitations with beta blockers. Her blood pressure is well-controlled on her current medication regimen. Her cholesterol has been at goal on simvastatin. She does have moderate MR and TR and was last assessed by echo in 2014. She will be due for repeat echocardiogram next year. Plan to continue her current medications and we'll see her back annually or sooner  as necessary.  Pixie Casino, MD, [facility] Attending Cardiologist [facility]  Tommaso Cavitt C 03/04/2015, 9:58 AM

## 2015-03-04 NOTE — Patient Instructions (Signed)
Your physician wants you to follow-up in: 1 year with Dr. Hilty. You will receive a reminder letter in the mail two months in advance. If you don't receive a letter, please call our office to schedule the follow-up appointment.  

## 2015-03-28 DIAGNOSIS — Z961 Presence of intraocular lens: Secondary | ICD-10-CM | POA: Diagnosis not present

## 2015-03-28 DIAGNOSIS — H40013 Open angle with borderline findings, low risk, bilateral: Secondary | ICD-10-CM | POA: Diagnosis not present

## 2015-04-18 DIAGNOSIS — H4011X1 Primary open-angle glaucoma, mild stage: Secondary | ICD-10-CM | POA: Diagnosis not present

## 2015-05-11 DIAGNOSIS — H4011X1 Primary open-angle glaucoma, mild stage: Secondary | ICD-10-CM | POA: Diagnosis not present

## 2015-06-14 ENCOUNTER — Telehealth: Payer: Self-pay | Admitting: Medical

## 2015-06-14 ENCOUNTER — Encounter: Payer: Self-pay | Admitting: Medical

## 2015-06-14 ENCOUNTER — Other Ambulatory Visit: Payer: Self-pay | Admitting: Medical

## 2015-06-14 ENCOUNTER — Ambulatory Visit (INDEPENDENT_AMBULATORY_CARE_PROVIDER_SITE_OTHER): Payer: Medicare PPO | Admitting: Medical

## 2015-06-14 VITALS — BP 120/80 | HR 66 | Temp 97.6°F | Resp 14 | Ht 62.0 in | Wt 166.0 lb

## 2015-06-14 DIAGNOSIS — E559 Vitamin D deficiency, unspecified: Secondary | ICD-10-CM

## 2015-06-14 DIAGNOSIS — N952 Postmenopausal atrophic vaginitis: Secondary | ICD-10-CM

## 2015-06-14 DIAGNOSIS — Z0000 Encounter for general adult medical examination without abnormal findings: Secondary | ICD-10-CM | POA: Diagnosis not present

## 2015-06-14 DIAGNOSIS — I10 Essential (primary) hypertension: Secondary | ICD-10-CM

## 2015-06-14 DIAGNOSIS — J309 Allergic rhinitis, unspecified: Secondary | ICD-10-CM | POA: Insufficient documentation

## 2015-06-14 DIAGNOSIS — Z7189 Other specified counseling: Secondary | ICD-10-CM

## 2015-06-14 DIAGNOSIS — Z7185 Encounter for immunization safety counseling: Secondary | ICD-10-CM

## 2015-06-14 DIAGNOSIS — E785 Hyperlipidemia, unspecified: Secondary | ICD-10-CM | POA: Diagnosis not present

## 2015-06-14 DIAGNOSIS — I071 Rheumatic tricuspid insufficiency: Secondary | ICD-10-CM | POA: Diagnosis not present

## 2015-06-14 DIAGNOSIS — Z13820 Encounter for screening for osteoporosis: Secondary | ICD-10-CM | POA: Diagnosis not present

## 2015-06-14 DIAGNOSIS — B351 Tinea unguium: Secondary | ICD-10-CM

## 2015-06-14 DIAGNOSIS — N289 Disorder of kidney and ureter, unspecified: Secondary | ICD-10-CM

## 2015-06-14 DIAGNOSIS — I456 Pre-excitation syndrome: Secondary | ICD-10-CM

## 2015-06-14 DIAGNOSIS — I340 Nonrheumatic mitral (valve) insufficiency: Secondary | ICD-10-CM | POA: Diagnosis not present

## 2015-06-14 LAB — LIPID PANEL
Cholesterol: 138 mg/dL (ref 0–200)
HDL: 41 mg/dL — ABNORMAL LOW (ref >=46)
LDL CALC: 75 mg/dL (ref 0–99)
TRIGLYCERIDES: 109 mg/dL (ref <150)
Total CHOL/HDL Ratio: 3.4 Ratio
VLDL: 22 mg/dL (ref 0–40)

## 2015-06-14 LAB — COMPREHENSIVE METABOLIC PANEL
ALBUMIN: 3.9 g/dL (ref 3.5–5.2)
ALK PHOS: 49 U/L (ref 39–117)
ALT: 16 U/L (ref 0–35)
AST: 25 U/L (ref 0–37)
BUN: 17 mg/dL (ref 6–23)
CALCIUM: 9.2 mg/dL (ref 8.4–10.5)
CO2: 25 mEq/L (ref 19–32)
CREATININE: 0.99 mg/dL (ref 0.50–1.10)
Chloride: 107 mEq/L (ref 96–112)
GLUCOSE: 86 mg/dL (ref 70–99)
Potassium: 4.8 mEq/L (ref 3.5–5.3)
Sodium: 144 mEq/L (ref 135–145)
Total Bilirubin: 0.7 mg/dL (ref 0.2–1.2)
Total Protein: 7.4 g/dL (ref 6.0–8.3)

## 2015-06-14 LAB — CBC
HCT: 40.4 % (ref 36.0–46.0)
Hemoglobin: 13.2 g/dL (ref 12.0–15.0)
MCH: 28.9 pg (ref 26.0–34.0)
MCHC: 32.7 g/dL (ref 30.0–36.0)
MCV: 88.6 fL (ref 78.0–100.0)
MPV: 10.7 fL (ref 8.6–12.4)
Platelets: 213 10*3/uL (ref 150–400)
RBC: 4.56 MIL/uL (ref 3.87–5.11)
RDW: 14.2 % (ref 11.5–15.5)
WBC: 3.6 10*3/uL — AB (ref 4.0–10.5)

## 2015-06-14 LAB — POCT URINALYSIS DIPSTICK
BILIRUBIN UA: NEGATIVE
Blood, UA: NEGATIVE
GLUCOSE UA: NEGATIVE
Ketones, UA: NEGATIVE
Nitrite, UA: NEGATIVE
Protein, UA: NEGATIVE
Spec Grav, UA: 1.02
Urobilinogen, UA: NEGATIVE
pH, UA: 6

## 2015-06-14 MED ORDER — CETIRIZINE HCL 10 MG PO TABS: 10.0000 mg | ORAL_TABLET | Freq: Every day | ORAL | Status: DC

## 2015-06-14 MED ORDER — SIMVASTATIN 40 MG PO TABS: 40.0000 mg | ORAL_TABLET | Freq: Every day | ORAL | Status: DC

## 2015-06-14 MED ORDER — ATENOLOL 50 MG PO TABS: 50.0000 mg | ORAL_TABLET | Freq: Every day | ORAL | Status: DC

## 2015-06-14 MED ORDER — ASPIRIN 81 MG PO TABS: 81.0000 mg | ORAL_TABLET | Freq: Every day | ORAL | Status: DC

## 2015-06-14 MED ORDER — LISINOPRIL-HYDROCHLOROTHIAZIDE 10-12.5 MG PO TABS: 1.0000 | ORAL_TABLET | Freq: Every day | ORAL | Status: DC

## 2015-06-14 NOTE — Progress Notes (Signed)
Subjective:   HPI  [name] is a 81 y.o. female who presents for a complete physical, routine med check.  Doing fine.   Going to [place] on vacation with son soon.   scopolamine patch worked well last year on her cruise.  Of note, she was only child, and parents both died when she was young.     Medical care team includes: 1. [name], cardiology, just saw recently, stable, doing well 2. Dr. Gershon Crane, ophthalmology 3. Dr. Darrick Grinder, dentist 4. Lennie Odor, NP at [facility] 5. Dorothea Ogle, PA-C here for primary care   Preventative care: Last ophthalmology visit:yes see's Dr. Gershon Crane last appointment 04/2015 Last dental visit: yes see's Dr. Darrick Grinder seen 6/ 2016 Last colonoscopy:5/ 2010 Last mammogram:1/ 2016 Last gynecological exam:2015 Last EKG:2001 Last labs:2015  Prior vaccinations: TD or Tdap:2010 Influenza:2015 Pneumococcal:# 23- 2013 Shingles/Zostavax:  Advanced directive: has done this.    Concerns: Fingernail fungus, has used 2 rounds of cream per derm.  Wants nail cut off but they wont do it.  Reviewed their medical, surgical, family, social, medication, and allergy history and updated chart as appropriate.  Past Medical History  Diagnosis Date  . Anxiety   . Allergy   . Dyslipidemia   . Hypertension   . Lactose intolerance   . Menopausal problem   . 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 Rollene Fare  . GERD (gastroesophageal reflux disease)     hx/o, resolved  . Cataract     hx/o surgery, both eyes; Dr. Gershon Crane  . Diverticulosis     per 2010 colonoscopy  . History of cardiovascular stress test 06/22/2010    normal bruce myocadial perfusion study, 72% EF; Dr. Gwenlyn Found  . H/O echocardiogram 06/22/2010    mild aortic valve stenosis, left ventricular normal, EF 55%; Dr. Gwenlyn Found  . History of mammogram     benign bilat calcifications, heterogeneously dense breasts, stable mammograms 2009-2014   . H/O bone density study 05/10/2006    normal bone density  . 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 Marti Sleigh gyencology oncology [place]; Dr. Lorriane Shire gynecology  . SVT (supraventricular tachycardia)     asymptomatic 2014, hx/o SVT, Dr. Rollene Fare  . History of pneumococcal vaccination   . Full dentures   . Glaucoma 2016  . Full dentures     Past Surgical History  Procedure Laterality Date  . Colonoscopy  10/06/2009    scattered diverticla, medium hemorrhoids; Dr. Benson Norway  . Total hip arthroplasty Bilateral 2008, 2010     twice left (complication on the left, once on the right; Dr. Alphonzo Severance  . Cataract extraction      bilat  . Abdominal hysterectomy Bilateral     h/o uterine cancer; oophrectomy  . Transthoracic echocardiogram  02/2013    ordered for SVT; EF 55-65%; calcified MV annulus, mod MR; RSVP increased; RA mildly dilated; mod TR; PA peak pressure 66mmHg  . Nm myocar perf wall motion      History   Social History  . Marital Status: Married    Spouse Name: N/A  . Number of Children: 1  . Years of Education: N/A   Occupational History  . Not on file.   Social History Main Topics  . Smoking status: Never Smoker   .  Smokeless tobacco: Never Used  . Alcohol Use: No  . Drug Use: No  . Sexual Activity: No     Comment: walks 3x/wk, retired, married, 2 grandchildren   Other Topics Concern  . Not on file   Social History Narrative   Married, 1 son, 2 grandchildren, ages 47yo and 20yo; exercise 4 days per week - walking, mall and [employer].  Husband has hx/o CVA, but is ambulatory.    Family History  Problem Relation Age of Onset  . Family history unknown: Yes     Current outpatient prescriptions:  .  aspirin 81 MG tablet, Take 1 tablet (81 mg total) by mouth daily., Disp: 90 tablet, Rfl:  3 .  atenolol (TENORMIN) 50 MG tablet, Take 1 tablet (50 mg total) by mouth daily., Disp: 90 tablet, Rfl: 3 .  cetirizine (ZYRTEC) 10 MG tablet, Take 1 tablet (10 mg total) by mouth at bedtime., Disp: 90 tablet, Rfl: 3 .  ipratropium (ATROVENT) 0.03 % nasal spray, Place 2 sprays into both nostrils every 12 (twelve) hours., Disp: 30 mL, Rfl: 2 .  latanoprost (XALATAN) 0.005 % ophthalmic solution, 1 drop at bedtime., Disp: , Rfl:  .  lisinopril-hydrochlorothiazide (PRINZIDE,ZESTORETIC) 10-12.5 MG per tablet, Take 1 tablet by mouth daily., Disp: , Rfl: 1 .  simvastatin (ZOCOR) 40 MG tablet, Take 1 tablet (40 mg total) by mouth at bedtime., Disp: 90 tablet, Rfl: 3 .  DimenhyDRINATE (MOTION SICKNESS PO), Take by mouth as needed., Disp: , Rfl:  .  HYDROcodone-homatropine (HYCODAN) 5-1.5 MG/5ML syrup, Take 5 mLs by mouth every 8 (eight) hours as needed for cough. (Patient not taking: Reported on 06/14/2015), Disp: 120 mL, Rfl: 0  No Known Allergies   Review of Systems Constitutional: -fever, -chills, -sweats, -unexpected weight change, -decreased appetite, -fatigue Allergy: -sneezing, -itching, -congestion Dermatology: -changing moles, --rash, -lumps ENT: -runny nose, -ear pain, -sore throat, -hoarseness, -sinus pain, -teeth pain, - ringing in ears, -hearing loss, -nosebleeds Cardiology: -chest pain, -palpitations, -swelling, -difficulty breathing when lying flat, -waking up short of breath Respiratory: -cough, -shortness of breath, -difficulty breathing with exercise or exertion, -wheezing, -coughing up blood Gastroenterology: -abdominal pain, -nausea, -vomiting, -diarrhea, -constipation, -blood in stool, -changes in bowel movement, -difficulty swallowing or eating Hematology: -bleeding, -bruising  Musculoskeletal: -joint aches, -muscle aches, -joint swelling, -back pain, -neck pain, -cramping, -changes in gait Ophthalmology: denies vision changes, eye redness, itching, discharge Urology: -burning  with urination, -difficulty urinating, -blood in urine, -urinary frequency, -urgency, -incontinence Neurology: -headache, -weakness, -tingling, -numbness, -memory loss, -falls, -dizziness Psychology: -depressed mood, -agitation, -sleep problems     Objective:   Physical Exam  BP 120/80 mmHg  Pulse 66  Temp(Src) 97.6 F (36.4 C) (Oral)  Resp 14  Ht 5\' 2"  (1.575 m)  Wt 166 lb (75.297 kg)  BMI 30.35 kg/m2  General appearance: alert, no distress, WD/WN, pleasant AA female Skin: left and right dorsal forearm scar from prior laceration, no worrisome lesions, middle fingernail with darkened coloration and thickening suggesting nail fungus, otherwise nails normal appearing HEENT: normocephalic, conjunctiva/corneas normal, sclerae anicteric, PERRLA, EOMi, nares patent, no discharge or erythema, pharynx normal  Oral cavity: MMM, tongue normal, teeth - dentures upper and lower  Neck: supple, no lymphadenopathy, no thyromegaly, no masses, normal ROM, no bruits  Chest: non tender, normal shape and expansion  Heart: RRR, normal S1, S2, no murmurs  Lungs: CTA bilaterally, no wheezes, rhonchi, or rales  Abdomen: +bs, soft, vertical lower abdominal surgical scar, non tender, non distended, no masses, no  hepatomegaly, no splenomegaly, no bruits  Back: non tender, normal ROM, no scoliosis  Musculoskeletal: mildly reduced left hip internal ROM, otherwise upper extremities non tender, no obvious deformity, normal ROM throughout, lower extremities non tender, no obvious deformity, normal ROM throughout  Extremities: +mild bilat lower leg varicosities, no edema, no cyanosis, no clubbing  Pulses: 2+ symmetric, upper and lower extremities, normal cap refill  Neurological: alert, oriented x 3, CN2-12 intact, strength normal upper extremities and lower extremities, sensation normal throughout, DTRs 2+ throughout, no cerebellar signs, gait normal  Psychiatric: normal affect, behavior normal, pleasant   Breast: nontender, no masses or lumps, no skin changes, no nipple discharge or inversion, no axillary lymphadenopathy Gyn: external genitalia with atrophic changes, vagina with normal mucosa, s/p hysterectomy, no abnormal vaginal discharge. bimanual nontender, no masses. Exam chaperoned by nurse. Rectal: normal appearing     Assessment and Plan :    Encounter Diagnoses  Name Primary?  . Essential hypertension Yes  . Dyslipidemia   . Routine general medical examination at a health care facility   . Lown Ganong Levine syndrome   . Moderate mitral regurgitation   . Moderate tricuspid regurgitation   . Vitamin D deficiency   . Vaccine counseling   . Allergic rhinitis, unspecified allergic rhinitis type   . Renal insufficiency   . Onychomycosis   . Screening for osteoporosis   . Atrophic vaginitis      Physical exam - discussed healthy lifestyle, diet, exercise, preventative care, vaccinations, and addressed their concerns.  Handout given. See your eye doctor yearly for routine vision care. See your dentist yearly for routine dental care including hygiene visits twice yearly. See cardiology yearly, due for echo next year She will call Dr. Benson Norway.  Chart notes say repeat colonoscopy 2017, but she recalls 2020. Recommended shingles and prevnar 13 vaccines.  She will consider.  Declines today.  Get yearly flu shot. Compliant with medications, routine labs today I will call dermatology about her nail concerns.   She gets mammogram yearly.    Discussed repeating bone density scan which we will set up.  Last one was 2007.  Osteoporosis risk factors include hx/o Vit D deficiency, post menopausal. Reviewed recent cardiology notes. C/t OTC vit D daily, calcium 1200mg  daily, healthy diet, routine exercise.    Patient Instructions  Recommendations:  Return soon for Prevnar 13 pneumococcal vaccine  Get a flu shot yearly in September  Consider shingles vaccine at [facility].   It may be cheaper there.  I will call dermatology about your nail concerns  We will refer you for updated Bone Density scan  Call Dr. Ulyses Amor office about when your next colonoscopy should be.  My records show 2017.  Your last colonoscopy was 2010.   Exercise regularly  Eat a healthy diet  Continue your medications as usual   Follow-up pending labs

## 2015-06-14 NOTE — Telephone Encounter (Signed)
Refer for bone density scan.  Send copy of 2007 scan.     Call Lennie Odor at [facility] dermatology and ask her to call me back in reference to this patient.

## 2015-06-14 NOTE — Telephone Encounter (Signed)
Patient is aware of the appointment for the bone density scan. I left a message at [facility] for Lennie Odor to call Audelia Acton T. Back at the office

## 2015-06-14 NOTE — Patient Instructions (Signed)
Recommendations:  Return soon for Prevnar 13 pneumococcal vaccine  Get a flu shot yearly in September  Consider shingles vaccine at [facility].  It may be cheaper there.  I will call dermatology about your nail concerns  We will refer you for updated Bone Density scan  Call Dr. Ulyses Amor office about when your next colonoscopy should be.  My records show 2017.  Your last colonoscopy was 2010.   Exercise regularly  Eat a healthy diet  Continue your medications as usual

## 2015-06-15 LAB — VITAMIN D 25 HYDROXY (VIT D DEFICIENCY, FRACTURES): VIT D 25 HYDROXY: 46 ng/mL (ref 30–100)

## 2015-06-17 ENCOUNTER — Telehealth: Payer: Self-pay | Admitting: Medical

## 2015-06-17 NOTE — Telephone Encounter (Signed)
Pt informed of labs and notes from shane

## 2015-06-22 ENCOUNTER — Other Ambulatory Visit: Payer: Medicare PPO

## 2015-06-30 ENCOUNTER — Telehealth: Payer: Self-pay | Admitting: Family Medicine

## 2015-06-30 NOTE — Telephone Encounter (Signed)
Patient has an appointment to see Dr. Grandville Silos on 07/21/15 @ 1000 am I mailed the patient a copy of all of the appointment details. [facility] in [place], [place]  Address: [address] [place], [place] [address]  Phone: [phone]

## 2015-07-11 ENCOUNTER — Inpatient Hospital Stay: Admission: RE | Admit: 2015-07-11 | Payer: Medicare PPO | Source: Ambulatory Visit

## 2015-07-11 DIAGNOSIS — Z780 Asymptomatic menopausal state: Secondary | ICD-10-CM | POA: Diagnosis not present

## 2015-07-11 DIAGNOSIS — M810 Age-related osteoporosis without current pathological fracture: Secondary | ICD-10-CM | POA: Diagnosis not present

## 2015-07-11 LAB — HM DEXA SCAN

## 2015-07-21 ENCOUNTER — Encounter: Payer: Self-pay | Admitting: Internal Medicine

## 2015-07-21 DIAGNOSIS — B351 Tinea unguium: Secondary | ICD-10-CM | POA: Diagnosis not present

## 2015-07-29 ENCOUNTER — Other Ambulatory Visit: Payer: Self-pay | Admitting: Medical

## 2015-08-04 DIAGNOSIS — B351 Tinea unguium: Secondary | ICD-10-CM | POA: Diagnosis not present

## 2015-08-11 DIAGNOSIS — H4011X1 Primary open-angle glaucoma, mild stage: Secondary | ICD-10-CM | POA: Diagnosis not present

## 2015-09-15 ENCOUNTER — Other Ambulatory Visit (INDEPENDENT_AMBULATORY_CARE_PROVIDER_SITE_OTHER): Payer: Medicare PPO

## 2015-09-15 DIAGNOSIS — Z23 Encounter for immunization: Secondary | ICD-10-CM

## 2015-10-31 DIAGNOSIS — H401131 Primary open-angle glaucoma, bilateral, mild stage: Secondary | ICD-10-CM | POA: Diagnosis not present

## 2015-12-01 DIAGNOSIS — B351 Tinea unguium: Secondary | ICD-10-CM | POA: Diagnosis not present

## 2015-12-01 DIAGNOSIS — M67442 Ganglion, left hand: Secondary | ICD-10-CM | POA: Diagnosis not present

## 2015-12-05 ENCOUNTER — Encounter: Payer: Self-pay | Admitting: Family Medicine

## 2015-12-05 ENCOUNTER — Ambulatory Visit (INDEPENDENT_AMBULATORY_CARE_PROVIDER_SITE_OTHER): Payer: Medicare PPO | Admitting: Family Medicine

## 2015-12-05 VITALS — BP 118/68 | HR 64 | Temp 98.1°F | Resp 16 | Wt 161.4 lb

## 2015-12-05 DIAGNOSIS — R05 Cough: Secondary | ICD-10-CM

## 2015-12-05 DIAGNOSIS — R059 Cough, unspecified: Secondary | ICD-10-CM

## 2015-12-05 DIAGNOSIS — J069 Acute upper respiratory infection, unspecified: Secondary | ICD-10-CM

## 2015-12-05 MED ORDER — AZITHROMYCIN 250 MG PO TABS: ORAL_TABLET | ORAL | Status: DC

## 2015-12-05 MED ORDER — HYDROCODONE-HOMATROPINE 5-1.5 MG/5ML PO SYRP: 5.0000 mL | ORAL_SOLUTION | Freq: Every evening | ORAL | Status: DC | PRN

## 2015-12-05 NOTE — Progress Notes (Signed)
   Subjective:    Patient ID: [name], female    DOB: [birth year], 81 y.o.   MRN: [id number]  HPI Chief Complaint  Patient presents with  . sick    cold, cough, congestion, running nose. mucous. sinus pressure   She is here with complaints of post nasal drainage, upper chest congestion and cough producing phlegm. States she is coughing a lot at night. States illness starts 2 weeks ago with hoarseness, scratchy throat, rhinorrhea, and upper respiratory congestion. She states she is approximately 40 % improved since onset of illness but cough remains and is keeping her up at night.   Denies fever, chills, body aches, fatigue ear aches, sinus pain, urinary or GI symptoms. No tightness in chest or wheezing. She does not smoke. Husband was sick with cold.  She is current with flu and pneumonia shots.  Allergies allergies, medications, past medical and social history.  Review of Systems Pertinent positives and negatives in the history of present illness.    Objective:   Physical Exam BP 118/68 mmHg  Pulse 64  Temp(Src) 98.1 F (36.7 C) (Oral)  Resp 16  Wt 161 lb 6.4 oz (73.211 kg)  Alert and in no distress. No sinus tenderness. Tympanic membranes and canals are normal. Pharyngeal area is mildly erythematous without edema or exudate. Neck is supple without adenopathy. Cardiac exam shows a regular rate and rhythm. Lungs are clear to auscultation. No lower extremity edema.      Assessment & Plan:  Acute upper respiratory infection - Plan: HYDROcodone-homatropine (HYCODAN) 5-1.5 MG/5ML syrup  Cough - Plan: HYDROcodone-homatropine (HYCODAN) 5-1.5 MG/5ML syrup  Suspect that her cough is related to URI and continued drainage. Will treat with Z pak and hycodan PRN bedtime. Patient states she has done well with both in the past. She will follow up if no improvement in next 2-3 days and if not better after completing the antibiotic. Also discussed symptomatic treatment and staying well  hydrated.

## 2015-12-05 NOTE — Patient Instructions (Signed)
Start using your nasal spray again if you are having congestion and drainage. Stay well hydrated.  Use the cough medication if needed at bedtime but be aware that this is a narcotic medication and use caution when taking this medicine. No driving or drinking alcohol. Do not take more than prescribed.  Let us know if not back to normal after completing the antibiotic.   Upper Respiratory Infection, Adult Most upper respiratory infections (URIs) are a viral infection of the air passages leading to the lungs. A URI affects the nose, throat, and upper air passages. The most common type of URI is nasopharyngitis and is typically referred to as "the common cold." URIs run their course and usually go away on their own. Most of the time, a URI does not require medical attention, but sometimes a bacterial infection in the upper airways can follow a viral infection. This is called a secondary infection. Sinus and middle ear infections are common types of secondary upper respiratory infections. Bacterial pneumonia can also complicate a URI. A URI can worsen asthma and chronic obstructive pulmonary disease (COPD). Sometimes, these complications can require emergency medical care and may be life threatening.  CAUSES Almost all URIs are caused by viruses. A virus is a type of germ and can spread from one person to another.  RISKS FACTORS You may be at risk for a URI if:   You smoke.   You have chronic heart or lung disease.  You have a weakened defense (immune) system.   You are very young or very old.   You have nasal allergies or asthma.  You work in crowded or poorly ventilated areas.  You work in health care facilities or schools. SIGNS AND SYMPTOMS  Symptoms typically develop 2-3 days after you come in contact with a cold virus. Most viral URIs last 7-10 days. However, viral URIs from the influenza virus (flu virus) can last 14-18 days and are typically more severe. Symptoms may include:    Runny or stuffy (congested) nose.   Sneezing.   Cough.   Sore throat.   Headache.   Fatigue.   Fever.   Loss of appetite.   Pain in your forehead, behind your eyes, and over your cheekbones (sinus pain).  Muscle aches.  DIAGNOSIS  Your health care provider may diagnose a URI by:  Physical exam.  Tests to check that your symptoms are not due to another condition such as:  Strep throat.  Sinusitis.  Pneumonia.  Asthma. TREATMENT  A URI goes away on its own with time. It cannot be cured with medicines, but medicines may be prescribed or recommended to relieve symptoms. Medicines may help:  Reduce your fever.  Reduce your cough.  Relieve nasal congestion. HOME CARE INSTRUCTIONS   Take medicines only as directed by your health care provider.   Gargle warm saltwater or take cough drops to comfort your throat as directed by your health care provider.  Use a warm mist humidifier or inhale steam from a shower to increase air moisture. This may make it easier to breathe.  Drink enough fluid to keep your urine clear or pale yellow.   Eat soups and other clear broths and maintain good nutrition.   Rest as needed.   Return to work when your temperature has returned to normal or as your health care provider advises. You may need to stay home longer to avoid infecting others. You can also use a face mask and careful hand washing to prevent spread  of the virus.  Increase the usage of your inhaler if you have asthma.   Do not use any tobacco products, including cigarettes, chewing tobacco, or electronic cigarettes. If you need help quitting, ask your health care provider. PREVENTION  The best way to protect yourself from getting a cold is to practice good hygiene.   Avoid oral or hand contact with people with cold symptoms.   Wash your hands often if contact occurs.  There is no clear evidence that vitamin C, vitamin E, echinacea, or exercise  reduces the chance of developing a cold. However, it is always recommended to get plenty of rest, exercise, and practice good nutrition.  SEEK MEDICAL CARE IF:   You are getting worse rather than better.   Your symptoms are not controlled by medicine.   You have chills.  You have worsening shortness of breath.  You have brown or red mucus.  You have yellow or brown nasal discharge.  You have pain in your face, especially when you bend forward.  You have a fever.  You have swollen neck glands.  You have pain while swallowing.  You have white areas in the back of your throat. SEEK IMMEDIATE MEDICAL CARE IF:   You have severe or persistent:  Headache.  Ear pain.  Sinus pain.  Chest pain.  You have chronic lung disease and any of the following:  Wheezing.  Prolonged cough.  Coughing up blood.  A change in your usual mucus.  You have a stiff neck.  You have changes in your:  Vision.  Hearing.  Thinking.  Mood. MAKE SURE YOU:   Understand these instructions.  Will watch your condition.  Will get help right away if you are not doing well or get worse.   This information is not intended to replace advice given to you by your health care provider. Make sure you discuss any questions you have with your health care provider.   Document Released: 05/29/2001 Document Revised: 04/19/2015 Document Reviewed: 03/10/2014 Elsevier Interactive Patient Education [employer].

## 2016-01-03 ENCOUNTER — Other Ambulatory Visit: Payer: Self-pay | Admitting: Medical

## 2016-01-04 LAB — HM MAMMOGRAPHY

## 2016-01-05 ENCOUNTER — Encounter: Payer: Self-pay | Admitting: Medical

## 2016-01-05 ENCOUNTER — Other Ambulatory Visit: Payer: Self-pay | Admitting: Medical

## 2016-01-06 ENCOUNTER — Telehealth: Payer: Self-pay | Admitting: Medical

## 2016-01-06 NOTE — Telephone Encounter (Signed)
Pt called back and I let her know

## 2016-01-06 NOTE — Telephone Encounter (Signed)
Mammogram normal/no worrisome findings. 

## 2016-01-06 NOTE — Telephone Encounter (Signed)
LMTCB

## 2016-01-10 ENCOUNTER — Telehealth: Payer: Self-pay

## 2016-01-10 NOTE — Telephone Encounter (Signed)
Pt called saying she is unsure of her Lisinopril-hctz prescription dosing. She says she came for a physical back in June and you changed her dose from 10-12.5mg  to 10mg . She wants to know which one she is supposed to be taking since the pharmacy gave her both.

## 2016-01-10 NOTE — Telephone Encounter (Signed)
It should be Lisinopril HCT 10/12.5mg  daily

## 2016-01-11 NOTE — Telephone Encounter (Signed)
LMTCB

## 2016-01-11 NOTE — Telephone Encounter (Signed)
Pt stated her bp was 62/12 Monday. She is confused. Going to get her to come in for a appt

## 2016-01-12 ENCOUNTER — Ambulatory Visit (INDEPENDENT_AMBULATORY_CARE_PROVIDER_SITE_OTHER): Payer: 59 | Admitting: Medical

## 2016-01-12 ENCOUNTER — Encounter: Payer: Self-pay | Admitting: Medical

## 2016-01-12 ENCOUNTER — Telehealth: Payer: Self-pay | Admitting: Medical

## 2016-01-12 VITALS — BP 118/72 | HR 64 | Wt 160.6 lb

## 2016-01-12 DIAGNOSIS — I10 Essential (primary) hypertension: Secondary | ICD-10-CM | POA: Diagnosis not present

## 2016-01-12 DIAGNOSIS — I340 Nonrheumatic mitral (valve) insufficiency: Secondary | ICD-10-CM | POA: Diagnosis not present

## 2016-01-12 DIAGNOSIS — N289 Disorder of kidney and ureter, unspecified: Secondary | ICD-10-CM

## 2016-01-12 DIAGNOSIS — M810 Age-related osteoporosis without current pathological fracture: Secondary | ICD-10-CM | POA: Diagnosis not present

## 2016-01-12 DIAGNOSIS — I071 Rheumatic tricuspid insufficiency: Secondary | ICD-10-CM | POA: Diagnosis not present

## 2016-01-12 MED ORDER — LISINOPRIL 10 MG PO TABS: 10.0000 mg | ORAL_TABLET | Freq: Every day | ORAL | Status: DC

## 2016-01-12 MED ORDER — ALENDRONATE SODIUM 70 MG PO TABS: 70.0000 mg | ORAL_TABLET | ORAL | Status: DC

## 2016-01-12 NOTE — Progress Notes (Signed)
Subjective: Chief Complaint  Patient presents with  . consult    consult bp meds.    Here to discuss BP medications.  She is currently taking Atenolol 50mg  daily and Lisinopril HCT 10/12.5mg  daily.  She is confused.  At some point in the last year she had been changed to Lisinopril 10mg  without HCT,but refills have been for Lisinopril HCT.   Not sure what to do.  BPs usually in the 110s/70, but sometimes 90/60s.   Sometimes feels drained/weak, attributes this to the Lisinopril HCT.   No other aggravating or relieving factors. No other complaint.  Past Medical History  Diagnosis Date  . Anxiety   . Allergy   . Dyslipidemia   . Hypertension   . Lactose intolerance   . Menopausal problem   . 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 Rollene Fare  . GERD (gastroesophageal reflux disease)     hx/o, resolved  . Cataract     hx/o surgery, both eyes; Dr. Gershon Crane  . Diverticulosis     per 2010 colonoscopy  . History of cardiovascular stress test 06/22/2010    normal bruce myocadial perfusion study, 72% EF; Dr. Gwenlyn Found  . H/O echocardiogram 06/22/2010    mild aortic valve stenosis, left ventricular normal, EF 55%; Dr. Gwenlyn Found  . History of mammogram     benign bilat calcifications, heterogeneously dense breasts, stable mammograms 2009-2014  . H/O bone density study 05/10/2006    normal bone density  . 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 Marti Sleigh gyencology oncology [place]; Dr. Lorriane Shire gynecology  . SVT (supraventricular tachycardia) ([place])     asymptomatic 2014, hx/o SVT, Dr. Rollene Fare  . History of pneumococcal vaccination   . Glaucoma 2016  . Full dentures   . Atrophic vaginitis    ROS as in subjective   Objective: BP 118/72 mmHg  Pulse 64  Wt 160 lb 9.6  oz (72.848 kg)  General appearance: alert, no distress, WD/WN Neck: supple, no lymphadenopathy, no thyromegaly, no masses Heart: RRR, normal S1, S2, no murmurs Lungs: CTA bilaterally, no wheezes, rhonchi, or rales Ext: no edema Pulses: 2+ symmetric, upper and lower extremities, normal cap refill    Assessment: Encounter Diagnoses  Name Primary?  . Essential hypertension, benign Yes  . Renal insufficiency   . Moderate mitral regurgitation   . Moderate tricuspid regurgitation   . Osteoporosis     Plan: After reviewing chart, I changed her from Lisinopril HCT 10/12.5 mg to Lisinopril 10mg  in 10/2014 after her BPs were running low and she was feeling weak in general.  Shortly after that visit, her pharmacy sent refill on the Lisinopril HCT despite the aforementioned Lisinopril plain prescription being just filled.  So this created confusion apparently and got filled anyway.  She saw cardiology in march 2016 at which time the chart shows she was still taking Lisinopril HCT.   She has been on the Lisinopril HCT since but still gets some low readings relatively frequently.     At this point, change back to Lisinopril 10mg  plain, will call pharmacy to D/C lisinopril HCT as well.  She will c/t to monitor her BP regularly.  discussed goal BPs.   Return in May for physical, call report on BP in 35mo.  Also of  note, she never got a call back on her bone density scan in 06/2015.  It was signed off by another provider here and no call back apparently was given.   She actually was found to have osteoporosis.   At this time we discussed diagnosis, possible complications, risk factors and treatment. C/t Vit D, calcium exercise, and add Fosamax weekly. discussed risks/benefits of medication.

## 2016-01-12 NOTE — Telephone Encounter (Signed)
Olivia at [facility] states she canceled the Lisinopril HCT and kept regular Lisinopril 10mg  active.

## 2016-01-12 NOTE — Telephone Encounter (Signed)
please call her pharmacy and tell them to discontinue Lisinopril HCT for now.   To avoid any confusion, she should be on Lisinopril 10mg  and NOT lisinopril HCT at this time.

## 2016-06-25 ENCOUNTER — Other Ambulatory Visit: Payer: Self-pay | Admitting: Orthopedic Surgery

## 2016-06-26 ENCOUNTER — Ambulatory Visit (INDEPENDENT_AMBULATORY_CARE_PROVIDER_SITE_OTHER): Payer: Medicare Other | Admitting: Medical

## 2016-06-26 VITALS — BP 112/72 | HR 58 | Ht 62.25 in | Wt 160.0 lb

## 2016-06-26 DIAGNOSIS — I340 Nonrheumatic mitral (valve) insufficiency: Secondary | ICD-10-CM

## 2016-06-26 DIAGNOSIS — M810 Age-related osteoporosis without current pathological fracture: Secondary | ICD-10-CM | POA: Diagnosis not present

## 2016-06-26 DIAGNOSIS — J309 Allergic rhinitis, unspecified: Secondary | ICD-10-CM

## 2016-06-26 DIAGNOSIS — I081 Rheumatic disorders of both mitral and tricuspid valves: Secondary | ICD-10-CM

## 2016-06-26 DIAGNOSIS — I071 Rheumatic tricuspid insufficiency: Secondary | ICD-10-CM

## 2016-06-26 DIAGNOSIS — E559 Vitamin D deficiency, unspecified: Secondary | ICD-10-CM

## 2016-06-26 DIAGNOSIS — H9193 Unspecified hearing loss, bilateral: Secondary | ICD-10-CM

## 2016-06-26 DIAGNOSIS — Z0000 Encounter for general adult medical examination without abnormal findings: Secondary | ICD-10-CM | POA: Insufficient documentation

## 2016-06-26 DIAGNOSIS — N289 Disorder of kidney and ureter, unspecified: Secondary | ICD-10-CM | POA: Diagnosis not present

## 2016-06-26 DIAGNOSIS — I10 Essential (primary) hypertension: Secondary | ICD-10-CM

## 2016-06-26 DIAGNOSIS — I471 Supraventricular tachycardia: Secondary | ICD-10-CM

## 2016-06-26 DIAGNOSIS — E785 Hyperlipidemia, unspecified: Secondary | ICD-10-CM | POA: Diagnosis not present

## 2016-06-26 DIAGNOSIS — I456 Pre-excitation syndrome: Secondary | ICD-10-CM

## 2016-06-26 LAB — LIPID PANEL
CHOL/HDL RATIO: 3.5 ratio (ref <=5.0)
Cholesterol: 146 mg/dL (ref 125–200)
HDL: 42 mg/dL — ABNORMAL LOW (ref >=46)
LDL Cholesterol: 74 mg/dL (ref <130)
Triglycerides: 149 mg/dL (ref <150)
VLDL: 30 mg/dL (ref <30)

## 2016-06-26 LAB — POCT URINALYSIS DIPSTICK
Bilirubin, UA: NEGATIVE
Glucose, UA: NEGATIVE
KETONES UA: NEGATIVE
Nitrite, UA: NEGATIVE
PH UA: 6.5
Protein, UA: NEGATIVE
RBC UA: NEGATIVE
Spec Grav, UA: 1.02
Urobilinogen, UA: NEGATIVE

## 2016-06-26 LAB — COMPREHENSIVE METABOLIC PANEL
ALK PHOS: 57 U/L (ref 33–130)
ALT: 17 U/L (ref 6–29)
AST: 28 U/L (ref 10–35)
Albumin: 4 g/dL (ref 3.6–5.1)
BUN: 18 mg/dL (ref 7–25)
CO2: 26 mmol/L (ref 20–31)
Calcium: 9.4 mg/dL (ref 8.6–10.4)
Chloride: 104 mmol/L (ref 98–110)
Creat: 1.08 mg/dL — ABNORMAL HIGH (ref 0.60–0.88)
GLUCOSE: 84 mg/dL (ref 65–99)
POTASSIUM: 5.2 mmol/L (ref 3.5–5.3)
Sodium: 141 mmol/L (ref 135–146)
Total Bilirubin: 0.8 mg/dL (ref 0.2–1.2)
Total Protein: 7.7 g/dL (ref 6.1–8.1)

## 2016-06-26 LAB — CBC
HEMATOCRIT: 41.1 % (ref 35.0–45.0)
Hemoglobin: 13.6 g/dL (ref 11.7–15.5)
MCH: 29 pg (ref 27.0–33.0)
MCHC: 33.1 g/dL (ref 32.0–36.0)
MCV: 87.6 fL (ref 80.0–100.0)
MPV: 10.7 fL (ref 7.5–12.5)
Platelets: 234 10*3/uL (ref 140–400)
RBC: 4.69 MIL/uL (ref 3.80–5.10)
RDW: 14.4 % (ref 11.0–15.0)
WBC: 3.4 10*3/uL — AB (ref 4.0–10.5)

## 2016-06-26 MED ORDER — SIMVASTATIN 40 MG PO TABS: 40.0000 mg | ORAL_TABLET | Freq: Every day | ORAL | Status: DC

## 2016-06-26 MED ORDER — ASPIRIN 81 MG PO TABS: 81.0000 mg | ORAL_TABLET | Freq: Every day | ORAL | Status: DC

## 2016-06-26 MED ORDER — LISINOPRIL 10 MG PO TABS: 10.0000 mg | ORAL_TABLET | Freq: Every day | ORAL | Status: DC

## 2016-06-26 MED ORDER — ALENDRONATE SODIUM 70 MG PO TABS: 70.0000 mg | ORAL_TABLET | ORAL | Status: DC

## 2016-06-26 MED ORDER — ATENOLOL 50 MG PO TABS: 50.0000 mg | ORAL_TABLET | Freq: Every day | ORAL | Status: DC

## 2016-06-26 NOTE — Progress Notes (Signed)
Subjective:    [name] is a 82 y.o. female who presents for Preventative Services visit and chronic medical problems/med check visit.    Primary Care Provider Crisoforo Oxford, PA-C here for primary care  Current Health Care Team:  Dentist, Dr. Sanjuana Kava doctor, Dr. Gershon Crane  [name], cardiology  Dr. Benson Norway, GI Glade Lloyd, Kymberly Blomberg [facility], [place] you may have received from other than [facility] providers in the past year (date may be approximate) no  Exercise Current exercise habits: Home exercise routine includes walking 2 hrs per day.   Nutrition/Diet Current diet: in general, a "healthy" diet    Depression Screen Depression screen PHQ 2/9 06/26/2016  Decreased Interest 0  Down, Depressed, Hopeless 0  PHQ - 2 Score 0    Activities of Daily Living Screen/Functional Status Survey Is the patient deaf or have difficulty hearing?: No Does the patient have difficulty seeing, even when wearing glasses/contacts?: No Does the patient have difficulty concentrating, remembering, or making decisions?: No Does the patient have difficulty walking or climbing stairs?: No Does the patient have difficulty dressing or bathing?: No Does the patient have difficulty doing errands alone such as visiting a doctor's office or shopping?: No  Can patient draw a clock face showing 3:15 o'clock, no  Fall Risk Screen Fall Risk  06/26/2016 06/14/2015  Falls in the past year? No No    Gait Assessment: Normal gait observed yes  Advanced directives Does patient have a [place]? Yes Does patient have a Living Will? Yes  Past Medical History  Diagnosis Date  . Anxiety   . Allergy   . Dyslipidemia   . Hypertension   . Lactose intolerance   . Menopausal problem   . 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 Rollene Fare  . GERD (gastroesophageal reflux disease)     hx/o, resolved  . Cataract     hx/o surgery, both eyes; Dr. Gershon Crane  . Diverticulosis     per 2010 colonoscopy  . History of cardiovascular stress test 06/22/2010    normal bruce myocadial perfusion study, 72% EF; Dr. Gwenlyn Found  . H/O echocardiogram 06/22/2010    mild aortic valve stenosis, left ventricular normal, EF 55%; Dr. Gwenlyn Found  . History of mammogram     benign bilat calcifications, heterogeneously dense breasts, stable mammograms 2009-2014  . H/O bone density study 05/10/2006    normal bone density  . 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 Marti Sleigh gyencology oncology [place]; Dr. Lorriane Shire gynecology  . SVT (supraventricular tachycardia) ([place])     asymptomatic 2014, hx/o SVT, Dr. Rollene Fare  . History of pneumococcal vaccination   . Glaucoma 2016  . Full dentures   . Atrophic vaginitis     Past Surgical History  Procedure Laterality Date  . Colonoscopy  10/06/2009    scattered diverticla, medium hemorrhoids; Dr. Benson Norway  . Total hip arthroplasty Bilateral 2008, 2010     twice left (complication on the left, once on the right; Dr. Alphonzo Severance  . Cataract extraction      bilat  . Abdominal hysterectomy Bilateral     h/o uterine cancer; oophrectomy  . Transthoracic echocardiogram  02/2013    ordered for SVT; EF 55-65%; calcified MV annulus,  mod MR; RSVP increased; RA mildly dilated; mod TR; PA peak pressure 67mmHg  . Nm myocar perf wall motion      Social History   Social History  . Marital Status: Married    Spouse Name: N/A  . Number of Children: 1  . Years of Education: N/A   Occupational History  . Not on file.   Social History Main Topics  . Smoking status: Never Smoker   . Smokeless tobacco: Never Used  . Alcohol Use: No  . Drug Use: No  . Sexual Activity: No     Comment: walks 3x/wk, retired, married, 2 grandchildren    Other Topics Concern  . Not on file   Social History Narrative   Married, 1 son, 2 grandchildren, ages 43yo and 21yo; exercise 4 days per week - walking, mall and [employer].  Husband has hx/o CVA, but is ambulatory.    Family History  Problem Relation Age of Onset  . Family history unknown: Yes     Current outpatient prescriptions:  .  alendronate (FOSAMAX) 70 MG tablet, Take 1 tablet (70 mg total) by mouth every 7 (seven) days. Take with a full glass of water on an empty stomach., Disp: 4 tablet, Rfl: 11 .  aspirin 81 MG tablet, Take 1 tablet (81 mg total) by mouth daily., Disp: 90 tablet, Rfl: 3 .  atenolol (TENORMIN) 50 MG tablet, Take 1 tablet (50 mg total) by mouth daily., Disp: 90 tablet, Rfl: 3 .  latanoprost (XALATAN) 0.005 % ophthalmic solution, 1 drop at bedtime., Disp: , Rfl:  .  lisinopril (PRINIVIL,ZESTRIL) 10 MG tablet, Take 1 tablet (10 mg total) by mouth daily., Disp: 90 tablet, Rfl: 1 .  simvastatin (ZOCOR) 40 MG tablet, Take 1 tablet (40 mg total) by mouth at bedtime., Disp: 90 tablet, Rfl: 3  No Known Allergies  History reviewed: allergies, current medications, past family history, past medical history, past social history, past surgical history and problem list  Chronic issues discussed: Compliant with medications for BP, cholesterol, Vit D, aspirin daily.  Compliant with fosamax although gives her some GI upset.     Acute issues discussed: none  Objective:     Biometrics BP 112/72 mmHg  Pulse 58  Ht 5' 2.25" (1.581 m)  Wt 160 lb (72.576 kg)  BMI 29.04 kg/m2   Cognitive Testing  Alert? Yes  Normal Appearance?Yes  Oriented to person? Yes  Place? Yes   Time? Yes  Recall of three objects?  Yes  Can perform simple calculations? Yes  Displays appropriate judgment?Yes  Can read the correct time from a watch face?Yes  General appearance: alert, no distress, WD/WN, AA  female  Nutritional Status: Inadequate calore intake? no Loss of muscle mass?  no Loss of fat beneath skin? no Localized or general edema? no Diminished functional status? no  Other pertinent exam:  General appearance: alert, no distress, WD/WN, pleasant AA female Skin: left and right dorsal forearm scar from prior laceration, no worrisome lesions HEENT: normocephalic, conjunctiva/corneas normal, sclerae anicteric, PERRLA, EOMi, nares patent, no discharge or erythema, pharynx normal  Oral cavity: MMM, tongue normal, teeth - dentures upper and lower  Neck: supple, no lymphadenopathy, no thyromegaly, no masses, normal ROM, no bruits  Chest: non tender, normal shape and expansion  Heart: RRR, normal S1, S2, no murmurs  Lungs: CTA bilaterally, no wheezes, rhonchi, or rales  Abdomen: +bs, soft, vertical lower abdominal surgical scar, non tender, non distended, no masses, no hepatomegaly, no splenomegaly, no bruits  Back: non tender, normal ROM, no scoliosis  Musculoskeletal: mildly reduced left hip internal ROM, otherwise upper extremities non tender, no obvious deformity, normal ROM throughout, lower extremities non tender, no obvious deformity, normal ROM throughout  Extremities: +mild bilat lower leg varicosities, no edema, no cyanosis, no clubbing  Pulses: 2+ symmetric, upper and lower extremities, normal cap refill  Neurological: alert, oriented x 3, CN2-12 intact, strength normal upper extremities and lower extremities, sensation normal throughout, DTRs 2+ throughout, no cerebellar signs, gait normal  Psychiatric: normal affect, behavior normal, pleasant  Breast/pelvic/rectal - deferred today, reviewed 2016 visit notes and exam findings   Assessment:   Encounter Diagnoses  Name Primary?  . Medicare annual wellness visit, subsequent Yes  . Dyslipidemia   . Essential hypertension, benign   . Osteoporosis   . Vitamin D deficiency   . Renal insufficiency   . Allergic rhinitis, unspecified allergic rhinitis type   . Moderate tricuspid regurgitation    . Moderate mitral regurgitation   . Lown Jerilynn Birkenhead syndrome     Plan:   A preventative services visit was completed today.  During the course of the visit today, we discussed and counseled about appropriate screening and preventive services.  A health risk assessment was established today that included a review of current medications, allergies, social history, family history, medical and preventative health history, biometrics, and preventative screenings to identify potential safety concerns or impairments.  A personalized plan was printed today for your records and use.   Personalized health advice and education was given today to reduce health risks and promote self management and wellness.  Information regarding end of life planning was discussed today.  Conditions/risks identified: None in particular discussed fall prevention, diet, exercise, staying active mentally and physical.    She is a healthy 83yo, better health than average.   Chronic problems discussed today: C/t current medications for BP, cholesterol, Aspirin daily.  Labs today.  Osteoporosis on bone density 2016.  Discussed fosamax and gave other options as alternate treatments. She wants to stick with fosamax for now.   Acute problems discussed today: none  Recommendations:  I recommend a yearly ophthalmology/optometry visit for glaucoma screening and eye checkup  I recommended a yearly dental visit for hygiene and checkup  Advanced directives - discussed nature and purpose of Advanced Directives, encouraged them to complete them if they have not done so and/or encouraged them to get [place] a copy if they have done this already.  Reviewed mammogram from 02/2016  She will see Dr. Benson Norway in September for updated colonoscopy.   Has this lined up  [facility] cardiology [name] again this year for echocardiogram soon.   Referrals today: none  Immunizations: I recommended a yearly influenza vaccine, typically in  September when the vaccine is usually available Is the Pneumococcal vaccine up to date: yes. Is the Shingles vaccine up to date: no.  Declines due to cost Is the Td/Tdap vaccine up to date: yes.  Hayzlee was seen today for annual exam.  Diagnoses and all orders for this visit:  Medicare annual wellness visit, subsequent  Dyslipidemia -     Lipid panel -     VITAMIN D 25 Hydroxy (Vit-D Deficiency, Fractures)  Essential hypertension, benign -     POCT urinalysis dipstick -     Comprehensive metabolic panel -     Lipid panel -     CBC -     VITAMIN D 25 Hydroxy (Vit-D Deficiency, Fractures)  Osteoporosis -  VITAMIN D 25 Hydroxy (Vit-D Deficiency, Fractures)  Vitamin D deficiency -     VITAMIN D 25 Hydroxy (Vit-D Deficiency, Fractures)  Renal insufficiency -     POCT urinalysis dipstick -     VITAMIN D 25 Hydroxy (Vit-D Deficiency, Fractures)  Allergic rhinitis, unspecified allergic rhinitis type  Moderate tricuspid regurgitation  Moderate mitral regurgitation  Lown Jerilynn Birkenhead syndrome  Other orders -     simvastatin (ZOCOR) 40 MG tablet; Take 1 tablet (40 mg total) by mouth at bedtime. -     lisinopril (PRINIVIL,ZESTRIL) 10 MG tablet; Take 1 tablet (10 mg total) by mouth daily. -     atenolol (TENORMIN) 50 MG tablet; Take 1 tablet (50 mg total) by mouth daily. -     aspirin 81 MG tablet; Take 1 tablet (81 mg total) by mouth daily. -     alendronate (FOSAMAX) 70 MG tablet; Take 1 tablet (70 mg total) by mouth every 7 (seven) days. Take with a full glass of water on an empty stomach.   Medicare Attestation A preventative services visit was completed today.  During the course of the visit the patient was educated and counseled about appropriate screening and preventive services.  A health risk assessment was established with the patient that included a review of current medications, allergies, social history, family history, medical and preventative health history,  biometrics, and preventative screenings to identify potential safety concerns or impairments.  A personalized plan was printed today for the patient's records and use.   Personalized health advice and education was given today to reduce health risks and promote self management and wellness.  Information regarding end of life planning was discussed today.  Crisoforo Oxford, PA-C   06/26/2016

## 2016-06-27 ENCOUNTER — Other Ambulatory Visit: Payer: Self-pay | Admitting: Orthopedic Surgery

## 2016-06-27 DIAGNOSIS — M25551 Pain in right hip: Secondary | ICD-10-CM

## 2016-06-27 DIAGNOSIS — H9190 Unspecified hearing loss, unspecified ear: Secondary | ICD-10-CM | POA: Insufficient documentation

## 2016-06-27 LAB — VITAMIN D 25 HYDROXY (VIT D DEFICIENCY, FRACTURES): Vit D, 25-Hydroxy: 60 ng/mL (ref 30–100)

## 2016-07-09 ENCOUNTER — Telehealth: Payer: Self-pay | Admitting: Internal Medicine

## 2016-07-09 DIAGNOSIS — I071 Rheumatic tricuspid insufficiency: Secondary | ICD-10-CM

## 2016-07-09 DIAGNOSIS — I340 Nonrheumatic mitral (valve) insufficiency: Secondary | ICD-10-CM

## 2016-07-09 NOTE — Telephone Encounter (Signed)
New message         The pt is calling to get a yearly ECHO done there is no order in the computer

## 2016-07-09 NOTE — Telephone Encounter (Signed)
Last visit 02/2015 Per note: She will be due for repeat echocardiogram next year.  Last echo in 2014   OK to order - for mitral/tricuspid regurgitation?

## 2016-07-10 NOTE — Telephone Encounter (Signed)
Echo ordered.  Message sent to [facility] to assist in arranging echo appointment Message sent to [facility] admin pool to assist in arranging MD follow up

## 2016-07-10 NOTE — Telephone Encounter (Signed)
Yes .. Ok to order with those indications. Follow-up with me afterwards.  Dr. Lemmie Evens

## 2016-07-19 ENCOUNTER — Ambulatory Visit
Admission: RE | Admit: 2016-07-19 | Discharge: 2016-07-19 | Disposition: A | Discharge status: Home / Self Care | Payer: Medicare Other | Source: Ambulatory Visit | Attending: Orthopedic Surgery | Admitting: Orthopedic Surgery

## 2016-07-19 ENCOUNTER — Encounter: Payer: Self-pay | Admitting: Radiology

## 2016-07-19 DIAGNOSIS — M25551 Pain in right hip: Secondary | ICD-10-CM

## 2016-07-19 MED ORDER — GADOBENATE DIMEGLUMINE 529 MG/ML IV SOLN
15.0000 mL | Freq: Once | INTRAVENOUS | Status: AC | PRN
Start: 2016-07-19 — End: 2016-07-19
  Administered 2016-07-19: 15 mL via INTRAVENOUS

## 2016-07-20 ENCOUNTER — Ambulatory Visit (HOSPITAL_COMMUNITY): Payer: Medicare Other

## 2016-07-31 ENCOUNTER — Ambulatory Visit (HOSPITAL_COMMUNITY): Payer: Medicare Other | Attending: Cardiovascular Disease

## 2016-07-31 ENCOUNTER — Encounter (INDEPENDENT_AMBULATORY_CARE_PROVIDER_SITE_OTHER): Payer: Self-pay

## 2016-07-31 ENCOUNTER — Other Ambulatory Visit: Payer: Self-pay

## 2016-07-31 DIAGNOSIS — I059 Rheumatic mitral valve disease, unspecified: Secondary | ICD-10-CM | POA: Diagnosis present

## 2016-07-31 DIAGNOSIS — I340 Nonrheumatic mitral (valve) insufficiency: Secondary | ICD-10-CM | POA: Diagnosis not present

## 2016-07-31 DIAGNOSIS — E785 Hyperlipidemia, unspecified: Secondary | ICD-10-CM | POA: Insufficient documentation

## 2016-07-31 DIAGNOSIS — I119 Hypertensive heart disease without heart failure: Secondary | ICD-10-CM | POA: Insufficient documentation

## 2016-07-31 DIAGNOSIS — I071 Rheumatic tricuspid insufficiency: Secondary | ICD-10-CM | POA: Diagnosis not present

## 2016-07-31 DIAGNOSIS — I272 Other secondary pulmonary hypertension: Secondary | ICD-10-CM | POA: Insufficient documentation

## 2016-08-09 ENCOUNTER — Encounter: Payer: Self-pay | Admitting: Internal Medicine

## 2016-08-09 ENCOUNTER — Ambulatory Visit (INDEPENDENT_AMBULATORY_CARE_PROVIDER_SITE_OTHER): Payer: Medicare Other | Admitting: Internal Medicine

## 2016-08-09 ENCOUNTER — Encounter (INDEPENDENT_AMBULATORY_CARE_PROVIDER_SITE_OTHER): Payer: Self-pay

## 2016-08-09 VITALS — BP 128/82 | HR 70 | Ht 62.0 in | Wt 161.2 lb

## 2016-08-09 DIAGNOSIS — I340 Nonrheumatic mitral (valve) insufficiency: Secondary | ICD-10-CM

## 2016-08-09 DIAGNOSIS — I471 Supraventricular tachycardia: Secondary | ICD-10-CM

## 2016-08-09 DIAGNOSIS — I456 Pre-excitation syndrome: Secondary | ICD-10-CM

## 2016-08-09 DIAGNOSIS — I10 Essential (primary) hypertension: Secondary | ICD-10-CM

## 2016-08-09 DIAGNOSIS — E785 Hyperlipidemia, unspecified: Secondary | ICD-10-CM

## 2016-08-09 DIAGNOSIS — I071 Rheumatic tricuspid insufficiency: Secondary | ICD-10-CM

## 2016-08-09 NOTE — Patient Instructions (Signed)
Your physician wants you to follow-up in: 1 year with [name]. You will receive a reminder letter in the mail two months in advance. If you don't receive a letter, please call our office to schedule the follow-up appointment.   If you need a refill on your cardiac medications before your next appointment, please call your pharmacy.

## 2016-08-09 NOTE — Progress Notes (Signed)
OFFICE NOTE  Chief Complaint:  No complaints  Primary Care Physician: Crisoforo Oxford, PA-C  HPI:  [name] is a pleasant 82 year old female previously followed by Dr. Rollene Fare. She's here today to establish cardiac care with me. She has history of hypertension, remote bilateral hip placements and redo hip surgery for failed prosthesis in 2011. 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 She was previously on flecainide but has been taken off of that. She's had no recurrence of her palpitations on beta blockers. She had a negative Myoview for ischemia in 2011 an echocardiogram in 2014 which showed normal systolic function and moderate mitral regurgitation.  She has no complaints today.  The pleasure see [name] back in the office today. She is without any complaints. She continues to be active and denies any worsening shortness of breath or chest pain. She's had no further recurrent palpitations. Heart rate seems well controlled on atenolol. Recently she's had some issues with lower blood pressure and her lisinopril HCTZ was changed to lisinopril. This is associated with a small increase in blood pressure to the normal range. She now feels well again. Should be noted she had an echocardiogram in 2014 which showed moderate MR and moderate TR.  08/09/2016  [name] returns today for follow-up. She continues to be without complaints. She walks regularly and has no shortness of breath or worsening chest pain. Blood pressure is well-controlled today. She just had a physical which was unremarkable. Cholesterol has been well controlled -recent total cholesterol 146, HDL 42, LDL 74 and triglycerides 149. She just had a repeat echocardiogram which shows persistent moderate MR and TR with an EF of 65%. This appears to be unchanged compared to her prior echo in 2014. There is very mild pulmonary hypertension but she is asymptomatic with  this.  PMHx:  Past Medical History:  Diagnosis Date  . Allergy   . Anxiety   . Atrophic vaginitis   . Cataract    hx/o surgery, both eyes; Dr. Gershon Crane  . Diverticulosis    per 2010 colonoscopy  . Dyslipidemia   . Full dentures   . GERD (gastroesophageal reflux disease)    hx/o, resolved  . Glaucoma 2016  . H/O bone density study 05/10/2006   normal bone density  . H/O echocardiogram 06/22/2010   mild aortic valve stenosis, left ventricular normal, EF 55%; Dr. Gwenlyn Found  . History of cardiovascular stress test 06/22/2010   normal bruce myocadial perfusion study, 72% EF; Dr. Gwenlyn Found  . History of mammogram    benign bilat calcifications, heterogeneously dense breasts, stable mammograms 2009-2014  . History of pneumococcal vaccination   . 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 Marti Sleigh gyencology oncology [place]; Dr. Lorriane Shire gynecology  . Hypertension   . Lactose intolerance   . 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 Rollene Fare  . Menopausal problem   . SVT (supraventricular tachycardia) ([place])    asymptomatic 2014, hx/o SVT, Dr. Rollene Fare    Past Surgical History:  Procedure Laterality Date  . ABDOMINAL HYSTERECTOMY Bilateral    h/o uterine cancer; oophrectomy  . CATARACT EXTRACTION     bilat  . COLONOSCOPY  10/06/2009   scattered diverticla, medium hemorrhoids;  Dr. Benson Norway  . NM MYOCAR PERF WALL MOTION    . TOTAL HIP ARTHROPLASTY Bilateral 2008, 2010    twice left (complication on the left, once on the right; Dr. Alphonzo Severance  . TRANSTHORACIC ECHOCARDIOGRAM  02/2013   ordered for SVT; EF 55-65%; calcified MV annulus, mod MR; RSVP increased; RA mildly dilated; mod TR; PA peak pressure 41mmHg    FAMHx:  Family History  Problem Relation Age of Onset  .  Family history unknown: Yes    SOCHx:   reports that she has never smoked. She has never used smokeless tobacco. She reports that she does not drink alcohol or use drugs.  ALLERGIES:  No Known Allergies  ROS: Pertinent items noted in HPI and remainder of comprehensive ROS otherwise negative.  HOME MEDS: Current Outpatient Prescriptions  Medication Sig Dispense Refill  . alendronate (FOSAMAX) 70 MG tablet Take 1 tablet (70 mg total) by mouth every 7 (seven) days. Take with a full glass of water on an empty stomach. 4 tablet 11  . aspirin 81 MG tablet Take 1 tablet (81 mg total) by mouth daily. 90 tablet 3  . atenolol (TENORMIN) 50 MG tablet Take 1 tablet (50 mg total) by mouth daily. 90 tablet 3  . latanoprost (XALATAN) 0.005 % ophthalmic solution 1 drop at bedtime.    Marland Kitchen lisinopril (PRINIVIL,ZESTRIL) 10 MG tablet Take 1 tablet (10 mg total) by mouth daily. 90 tablet 3  . simvastatin (ZOCOR) 40 MG tablet Take 1 tablet (40 mg total) by mouth at bedtime. 90 tablet 3   No current facility-administered medications for this visit.     LABS/IMAGING: No results found for this or any previous visit (from the past 48 hour(s)). No results found.  VITALS: BP 128/82 (BP Location: Left Arm, Patient Position: Sitting, Cuff Size: Normal)   Pulse 70   Ht 5\' 2"  (1.575 m)   Wt 161 lb 3.2 oz (73.1 kg)   BMI 29.48 kg/m   EXAM: General appearance: alert and no distress Neck: no carotid bruit, no JVD and thyroid not enlarged, symmetric, no tenderness/mass/nodules Lungs: clear to auscultation bilaterally Heart: regular rate and rhythm, S1, S2 normal, systolic murmur: holosystolic 3/6, blowing at apex and 2nd systolic murmur: early systolic 2/6, crescendo at 2nd right intercostal space Abdomen: soft, non-tender; bowel sounds normal; no masses,  no organomegaly Extremities: extremities normal, atraumatic, no cyanosis or edema Pulses: 2+ and symmetric Skin: Skin color, texture, turgor normal. 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 Hypertension 4. Dyslipidemia 5. Moderate MR and moderate TR (2017), LVEF 65%  PLAN: 1.   [name] is doing well with good control of blood pressure and cholesterol. She's active and exercises regularly. She has not had any symptoms related to her MR and TR which seem to be stable compared to an echo 3 years ago. EF is unchanged. I recommend continuing current medical therapies and will be happy see her back annually or sooner as necessary for follow-up.  Pixie Casino, MD, [facility] Attending Cardiologist [place] C Nai Dasch 08/09/2016, 9:41 AM

## 2016-08-17 HISTORY — PX: COLONOSCOPY: SHX174

## 2016-10-02 ENCOUNTER — Other Ambulatory Visit (INDEPENDENT_AMBULATORY_CARE_PROVIDER_SITE_OTHER): Payer: Medicare Other

## 2016-10-02 DIAGNOSIS — Z23 Encounter for immunization: Secondary | ICD-10-CM

## 2017-01-10 LAB — HM MAMMOGRAPHY

## 2017-01-14 ENCOUNTER — Telehealth: Payer: Self-pay | Admitting: Medical

## 2017-01-14 NOTE — Telephone Encounter (Signed)
Mammogram normal/no worrisome findings. 

## 2017-01-15 ENCOUNTER — Encounter: Payer: Self-pay | Admitting: Medical

## 2017-01-15 NOTE — Telephone Encounter (Signed)
Called andl/m for pt to call us back. 

## 2017-01-16 ENCOUNTER — Encounter: Payer: Self-pay | Admitting: Medical

## 2017-01-16 NOTE — Telephone Encounter (Signed)
notified pt of results

## 2017-04-02 ENCOUNTER — Ambulatory Visit (INDEPENDENT_AMBULATORY_CARE_PROVIDER_SITE_OTHER): Payer: Medicare Other | Admitting: Medical

## 2017-04-02 ENCOUNTER — Encounter: Payer: Self-pay | Admitting: Medical

## 2017-04-02 VITALS — BP 120/70 | HR 54 | Wt 155.6 lb

## 2017-04-02 DIAGNOSIS — J301 Allergic rhinitis due to pollen: Secondary | ICD-10-CM

## 2017-04-02 DIAGNOSIS — R05 Cough: Secondary | ICD-10-CM | POA: Diagnosis not present

## 2017-04-02 DIAGNOSIS — R0789 Other chest pain: Secondary | ICD-10-CM | POA: Diagnosis not present

## 2017-04-02 DIAGNOSIS — R059 Cough, unspecified: Secondary | ICD-10-CM

## 2017-04-02 MED ORDER — CETIRIZINE HCL 10 MG PO TABS: 10.0000 mg | ORAL_TABLET | Freq: Every day | ORAL | 3 refills | Status: DC

## 2017-04-02 NOTE — Progress Notes (Signed)
Subjective:  [name] is a 83 y.o. female who presents for cough, congestion Chief Complaint  Patient presents with  . rib pain    left side under breast pain, had a cold a week ago and wants to make sure she doesnt have an infection   Symptoms started with cold symptoms a week ago, but now mostly has cough, sneezing, some irritated eyes, left chest wall pain from coughing.  Denies chills, dyspnea, ear pain, facial pain, fever, productive cough, sore throat, sweats, weight loss and wheezing.  Using Mucinex for symptoms.  Denies sick contacts.  Patient is not a smoker.  No other aggravating or relieving factors.  No other c/o.  Past Medical History:  Diagnosis Date  . Allergy   . Anxiety   . Atrophic vaginitis   . Cataract    hx/o surgery, both eyes; Dr. Gershon Crane  . Diverticulosis    per 2010 colonoscopy  . Dyslipidemia   . Full dentures   . GERD (gastroesophageal reflux disease)    hx/o, resolved  . Glaucoma 2016  . H/O bone density study 05/10/2006   normal bone density  . H/O echocardiogram 06/22/2010   mild aortic valve stenosis, left ventricular normal, EF 55%; Dr. Gwenlyn Found  . History of cardiovascular stress test 06/22/2010   normal bruce myocadial perfusion study, 72% EF; Dr. Gwenlyn Found  . History of mammogram    benign bilat calcifications, heterogeneously dense breasts, stable mammograms 2009-2014  . History of pneumococcal vaccination   . 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 Marti Sleigh gyencology oncology [place]; Dr. Lorriane Shire gynecology  . Hypertension   . Lactose intolerance   . 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 Rollene Fare  . Menopausal problem   . SVT (supraventricular tachycardia) ([place])    asymptomatic 2014, hx/o SVT, Dr. Rollene Fare    Current Outpatient Prescriptions on File Prior to Visit  Medication Sig Dispense Refill  . alendronate (FOSAMAX) 70 MG tablet Take 1 tablet (70 mg total) by mouth every 7 (seven) days. Take with a full glass of water on an empty stomach. 4 tablet 11  . aspirin 81 MG tablet Take 1 tablet (81 mg total) by mouth daily. 90 tablet 3  . atenolol (TENORMIN) 50 MG tablet Take 1 tablet (50 mg total) by mouth daily. 90 tablet 3  . latanoprost (XALATAN) 0.005 % ophthalmic solution 1 drop at bedtime.    Marland Kitchen lisinopril (PRINIVIL,ZESTRIL) 10 MG tablet Take 1 tablet (10 mg total) by mouth daily. 90 tablet 3  . simvastatin (ZOCOR) 40 MG tablet Take 1 tablet (40 mg total) by mouth at bedtime. 90 tablet 3   No current facility-administered medications on file prior to visit.    ROS as in subjective   Objective: BP 120/70   Pulse (!) 54   Wt 155 lb 9.6 oz (70.6 kg)   BMI 28.46 kg/m   General appearance: alert, no distress, WD/WN HEENT: normocephalic, sclerae anicteric, conjunctiva pink and moist, TMs pearly, nares with turbinated edema, clear discharge no erythema, pharynx normal, tonsils unremarkable Oral cavity: MMM, no lesions Neck: supple, no lymphadenopathy, no thyromegaly, no masses Lungs: CTA bilaterally, no wheezes, rhonchi, or rales No chest wall tenderness, no deformity Pulses: 2+ symmetric        Assessment  Encounter Diagnoses  Name Primary?  . Cough Yes  . Chest wall pain   . Seasonal allergic rhinitis due to pollen       Plan: Discussed symptoms, exam, and seems that she is resolving from URI, but allergens may be causing her current symptoms.  Begin zyrtec, rest, hydrate well.  If worse or not improving within the week, the let me know.      [name] was seen today for rib pain.  Diagnoses and all orders for this visit:  Cough  Chest wall pain  Seasonal allergic rhinitis due to pollen  Other orders -     cetirizine (ZYRTEC) 10 MG tablet; Take 1 tablet (10 mg  total) by mouth at bedtime.

## 2017-05-21 ENCOUNTER — Other Ambulatory Visit: Payer: Self-pay | Admitting: Medical

## 2017-05-21 NOTE — Telephone Encounter (Signed)
Is this okay to refill? 

## 2017-06-25 ENCOUNTER — Ambulatory Visit (INDEPENDENT_AMBULATORY_CARE_PROVIDER_SITE_OTHER): Payer: Medicare Other | Admitting: Family Medicine

## 2017-06-25 ENCOUNTER — Encounter: Payer: Self-pay | Admitting: Family Medicine

## 2017-06-25 VITALS — BP 116/70 | HR 68 | Wt 157.0 lb

## 2017-06-25 DIAGNOSIS — K30 Functional dyspepsia: Secondary | ICD-10-CM

## 2017-06-25 NOTE — Progress Notes (Signed)
   Subjective:    Patient ID: [name], female    DOB: 07/09/1933, 83 y.o.   MRN: [id number]  HPI She complains of a two-week history of bloating and indigestion and slight right upper quadrant discomfort that usually occurs in the morning. She notes a coffee and yogurt make this worse and tea does not have an effect on it. No nausea, vomiting, diarrhea or constipation. She cannot relate this to any particular foods. She does not remember any foods tonight before causing difficulty.  Review of Systems     Objective:   Physical Exam Alert and in no distress. Tympanic membranes and canals are normal. Pharyngeal area is normal. Neck is supple without adenopathy or thyromegaly. Cardiac exam shows a regular sinus rhythm without murmurs or gallops. Lungs are clear to auscultation. Abdominal exam shows no masses or tenderness with normal bowel sounds.        Assessment & Plan:  Indigestion  Patient attention to any foods or liquids included that make a difference in terms of worse or better. Try Prilosec at night. Do this for the next week and let us know

## 2017-06-25 NOTE — Patient Instructions (Signed)
Patient attention to any foods or liquids included that make a difference in terms of worse or better. Try Prilosec at night. Do this for the next week and let us know

## 2017-06-28 ENCOUNTER — Other Ambulatory Visit: Payer: Self-pay | Admitting: Medical

## 2017-07-09 ENCOUNTER — Other Ambulatory Visit: Payer: Self-pay | Admitting: Medical

## 2017-07-11 ENCOUNTER — Ambulatory Visit (INDEPENDENT_AMBULATORY_CARE_PROVIDER_SITE_OTHER): Payer: Medicare Other | Admitting: Medical

## 2017-07-11 ENCOUNTER — Encounter: Payer: Self-pay | Admitting: Medical

## 2017-07-11 VITALS — BP 112/68 | HR 68 | Ht 61.0 in | Wt 153.4 lb

## 2017-07-11 DIAGNOSIS — I8390 Asymptomatic varicose veins of unspecified lower extremity: Secondary | ICD-10-CM

## 2017-07-11 DIAGNOSIS — I081 Rheumatic disorders of both mitral and tricuspid valves: Secondary | ICD-10-CM | POA: Diagnosis not present

## 2017-07-11 DIAGNOSIS — I471 Supraventricular tachycardia: Secondary | ICD-10-CM

## 2017-07-11 DIAGNOSIS — M810 Age-related osteoporosis without current pathological fracture: Secondary | ICD-10-CM | POA: Diagnosis not present

## 2017-07-11 DIAGNOSIS — E785 Hyperlipidemia, unspecified: Secondary | ICD-10-CM | POA: Diagnosis not present

## 2017-07-11 DIAGNOSIS — Z7185 Encounter for immunization safety counseling: Secondary | ICD-10-CM | POA: Insufficient documentation

## 2017-07-11 DIAGNOSIS — I456 Pre-excitation syndrome: Secondary | ICD-10-CM

## 2017-07-11 DIAGNOSIS — H9193 Unspecified hearing loss, bilateral: Secondary | ICD-10-CM

## 2017-07-11 DIAGNOSIS — E559 Vitamin D deficiency, unspecified: Secondary | ICD-10-CM

## 2017-07-11 DIAGNOSIS — Z0000 Encounter for general adult medical examination without abnormal findings: Secondary | ICD-10-CM | POA: Diagnosis not present

## 2017-07-11 DIAGNOSIS — I10 Essential (primary) hypertension: Secondary | ICD-10-CM

## 2017-07-11 DIAGNOSIS — Z23 Encounter for immunization: Secondary | ICD-10-CM

## 2017-07-11 DIAGNOSIS — J309 Allergic rhinitis, unspecified: Secondary | ICD-10-CM | POA: Diagnosis not present

## 2017-07-11 DIAGNOSIS — H409 Unspecified glaucoma: Secondary | ICD-10-CM | POA: Diagnosis not present

## 2017-07-11 DIAGNOSIS — N289 Disorder of kidney and ureter, unspecified: Secondary | ICD-10-CM | POA: Diagnosis not present

## 2017-07-11 DIAGNOSIS — Z7189 Other specified counseling: Secondary | ICD-10-CM

## 2017-07-11 LAB — CBC
HEMATOCRIT: 43.4 % (ref 35.0–45.0)
HEMOGLOBIN: 14.4 g/dL (ref 11.7–15.5)
MCH: 29.7 pg (ref 27.0–33.0)
MCHC: 33.2 g/dL (ref 32.0–36.0)
MCV: 89.5 fL (ref 80.0–100.0)
MPV: 10.5 fL (ref 7.5–12.5)
PLATELETS: 205 10*3/uL (ref 140–400)
RBC: 4.85 MIL/uL (ref 3.80–5.10)
RDW: 14 % (ref 11.0–15.0)
WBC: 3.8 10*3/uL — ABNORMAL LOW (ref 4.0–10.5)

## 2017-07-11 LAB — COMPREHENSIVE METABOLIC PANEL
ALK PHOS: 62 U/L (ref 33–130)
ALT: 18 U/L (ref 6–29)
AST: 25 U/L (ref 10–35)
Albumin: 4.1 g/dL (ref 3.6–5.1)
BUN: 16 mg/dL (ref 7–25)
CALCIUM: 9.5 mg/dL (ref 8.6–10.4)
CHLORIDE: 104 mmol/L (ref 98–110)
CO2: 23 mmol/L (ref 20–31)
Creat: 1.07 mg/dL — ABNORMAL HIGH (ref 0.60–0.88)
GLUCOSE: 80 mg/dL (ref 65–99)
POTASSIUM: 4.7 mmol/L (ref 3.5–5.3)
Sodium: 139 mmol/L (ref 135–146)
Total Bilirubin: 0.9 mg/dL (ref 0.2–1.2)
Total Protein: 7.6 g/dL (ref 6.1–8.1)

## 2017-07-11 LAB — IRON AND TIBC
%SAT: 45 % (ref 11–50)
Iron: 134 ug/dL (ref 45–160)
TIBC: 295 ug/dL (ref 250–450)
UIBC: 161 ug/dL

## 2017-07-11 LAB — LIPID PANEL
CHOL/HDL RATIO: 3.3 ratio (ref <5.0)
CHOLESTEROL: 150 mg/dL (ref <200)
HDL: 45 mg/dL — ABNORMAL LOW (ref >50)
LDL Cholesterol: 79 mg/dL (ref <100)
Triglycerides: 130 mg/dL (ref <150)
VLDL: 26 mg/dL (ref <30)

## 2017-07-11 NOTE — Patient Instructions (Addendum)
Recommendations:  Continue regular exercise including walking and weight bearing exercise given history of osteoporosis  Eat a healthy low fat diet  Schedule your bone density scan  Get a yearly mammogram  See your eye doctor yearly for routine vision care.  See your dentist yearly for routine dental care including hygiene visits twice yearly.  Continue your medications as usual  Follow up with Dr. Hilty/cardiology  Avoid falls  I recommend you get the Shingrix vaccine.  I wrote a prescription for this today.  Come in for yearly flu shot in the fall     Osteoporosis Osteoporosis happens when your bones become thinner and weaker. Weak bones can break (fracture) more easily when you slip or fall. Bones most at risk of breaking are in the hip, wrist, and spine. Follow these instructions at home:  Get enough calcium and vitamin D. These nutrients are good for your bones.  Exercise as told by your doctor.  Do not use any tobacco products. This includes cigarettes, chewing tobacco, and electronic cigarettes. If you need help quitting, ask your doctor.  Limit the amount of alcohol you drink.  Take medicines only as told by your doctor.  Keep all follow-up visits as told by your doctor. This is important.  Take care at home to prevent falls. Some ways to do this are: ? Keep rooms well lit and tidy. ? Put safety rails on your stairs. ? Put a rubber mat in the bathroom and other places that are often wet or slippery. Get help right away if:  You fall.  You hurt yourself. This information is not intended to replace advice given to you by your health care provider. Make sure you discuss any questions you have with your health care provider. Document Released: 02/25/2012 Document Revised: 05/10/2016 Document Reviewed: 05/13/2014 Elsevier Interactive Patient Education  2018 [place], which is also known as herpes zoster, is an infection that  causes a painful skin rash and fluid-filled blisters. Shingles is not related to genital herpes, which is a sexually transmitted infection. Shingles only develops in people who:  Have had chickenpox.  Have received the chickenpox vaccine. (This is rare.)  What are the causes? Shingles is caused by varicella-zoster virus (VZV). This is the same virus that causes chickenpox. After exposure to VZV, the virus stays in the body in an inactive (dormant) state. Shingles develops if the virus reactivates. This can happen many years after the initial exposure to VZV. It is not known what causes this virus to reactivate. What increases the risk? People who have had chickenpox or received the chickenpox vaccine are at risk for shingles. Infection is more common in people who:  Are older than age 57.  Have a weakened defense (immune) system, such as those with HIV, AIDS, or cancer.  Are taking medicines that weaken the immune system, such as transplant medicines.  Are under great stress.  What are the signs or symptoms? Early symptoms of this condition include itching, tingling, and pain in an area on your skin. Pain may be described as burning, stabbing, or throbbing. A few days or weeks after symptoms start, a painful red rash appears, usually on one side of the body in a bandlike or beltlike pattern. The rash eventually turns into fluid-filled blisters that break open, scab over, and dry up in about 2-3 weeks. At any time during the infection, you may also develop:  A fever.  Chills.  A headache.  An upset stomach.  How is this diagnosed? This condition is diagnosed with a skin exam. Sometimes, skin or fluid samples are taken from the blisters before a diagnosis is made. These samples are examined under a microscope or sent to a lab for testing. How is this treated? There is no specific cure for this condition. Your health care provider will probably prescribe medicines to help you manage  pain, recover more quickly, and avoid long-term problems. Medicines may include:  Antiviral drugs.  Anti-inflammatory drugs.  Pain medicines.  If the area involved is on your face, you may be referred to a specialist, such as an eye doctor (ophthalmologist) or an ear, nose, and throat (ENT) doctor to help you avoid eye problems, chronic pain, or disability. Follow these instructions at home: Medicines  Take medicines only as directed by your health care provider.  Apply an anti-itch or numbing cream to the affected area as directed by your health care provider. Blister and Rash Care  Take a cool bath or apply cool compresses to the area of the rash or blisters as directed by your health care provider. This may help with pain and itching.  Keep your rash covered with a loose bandage (dressing). Wear loose-fitting clothing to help ease the pain of material rubbing against the rash.  Keep your rash and blisters clean with mild soap and cool water or as directed by your health care provider.  Check your rash every day for signs of infection. These include redness, swelling, and pain that lasts or increases.  Do not pick your blisters.  Do not scratch your rash. General instructions  Rest as directed by your health care provider.  Keep all follow-up visits as directed by your health care provider. This is important.  Until your blisters scab over, your infection can cause chickenpox in people who have never had it or been vaccinated against it. To prevent this from happening, avoid contact with other people, especially: ? Babies. ? Pregnant women. ? Children who have eczema. ? Elderly people who have transplants. ? People who have chronic illnesses, such as leukemia or AIDS. Contact a health care provider if:  Your pain is not relieved with prescribed medicines.  Your pain does not get better after the rash heals.  Your rash looks infected. Signs of infection include redness,  swelling, and pain that lasts or increases. Get help right away if:  The rash is on your face or nose.  You have facial pain, pain around your eye area, or loss of feeling on one side of your face.  You have ear pain or you have ringing in your ear.  You have loss of taste.  Your condition gets worse. This information is not intended to replace advice given to you by your health care provider. Make sure you discuss any questions you have with your health care provider. Document Released: 12/03/2005 Document Revised: 07/29/2016 Document Reviewed: 10/14/2014 Elsevier Interactive Patient Education  2017 [employer].

## 2017-07-11 NOTE — Progress Notes (Signed)
Subjective:    [name] is a 83 y.o. female who presents for Preventative Services visit and chronic medical problems/med check visit.    Primary Care Provider Leyah Bocchino, Camelia Eng, PA-C here for primary care  Current Health Care Team:  Dentist, Dr. Sanjuana Kava doctor, Dr. Gershon Crane  [name], cardiology  Dr. Benson Norway, GI  Podiatry  Maikel Neisler, Camelia Eng, PA-C   Medical Services you may have received from other than [facility] providers in the past year (date may be approximate) Podiatry, GI  Exercise Current exercise habits: walks about 30 min daily.  Nutrition/Diet Current diet: in general, a "healthy" diet  limits sugar and meat not every day.  Likes vegetables.    Depression Screen Depression screen [facility] 2/9 07/11/2017  Decreased Interest 0  Down, Depressed, Hopeless 0  PHQ - 2 Score 0    Activities of Daily Living Screen/Functional Status Survey Is the patient deaf or have difficulty hearing?: No Does the patient have difficulty seeing, even when wearing glasses/contacts?: Yes Does the patient have difficulty concentrating, remembering, or making decisions?: No Does the patient have difficulty walking or climbing stairs?: No Does the patient have difficulty dressing or bathing?: No Does the patient have difficulty doing errands alone such as visiting a doctor's office or shopping?: No   Fall Risk Screen Fall Risk  07/11/2017 06/26/2016 06/14/2015  Falls in the past year? No No No    Gait Assessment: Normal gait observed yes  Advanced directives Does patient have a [place]? Yes Does patient have a Living Will? Yes  Past Medical History:  Diagnosis Date  . Allergy   . Anxiety   . Atrophic vaginitis   . Cataract    hx/o surgery, both eyes; Dr. Gershon Crane  . Diverticulosis    per 2010 colonoscopy  . Dyslipidemia   . Full dentures   . GERD (gastroesophageal reflux disease)    hx/o, resolved  . Glaucoma 2016  . H/O echocardiogram 06/22/2010     normal LVEF, moderate mitral and tricuspid regurg 07/2016 [name];  mild aortic valve stenosis, left ventricular normal, EF 55%; Dr. Gwenlyn Found  06/2010  . History of cardiovascular stress test 06/22/2010   normal bruce myocadial perfusion study, 72% EF; Dr. Gwenlyn Found  . History of mammogram    benign bilat calcifications, heterogeneously dense breasts, stable mammograms 2009-2014  . 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 Marti Sleigh gyencology oncology [place]; Dr. Lorriane Shire gynecology  . Hypertension   . Lactose intolerance   . 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 Rollene Fare  . Osteoporosis   . SVT (supraventricular tachycardia) ([place])    asymptomatic 2014, hx/o SVT, Dr. Rollene Fare  . Vitamin D deficiency     Past Surgical History:  Procedure Laterality Date  . ABDOMINAL HYSTERECTOMY Bilateral    h/o uterine cancer; oophrectomy  . CATARACT EXTRACTION     bilat  . COLONOSCOPY  08/2016   08/2016  Dr. Benson Norway advised no repeat colonoscopy;  scattered diverticla, medium hemorrhoids 2010; Dr. Benson Norway  . NM MYOCAR PERF WALL MOTION    . TOTAL HIP ARTHROPLASTY Bilateral 2008, 2010    twice left (complication on the left, once on the right; Dr. Alphonzo Severance  . TRANSTHORACIC ECHOCARDIOGRAM  02/2013   ordered  for SVT; EF 55-65%; calcified MV annulus, mod MR; RSVP increased; RA mildly dilated; mod TR; PA peak pressure 36mmHg    Social History   Social History  . Marital status: Married    Spouse name: N/A  . Number of children: 1  . Years of education: N/A   Occupational History  . Not on file.   Social History Main Topics  . Smoking status: Never Smoker  . Smokeless tobacco: Never Used  . Alcohol use No  . Drug use: No  . Sexual activity: No     Comment: walks 3x/wk,  retired, married, 2 grandchildren   Other Topics Concern  . Not on file   Social History Narrative   Married, 1 son, 2 grandchildren; exercise 4 days per week - walking, mall and [employer].  Husband has hx/o CVA, but is ambulatory.  06/2017    Family History  Problem Relation Age of Onset  . Family history unknown: Yes     Current Outpatient Prescriptions:  .  alendronate (FOSAMAX) 70 MG tablet, TAKE 1 TABLET BY MOUTH EVERY 7 DAYS WITH A FULL GLASS OF WATER AN EMPTY STOMACH, Disp: 12 tablet, Rfl: 3 .  aspirin 81 MG tablet, Take 1 tablet (81 mg total) by mouth daily., Disp: 90 tablet, Rfl: 3 .  atenolol (TENORMIN) 50 MG tablet, TAKE 1 TABLET BY MOUTH EVERY DAY, Disp: 90 tablet, Rfl: 0 .  cetirizine (ZYRTEC) 10 MG tablet, Take 1 tablet (10 mg total) by mouth at bedtime., Disp: 30 tablet, Rfl: 3 .  latanoprost (XALATAN) 0.005 % ophthalmic solution, 1 drop at bedtime., Disp: , Rfl:  .  lisinopril (PRINIVIL,ZESTRIL) 10 MG tablet, TAKE 1 TABLET BY MOUTH EVERY DAY, Disp: 30 tablet, Rfl: 0 .  simvastatin (ZOCOR) 40 MG tablet, TAKE 1 TABLET BY MOUTH AT BEDTIME., Disp: 90 tablet, Rfl: 0  No Known Allergies  History reviewed: allergies, current medications, past family history, past medical history, past social history, past surgical history and problem list  Chronic issues discussed: Compliant with medications for BP, cholesterol, Vit D, aspirin daily.  Compliant with fosamax although gives her some GI upset.     Acute issues discussed: none  Objective:     Biometrics BP 112/68   Pulse 68   Ht 5\' 1"  (1.549 m)   Wt 153 lb 6.4 oz (69.6 kg)   SpO2 95%   BMI 28.98 kg/m   Wt Readings from Last 3 Encounters:  07/11/17 153 lb 6.4 oz (69.6 kg)  06/25/17 157 lb (71.2 kg)  04/02/17 155 lb 9.6 oz (70.6 kg)    Cognitive Testing  Alert? Yes  Normal Appearance?Yes  Oriented to person? Yes  Place? Yes   Time? Yes  Recall of three objects?  Yes  Can perform simple calculations?  Yes  Displays appropriate judgment?Yes  Can read the correct time from a watch face?Yes  General appearance: alert, no distress, WD/WN, AA  female  Nutritional Status: Inadequate calore intake? no Loss of muscle mass? no Loss of fat beneath skin? no Localized or general edema? no Diminished functional status? no  Other pertinent exam:  General appearance: alert, no distress, WD/WN, pleasant AA female Skin:  unremarkable HEENT: normocephalic, conjunctiva/corneas normal, sclerae anicteric, PERRLA, EOMi, nares patent, no discharge or erythema, pharynx normal Oral cavity: MMM, tongue normal, upper and lower dentures Neck: supple, no lymphadenopathy, no thyromegaly, no masses, normal ROM, no bruits Chest: non tender, normal shape and expansion Heart: RRR, normal S1, S2, no murmurs Lungs: CTA  bilaterally, no wheezes, rhonchi, or rales Abdomen: +bs, soft, vertical lower abdominal surgical scar, otherwise non tender, non distended, no masses, no hepatomegaly, no splenomegaly, no bruits Back: non tender, normal ROM, no scoliosis Musculoskeletal: mildly reduced left internal hip ROM, otherwise upper extremities non tender, no obvious deformity, normal ROM throughout, lower extremities non tender, no obvious deformity, normal ROM throughout Extremities: +bilat lower leg mild varicosities, no edema, no cyanosis, no clubbing Pulses: 2+ symmetric, upper and lower extremities, normal cap refill Neurological: alert, oriented x 3, CN2-12 intact, strength normal upper extremities and lower extremities, sensation normal throughout, DTRs 2+ throughout, no cerebellar signs, gait normal Psychiatric: normal affect, behavior normal, pleasant  Breast/pelvic/rectal - declined today   Assessment:   Encounter Diagnoses  Name Primary?  . Medicare annual wellness visit, subsequent Yes  . Essential hypertension   . Lown Ganong Levine syndrome   . 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 AVNRT (AV  nodal re-entry tachycardia) ([place])   . Allergic rhinitis, unspecified seasonality, unspecified trigger   . Dyslipidemia   . Vitamin D deficiency   . Renal insufficiency   . Osteoporosis, unspecified osteoporosis type, unspecified pathological fracture presence   . Bilateral hearing loss, unspecified hearing loss type   . Glaucoma, unspecified glaucoma type, unspecified laterality   . Vaccine counseling   . Need for shingles vaccine   . Varicose vein of leg     Plan:   A preventative services visit was completed today.  During the course of the visit today, we discussed and counseled about appropriate screening and preventive services.  A health risk assessment was established today that included a review of current medications, allergies, social history, family history, medical and preventative health history, biometrics, and preventative screenings to identify potential safety concerns or impairments.  A personalized plan was printed today for your records and use.   Personalized health advice and education was given today to reduce health risks and promote self management and wellness.  Information regarding end of life planning was discussed today.  Conditions/risks identified: None in particular discussed fall prevention, diet, exercise, staying active mentally and physical.    She is a healthy 84yo, better health than average.   Chronic problems discussed today: C/t current medications for BP, cholesterol, Aspirin daily.  Labs today.  Osteoporosis on bone density 2016.  Discussed fosamax and gave other options as alternate treatments. She wants to stick with fosamax for now.   Go for updated bone density scan.  Acute problems discussed today: none  Recommendations:  I recommend a yearly ophthalmology/optometry visit for glaucoma screening and eye checkup  I recommended a yearly dental visit for hygiene and checkup  Advanced directives - up to date  Advised she see Dr.  [name] as planned soon for cardiology f/u.  She will discuss trying to come off Lisinopril as she thinks its making her fatigued.   She saw GI last year, Dr. Benson Norway, notes reviewed.   advised no additional colonoscopy  Advised breast/pelvic exam next year  Referrals today: none  Immunizations: I recommended a yearly influenza vaccine, typically in September when the vaccine is usually available Is the Pneumococcal vaccine up to date: yes. Is the Shingles vaccine up to date: no.  Gave script for her to get Shingrix elsewhere at lower cost Is the Td/Tdap vaccine up to date: yes.  Onie was seen today for [employer].  Diagnoses and all orders for this visit:  Medicare annual wellness visit, subsequent  Essential hypertension -     Comprehensive metabolic panel -     CBC -     Lipid panel -     Iron and TIBC  Lown Ganong Levine syndrome -     CBC -     Iron and TIBC  Disorders of both mitral and tricuspid valves  AVNRT (AV nodal re-entry tachycardia) ([facility])  Allergic rhinitis, unspecified seasonality, unspecified trigger  Dyslipidemia -     Lipid panel  Vitamin D deficiency -     VITAMIN D 25 Hydroxy (Vit-D Deficiency, Fractures)  Renal insufficiency -     Comprehensive metabolic panel  Osteoporosis, unspecified osteoporosis type, unspecified pathological fracture presence -     VITAMIN D 25 Hydroxy (Vit-D Deficiency, Fractures) -     DG Bone Density; Future  Bilateral hearing loss, unspecified hearing loss type  Glaucoma, unspecified glaucoma type, unspecified laterality  Vaccine counseling  Need for shingles vaccine  Varicose vein of leg   Medicare Attestation A preventative services visit was completed today.  During the course of the visit the patient was educated and counseled about appropriate screening and preventive services.  A health risk assessment was established with the patient that included a review of current medications, allergies,  social history, family history, medical and preventative health history, biometrics, and preventative screenings to identify potential safety concerns or impairments.  A personalized plan was printed today for the patient's records and use.   Personalized health advice and education was given today to reduce health risks and promote self management and wellness.  Information regarding end of life planning was discussed today.  Crisoforo Oxford, PA-C   07/11/2017

## 2017-07-12 ENCOUNTER — Other Ambulatory Visit: Payer: Self-pay | Admitting: Medical

## 2017-07-12 LAB — VITAMIN D 25 HYDROXY (VIT D DEFICIENCY, FRACTURES): VIT D 25 HYDROXY: 31 ng/mL (ref 30–100)

## 2017-07-12 MED ORDER — CHOLECALCIFEROL 25 MCG (1000 UT) PO CAPS: 1000.0000 [IU] | ORAL_CAPSULE | Freq: Every day | ORAL | 3 refills | Status: DC

## 2017-07-12 MED ORDER — CALCIUM CARBONATE 600 MG PO TABS: 600.0000 mg | ORAL_TABLET | Freq: Two times a day (BID) | ORAL | 3 refills | Status: DC

## 2017-07-12 MED ORDER — SIMVASTATIN 40 MG PO TABS: 40.0000 mg | ORAL_TABLET | Freq: Every day | ORAL | 3 refills | Status: DC

## 2017-07-12 MED ORDER — ASPIRIN 81 MG PO TABS: 81.0000 mg | ORAL_TABLET | Freq: Every day | ORAL | 3 refills | Status: DC

## 2017-07-28 ENCOUNTER — Other Ambulatory Visit: Payer: Self-pay | Admitting: Medical

## 2017-08-13 ENCOUNTER — Ambulatory Visit (INDEPENDENT_AMBULATORY_CARE_PROVIDER_SITE_OTHER): Payer: Medicare Other | Admitting: Internal Medicine

## 2017-08-13 ENCOUNTER — Encounter: Payer: Self-pay | Admitting: Internal Medicine

## 2017-08-13 VITALS — BP 138/72 | HR 62 | Ht 61.0 in | Wt 159.4 lb

## 2017-08-13 DIAGNOSIS — I340 Nonrheumatic mitral (valve) insufficiency: Secondary | ICD-10-CM

## 2017-08-13 DIAGNOSIS — I10 Essential (primary) hypertension: Secondary | ICD-10-CM

## 2017-08-13 DIAGNOSIS — I071 Rheumatic tricuspid insufficiency: Secondary | ICD-10-CM | POA: Diagnosis not present

## 2017-08-13 DIAGNOSIS — I456 Pre-excitation syndrome: Secondary | ICD-10-CM

## 2017-08-13 NOTE — Patient Instructions (Signed)
Your physician wants you to follow-up in: ONE YEAR with Dr. Hilty. You will receive a reminder letter in the mail two months in advance. If you don't receive a letter, please call our office to schedule the follow-up appointment.  

## 2017-08-13 NOTE — Progress Notes (Signed)
OFFICE NOTE  Chief Complaint:  No complaints  Primary Care Physician: Carlena Hurl, PA-C  HPI:  [name] is a pleasant 83 year old female previously followed by Dr. Rollene Fare. She's here today to establish cardiac care with me. She has history of hypertension, remote bilateral hip placements and redo hip surgery for failed prosthesis in 2011. 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 She was previously on flecainide but has been taken off of that. She's had no recurrence of her palpitations on beta blockers. She had a negative Myoview for ischemia in 2011 an echocardiogram in 2014 which showed normal systolic function and moderate mitral regurgitation.  She has no complaints today.  The pleasure see [name] back in the office today. She is without any complaints. She continues to be active and denies any worsening shortness of breath or chest pain. She's had no further recurrent palpitations. Heart rate seems well controlled on atenolol. Recently she's had some issues with lower blood pressure and her lisinopril HCTZ was changed to lisinopril. This is associated with a small increase in blood pressure to the normal range. She now feels well again. Should be noted she had an echocardiogram in 2014 which showed moderate MR and moderate TR.  08/09/2016  [name] returns today for follow-up. She continues to be without complaints. She walks regularly and has no shortness of breath or worsening chest pain. Blood pressure is well-controlled today. She just had a physical which was unremarkable. Cholesterol has been well controlled -recent total cholesterol 146, HDL 42, LDL 74 and triglycerides 149. She just had a repeat echocardiogram which shows persistent moderate MR and TR with an EF of 65%. This appears to be unchanged compared to her prior echo in 2014. There is very mild pulmonary hypertension but she is asymptomatic with  this.  08/13/2017  [name] was seen today follow-up. She denies any recurrent SVT. There is no chest pain or worsening shortness of breath. She says she walks regularly. She had a recent cholesterol profile which is very stable showing total cholesterol is above 150 and LDL 79. She's had normal LV function by echo within the past several years and moderate MR and TR. She is asymptomatic. I don't anticipate any valve surgery that will be necessary.  PMHx:  Past Medical History:  Diagnosis Date  . Allergy   . Anxiety   . Atrophic vaginitis   . Cataract    hx/o surgery, both eyes; Dr. Gershon Crane  . Diverticulosis    per 2010 colonoscopy  . Dyslipidemia   . Full dentures   . GERD (gastroesophageal reflux disease)    hx/o, resolved  . Glaucoma 2016  . H/O echocardiogram 06/22/2010   normal LVEF, moderate mitral and tricuspid regurg 07/2016 [name];  mild aortic valve stenosis, left ventricular normal, EF 55%; Dr. Gwenlyn Found  06/2010  . History of cardiovascular stress test 06/22/2010   normal bruce myocadial perfusion study, 72% EF; Dr. Gwenlyn Found  . History of mammogram    benign bilat calcifications, heterogeneously dense breasts, stable mammograms 2009-2014  . 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 Marti Sleigh gyencology oncology [place]; Dr. Lorriane Shire gynecology  . Hypertension   . Lactose intolerance   . Lown Jerilynn Birkenhead syndrome    short  PR interval with increased conduction across AV node; Dr. Rollene Fare  . Osteoporosis   . SVT (supraventricular tachycardia) ([place])    asymptomatic 2014, hx/o SVT, Dr. Rollene Fare  . Vitamin D deficiency     Past Surgical History:  Procedure Laterality Date  . ABDOMINAL HYSTERECTOMY Bilateral    h/o uterine cancer; oophrectomy  . CATARACT EXTRACTION     bilat  . COLONOSCOPY   08/2016   08/2016  Dr. Benson Norway advised no repeat colonoscopy;  scattered diverticla, medium hemorrhoids 2010; Dr. Benson Norway  . NM MYOCAR PERF WALL MOTION    . TOTAL HIP ARTHROPLASTY Bilateral 2008, 2010    twice left (complication on the left, once on the right; Dr. Alphonzo Severance  . TRANSTHORACIC ECHOCARDIOGRAM  02/2013   ordered for SVT; EF 55-65%; calcified MV annulus, mod MR; RSVP increased; RA mildly dilated; mod TR; PA peak pressure 66mmHg    FAMHx:  Family History  Problem Relation Age of Onset  . Family history unknown: Yes    SOCHx:   reports that she has never smoked. She has never used smokeless tobacco. She reports that she does not drink alcohol or use drugs.  ALLERGIES:  No Known Allergies  ROS: Pertinent items noted in HPI and remainder of comprehensive ROS otherwise negative.  HOME MEDS: Current Outpatient Prescriptions  Medication Sig Dispense Refill  . alendronate (FOSAMAX) 70 MG tablet TAKE 1 TABLET BY MOUTH EVERY 7 DAYS WITH A FULL GLASS OF WATER AN EMPTY STOMACH 12 tablet 3  . aspirin 81 MG tablet Take 1 tablet (81 mg total) by mouth daily. 90 tablet 3  . atenolol (TENORMIN) 50 MG tablet TAKE 1 TABLET BY MOUTH EVERY DAY 90 tablet 0  . calcium carbonate (CALCIUM 600) 600 MG TABS tablet Take 1 tablet (600 mg total) by mouth 2 (two) times daily with a meal. 180 tablet 3  . cetirizine (ZYRTEC) 10 MG tablet Take 1 tablet (10 mg total) by mouth at bedtime. 30 tablet 3  . Cholecalciferol (D3 HIGH POTENCY) 1000 units capsule Take 1 capsule (1,000 Units total) by mouth daily. 90 capsule 3  . latanoprost (XALATAN) 0.005 % ophthalmic solution 1 drop at bedtime.    Marland Kitchen lisinopril (PRINIVIL,ZESTRIL) 10 MG tablet TAKE 1 TABLET BY MOUTH EVERY DAY 30 tablet 5  . simvastatin (ZOCOR) 40 MG tablet Take 1 tablet (40 mg total) by mouth at bedtime. 90 tablet 3   No current facility-administered medications for this visit.     LABS/IMAGING: No results found for this or any previous visit (from  the past 48 hour(s)). No results found.  VITALS: BP 138/72   Pulse 62   Ht 5\' 1"  (1.549 m)   Wt 159 lb 6.4 oz (72.3 kg)   BMI 30.12 kg/m   EXAM: General appearance: alert and no distress Neck: no carotid bruit, no JVD and thyroid not enlarged, symmetric, no tenderness/mass/nodules Lungs: clear to auscultation bilaterally Heart: regular rate and rhythm, S1, S2 normal, systolic murmur: holosystolic 3/6, blowing at apex and 2nd systolic murmur: early systolic 2/6, crescendo at 2nd right intercostal space Abdomen: soft, non-tender; bowel sounds normal; no masses,  no organomegaly Extremities: extremities normal, atraumatic, no cyanosis or edema Pulses: 2+ and symmetric Skin: Skin color, texture, turgor normal. No rashes or lesions Neurologic: Grossly normal Psych: Mood, affect normal  EKG: Normal sinus rhythm at 62-personally reviewed  ASSESSMENT: 1. LGL syndrome 2. History of AVNRT 3. Hypertension 4. Dyslipidemia 5. Moderate MR and moderate TR (2017), LVEF 65%  PLAN: 1.   Mrs. Pryer is clinically stable without worsening shortness of breath or chest pain. She can exercise regularly without any problems. She's had no recurrent AVNRT. Hypertension is at goal. She does report some fatigue however I don't believe this is related to medicines although she is uncertain about that. Her cholesterol has been at goal for several years. We would repeat echo perhaps in the 2-3 years to ensure valve stability. I doubt she will need any kind of valve surgery or be interested in the future. Follow-up with me annually or sooner as necessary.  Pixie Casino, MD, [facility] Attending Cardiologist [place] 08/13/2017, 9:28 AM

## 2017-10-03 ENCOUNTER — Other Ambulatory Visit (INDEPENDENT_AMBULATORY_CARE_PROVIDER_SITE_OTHER): Payer: Medicare Other

## 2017-10-03 DIAGNOSIS — Z23 Encounter for immunization: Secondary | ICD-10-CM | POA: Diagnosis not present

## 2017-10-14 DIAGNOSIS — H401131 Primary open-angle glaucoma, bilateral, mild stage: Secondary | ICD-10-CM | POA: Insufficient documentation

## 2017-11-18 ENCOUNTER — Other Ambulatory Visit: Payer: Self-pay | Admitting: Medical

## 2017-11-18 NOTE — Telephone Encounter (Signed)
Called and l/m for pt call us back to make an appt, for refill

## 2017-11-18 NOTE — Telephone Encounter (Signed)
Pt called in and made a medcheck appt for end of month. Please refill medication.

## 2017-12-02 ENCOUNTER — Encounter: Payer: Self-pay | Admitting: Medical

## 2017-12-02 ENCOUNTER — Ambulatory Visit (INDEPENDENT_AMBULATORY_CARE_PROVIDER_SITE_OTHER): Payer: Medicare Other | Admitting: Medical

## 2017-12-02 VITALS — BP 114/72 | HR 65 | Wt 159.4 lb

## 2017-12-02 DIAGNOSIS — I10 Essential (primary) hypertension: Secondary | ICD-10-CM

## 2017-12-02 MED ORDER — ATENOLOL 50 MG PO TABS: 50.0000 mg | ORAL_TABLET | Freq: Every day | ORAL | 2 refills | Status: DC

## 2017-12-02 MED ORDER — LISINOPRIL 10 MG PO TABS: 10.0000 mg | ORAL_TABLET | Freq: Every day | ORAL | 2 refills | Status: DC

## 2017-12-02 NOTE — Progress Notes (Signed)
No charge.   Visit was not warranted, but was prompted by refill from pharmacy.

## 2017-12-16 ENCOUNTER — Encounter: Payer: Medicare Other | Admitting: Medical

## 2018-02-06 ENCOUNTER — Ambulatory Visit
Admission: RE | Admit: 2018-02-06 | Discharge: 2018-02-06 | Disposition: A | Discharge status: Home / Self Care | Payer: Medicare Other | Source: Ambulatory Visit | Attending: Medical | Admitting: Medical

## 2018-02-06 ENCOUNTER — Ambulatory Visit: Payer: Medicare Other | Admitting: Medical

## 2018-02-06 VITALS — BP 136/70 | HR 79 | Wt 161.2 lb

## 2018-02-06 DIAGNOSIS — Z8709 Personal history of other diseases of the respiratory system: Secondary | ICD-10-CM | POA: Diagnosis not present

## 2018-02-06 DIAGNOSIS — R0781 Pleurodynia: Secondary | ICD-10-CM

## 2018-02-06 DIAGNOSIS — J069 Acute upper respiratory infection, unspecified: Secondary | ICD-10-CM

## 2018-02-06 NOTE — Progress Notes (Signed)
Subjective: Chief Complaint  Patient presents with  . pain under breast on side    started about about, no coughing, had cold couples, no injury , feels swollen    Here for feeling of bloating up under left rib cage x 1 week.    No rash, no bruising, no redness.   Sore in same area.  No lumps in the breast.  No lymph nodes swollen fever.   No urinary issues, no bowel issues.  No fever.  No back pain.   No NVD, no recent fall or injury.  Had mild cough and cold few weeks ago.  No other aggravating or relieving factors. No other complaint.   Past Medical History:  Diagnosis Date  . Allergy   . Anxiety   . Atrophic vaginitis   . Cataract    hx/o surgery, both eyes; Dr. Gershon Crane  . Diverticulosis    per 2010 colonoscopy  . Dyslipidemia   . Full dentures   . GERD (gastroesophageal reflux disease)    hx/o, resolved  . Glaucoma 2016  . H/O echocardiogram 06/22/2010   normal LVEF, moderate mitral and tricuspid regurg 07/2016 [name];  mild aortic valve stenosis, left ventricular normal, EF 55%; Dr. Gwenlyn Found  06/2010  . History of cardiovascular stress test 06/22/2010   normal bruce myocadial perfusion study, 72% EF; Dr. Gwenlyn Found  . History of mammogram    benign bilat calcifications, heterogeneously dense breasts, stable mammograms 2009-2014  . 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 Marti Sleigh gyencology oncology [place]; Dr. Lorriane Shire gynecology  . Hypertension   . Lactose intolerance   . 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 Rollene Fare  . Osteoporosis   . SVT (supraventricular tachycardia) ([place])    asymptomatic 2014, hx/o SVT, Dr. Rollene Fare  . Vitamin D deficiency    Current Outpatient Medications on File Prior to Visit  Medication Sig Dispense Refill  .  alendronate (FOSAMAX) 70 MG tablet TAKE 1 TABLET BY MOUTH EVERY 7 DAYS WITH A FULL GLASS OF WATER AN EMPTY STOMACH 12 tablet 3  . aspirin 81 MG tablet Take 1 tablet (81 mg total) by mouth daily. 90 tablet 3  . atenolol (TENORMIN) 50 MG tablet Take 1 tablet (50 mg total) by mouth daily. 90 tablet 2  . calcium carbonate (CALCIUM 600) 600 MG TABS tablet Take 1 tablet (600 mg total) by mouth 2 (two) times daily with a meal. 180 tablet 3  . cetirizine (ZYRTEC) 10 MG tablet Take 1 tablet (10 mg total) by mouth at bedtime. 30 tablet 3  . Cholecalciferol (D3 HIGH POTENCY) 1000 units capsule Take 1 capsule (1,000 Units total) by mouth daily. 90 capsule 3  . latanoprost (XALATAN) 0.005 % ophthalmic solution 1 drop at bedtime.    Marland Kitchen lisinopril (PRINIVIL,ZESTRIL) 10 MG tablet Take 1 tablet (10 mg total) by mouth daily. 90 tablet 2  . simvastatin (ZOCOR) 40 MG tablet Take 1 tablet (40 mg total) by mouth at bedtime. 90 tablet 3   No current facility-administered medications on file prior to visit.    Past Surgical History:  Procedure Laterality Date  . ABDOMINAL HYSTERECTOMY Bilateral    h/o uterine cancer; oophrectomy  . CATARACT EXTRACTION     bilat  . COLONOSCOPY  08/2016   08/2016  Dr. Benson Norway advised no repeat colonoscopy;  scattered diverticla, medium hemorrhoids 2010; Dr. Benson Norway  . NM MYOCAR PERF WALL MOTION    . TOTAL HIP ARTHROPLASTY Bilateral 2008, 2010    twice left (complication on the left, once on the right; Dr. Alphonzo Severance  . TRANSTHORACIC ECHOCARDIOGRAM  02/2013   ordered for SVT; EF 55-65%; calcified MV annulus, mod MR; RSVP increased; RA mildly dilated; mod TR; PA peak pressure 3mmHg   ROS as in subjective   Objective BP 136/70   Pulse 79   Wt 161 lb 3.2 oz (73.1 kg)   SpO2 99%   BMI 30.46 kg/m   Gen: wd, wn, nad Skin unremarkable, no redness, no bruising Chest: she is tender over the left anterolateral chest wall over the 5th - 6th rib area.  But no deformity, normal I:E.   Breast: no lesions, no lumps, no skin changes, no lymphadenopathy. Exam chaperoned  By nurse.   Assessment: Encounter Diagnoses  Name Primary?  . Rib pain on left side Yes  . Recent URI       Plan: She may just have chest wall inflammation/pleuritic pain from recent upper respiratory infection, but we will send for xrays for further evaluation.    Shanon was seen today for pain under breast on side.  Diagnoses and all orders for this visit:  Rib pain on left side -     DG Chest 2 View; Future -     DG Ribs Unilateral Left; Future  Recent URI -     DG Chest 2 View; Future -     DG Ribs Unilateral Left; Future

## 2018-02-07 LAB — HM DEXA SCAN

## 2018-02-14 ENCOUNTER — Telehealth: Payer: Self-pay | Admitting: Medical

## 2018-02-14 NOTE — Telephone Encounter (Signed)
Her updated bone scan does show osteoporosis.   Continue Fosamax, and the plan will be to use this another 3 years for a total of 5 years.  Exercise regularly including aerobic and weight bearing exercise.   Weight-bearing physical activity and exercises that improve balance and posture can strengthen bones and reduce the chance of a fracture. The more active and fit you are as you age, the less likely you are to fall and break a bone.   Good nutrition. Eat a healthy diet and make certain that you're getting 1200mg  of calcium daily in the diet from dairy (typically 4 servings) or supplements OTC.   Also they should be getting Vitamin D through eating fish, seafood, getting sun exposure, and using either OTC Vitamin D supplement such as 1000-2000 IU daily unless instructed by [place] otherwise.   If they smoke they should quit smoking. Smoking cigarettes speeds up bone loss.  Limit alcohol. If you choose to drink alcohol, do so in moderation. For healthy women, that means up to one drink a day.

## 2018-02-17 NOTE — Telephone Encounter (Signed)
Called and notified of this information.

## 2018-03-30 ENCOUNTER — Other Ambulatory Visit: Payer: Self-pay | Admitting: Medical

## 2018-03-31 ENCOUNTER — Encounter: Payer: Self-pay | Admitting: Medical

## 2018-03-31 ENCOUNTER — Ambulatory Visit: Payer: Medicare Other | Admitting: Medical

## 2018-03-31 VITALS — BP 140/80 | HR 79 | Temp 97.5°F | Ht 62.0 in | Wt 161.2 lb

## 2018-03-31 DIAGNOSIS — R0602 Shortness of breath: Secondary | ICD-10-CM | POA: Diagnosis not present

## 2018-03-31 DIAGNOSIS — R112 Nausea with vomiting, unspecified: Secondary | ICD-10-CM | POA: Diagnosis not present

## 2018-03-31 DIAGNOSIS — J3489 Other specified disorders of nose and nasal sinuses: Secondary | ICD-10-CM

## 2018-03-31 MED ORDER — AMOXICILLIN 875 MG PO TABS: 875.0000 mg | ORAL_TABLET | Freq: Two times a day (BID) | ORAL | 0 refills | Status: DC

## 2018-03-31 MED ORDER — ONDANSETRON HCL 4 MG PO TABS: 4.0000 mg | ORAL_TABLET | Freq: Three times a day (TID) | ORAL | 0 refills | Status: DC | PRN

## 2018-03-31 NOTE — Progress Notes (Signed)
Subjective: Chief Complaint  Patient presents with  . Acute Visit    headache, sneezing, dizziness, fatigue   Headache, sick to stomach feeling, dizzy, fatigued, a little SOB x a week.  Had headache all day Saturday 2 days ago.   A little better yesterday, but worse again today.  Wonder about illness.  No sick contacts.   No fever.  No sore throat, no ear pain.   No cough.  Has had some sneezing, allergens in the air.   Vomited 2 days ago about 2 times.   Loose stool twice today.   No urinary c/o.  No leg swelling.  Using Theraflu and tylenol.  No chest pain.  No other aggravating or relieving factors. No other complaint.  Past Medical History:  Diagnosis Date  . Allergy   . Anxiety   . Atrophic vaginitis   . Cataract    hx/o surgery, both eyes; Dr. Gershon Crane  . Diverticulosis    per 2010 colonoscopy  . Dyslipidemia   . Full dentures   . GERD (gastroesophageal reflux disease)    hx/o, resolved  . Glaucoma 2016  . H/O echocardiogram 06/22/2010   normal LVEF, moderate mitral and tricuspid regurg 07/2016 [name];  mild aortic valve stenosis, left ventricular normal, EF 55%; Dr. Gwenlyn Found  06/2010  . History of cardiovascular stress test 06/22/2010   normal bruce myocadial perfusion study, 72% EF; Dr. Gwenlyn Found  . History of mammogram    benign bilat calcifications, heterogeneously dense breasts, stable mammograms 2009-2014  . 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 Marti Sleigh gyencology oncology [place]; Dr. Lorriane Shire gynecology  . Hypertension   . Lactose intolerance   . 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 Rollene Fare  . Osteoporosis   . SVT (supraventricular tachycardia) ([place])    asymptomatic 2014, hx/o SVT, Dr. Rollene Fare  . Vitamin D deficiency    Current  Outpatient Medications on File Prior to Visit  Medication Sig Dispense Refill  . alendronate (FOSAMAX) 70 MG tablet TAKE 1 TABLET BY MOUTH EVERY 7 DAYS WITH A FULL GLASS OF WATER AN EMPTY STOMACH 12 tablet 3  . aspirin 81 MG tablet Take 1 tablet (81 mg total) by mouth daily. 90 tablet 3  . atenolol (TENORMIN) 50 MG tablet Take 1 tablet (50 mg total) by mouth daily. 90 tablet 2  . calcium carbonate (CALCIUM 600) 600 MG TABS tablet Take 1 tablet (600 mg total) by mouth 2 (two) times daily with a meal. 180 tablet 3  . cetirizine (ZYRTEC) 10 MG tablet Take 1 tablet (10 mg total) by mouth at bedtime. 30 tablet 3  . Cholecalciferol (D3 HIGH POTENCY) 1000 units capsule Take 1 capsule (1,000 Units total) by mouth daily. 90 capsule 3  . latanoprost (XALATAN) 0.005 % ophthalmic solution 1 drop at bedtime.    Marland Kitchen lisinopril (PRINIVIL,ZESTRIL) 10 MG tablet Take 1 tablet (10 mg total) by mouth daily. 90 tablet 2  . simvastatin (ZOCOR) 40 MG tablet Take 1 tablet (40 mg total) by mouth at bedtime. 90 tablet 3   No current facility-administered medications on file prior to visit.    ROS as in subjective    Objective: BP 140/80 (BP Location: Right Arm, Patient Position: Sitting, Cuff Size: Normal)   Pulse 79  Temp (!) 97.5 F (36.4 C) (Oral)   Ht 5\' 2"  (1.575 m)   Wt 161 lb 3.2 oz (73.1 kg)   SpO2 99%   BMI 29.48 kg/m   Wt Readings from Last 3 Encounters:  03/31/18 161 lb 3.2 oz (73.1 kg)  02/06/18 161 lb 3.2 oz (73.1 kg)  12/02/17 159 lb 6.4 oz (72.3 kg)   BP Readings from Last 3 Encounters:  03/31/18 140/80  02/06/18 136/70  12/02/17 114/72   General appearance: alert, no distress, WD/WN, fatigued appearing HEENT: normocephalic, sclerae anicteric, TMs with serous effusion, nares patent, no discharge, mild erythema, pharynx normal Oral cavity: MMM, no lesions Neck: supple, no lymphadenopathy, no thyromegaly, no masses Heart: RRR, normal S1, S2, no murmurs Lungs: CTA bilaterally, no  wheezes, rhonchi, or rales Abdomen: +bs, soft, non tender, non distended, no masses, no hepatomegaly, no splenomegaly Pulses: 2+ symmetric, upper and lower extremities, normal cap refill Ext: no edema    Assessment: Encounter Diagnoses  Name Primary?  . Sinus pressure Yes  . SOB (shortness of breath)   . Nausea and vomiting, intractability of vomiting not specified, unspecified vomiting type      Plan: PFT normal. discussed differential.  She does have symptoms suggesting sinusitis and associated nausea.   Begin medications below, rest, hydrate well.  If not improving in the next 3-4 days, call or return.   If new or different symptoms, call or return.  [name] was seen today for acute visit.  Diagnoses and all orders for this visit:  Sinus pressure  SOB (shortness of breath) -     Spirometry with graph  Nausea and vomiting, intractability of vomiting not specified, unspecified vomiting type  Other orders -     ondansetron (ZOFRAN) 4 MG tablet; Take 1 tablet (4 mg total) by mouth every 8 (eight) hours as needed for nausea or vomiting. -     amoxicillin (AMOXIL) 875 MG tablet; Take 1 tablet (875 mg total) by mouth 2 (two) times daily.

## 2018-03-31 NOTE — Telephone Encounter (Signed)
Would you like patient to have an appointment before refilling this medication?

## 2018-04-10 ENCOUNTER — Other Ambulatory Visit: Payer: Self-pay | Admitting: Medical

## 2018-04-24 ENCOUNTER — Other Ambulatory Visit: Payer: Self-pay | Admitting: Medical

## 2018-04-24 NOTE — Telephone Encounter (Signed)
Is this refill appropriate?  

## 2018-05-26 ENCOUNTER — Telehealth: Payer: Self-pay | Admitting: Family Medicine

## 2018-05-26 ENCOUNTER — Other Ambulatory Visit: Payer: Self-pay | Admitting: Medical

## 2018-05-26 NOTE — Telephone Encounter (Signed)
Called pt back, reached voice mail.  Advised refills are still notated for her meds.

## 2018-05-26 NOTE — Telephone Encounter (Signed)
Pt scheduled awv for 07/14/18 .  She needs her cholesterol and bp meds refilled to [facility]

## 2018-05-26 NOTE — Telephone Encounter (Signed)
Her refills from last refill are all still active.  She should be able to just call pharamcy

## 2018-07-14 ENCOUNTER — Ambulatory Visit: Payer: Medicare Other | Admitting: Medical

## 2018-07-14 ENCOUNTER — Encounter: Payer: Self-pay | Admitting: Medical

## 2018-07-14 VITALS — BP 120/80 | HR 66 | Ht 62.0 in | Wt 161.4 lb

## 2018-07-14 DIAGNOSIS — I071 Rheumatic tricuspid insufficiency: Secondary | ICD-10-CM

## 2018-07-14 DIAGNOSIS — Z23 Encounter for immunization: Secondary | ICD-10-CM

## 2018-07-14 DIAGNOSIS — N289 Disorder of kidney and ureter, unspecified: Secondary | ICD-10-CM

## 2018-07-14 DIAGNOSIS — I10 Essential (primary) hypertension: Secondary | ICD-10-CM | POA: Diagnosis not present

## 2018-07-14 DIAGNOSIS — E559 Vitamin D deficiency, unspecified: Secondary | ICD-10-CM

## 2018-07-14 DIAGNOSIS — E785 Hyperlipidemia, unspecified: Secondary | ICD-10-CM

## 2018-07-14 DIAGNOSIS — J309 Allergic rhinitis, unspecified: Secondary | ICD-10-CM

## 2018-07-14 DIAGNOSIS — R5383 Other fatigue: Secondary | ICD-10-CM | POA: Insufficient documentation

## 2018-07-14 DIAGNOSIS — Z7185 Encounter for immunization safety counseling: Secondary | ICD-10-CM

## 2018-07-14 DIAGNOSIS — Z0000 Encounter for general adult medical examination without abnormal findings: Secondary | ICD-10-CM

## 2018-07-14 DIAGNOSIS — M810 Age-related osteoporosis without current pathological fracture: Secondary | ICD-10-CM

## 2018-07-14 DIAGNOSIS — I340 Nonrheumatic mitral (valve) insufficiency: Secondary | ICD-10-CM

## 2018-07-14 DIAGNOSIS — I081 Rheumatic disorders of both mitral and tricuspid valves: Secondary | ICD-10-CM

## 2018-07-14 DIAGNOSIS — H409 Unspecified glaucoma: Secondary | ICD-10-CM

## 2018-07-14 DIAGNOSIS — I456 Pre-excitation syndrome: Secondary | ICD-10-CM

## 2018-07-14 DIAGNOSIS — I700 Atherosclerosis of aorta: Secondary | ICD-10-CM

## 2018-07-14 DIAGNOSIS — I8390 Asymptomatic varicose veins of unspecified lower extremity: Secondary | ICD-10-CM

## 2018-07-14 DIAGNOSIS — Z7189 Other specified counseling: Secondary | ICD-10-CM

## 2018-07-14 DIAGNOSIS — R7989 Other specified abnormal findings of blood chemistry: Secondary | ICD-10-CM

## 2018-07-14 NOTE — Patient Instructions (Addendum)
Thanks for trusting us with your health care and for coming in for a physical today.  Below are some general recommendations I have for you:  Yearly screenings See your eye doctor yearly for routine vision care. See your dentist yearly for routine dental care including hygiene visits twice yearly. See me here yearly for a routine physical and preventative care visit   Please follow up yearly for a physical.    I have included other useful information below for your review.  Preventative Care for Adults - Female      MAINTAIN REGULAR HEALTH EXAMS:  A routine yearly physical is a good way to check in with your primary care provider about your health and preventive screening. It is also an opportunity to share updates about your health and any concerns you have, and receive a thorough all-over exam.   Most health insurance companies pay for at least some preventative services.  Check with your health plan for specific coverages.  WHAT PREVENTATIVE SERVICES DO WOMEN NEED?  Adult women should have their weight and blood pressure checked regularly.   Women age 35 and older should have their cholesterol levels checked regularly.  Women should be screened for cervical cancer with a Pap smear and pelvic exam beginning at either age 21, or 3 years after they become sexually activity.    Breast cancer screening generally begins at age 40 with a mammogram and breast exam by your primary care provider.    Beginning at age 50 and continuing to age 75, women should be screened for colorectal cancer.  Certain people may need continued testing until age 85.  Updating vaccinations is part of preventative care.  Vaccinations help protect against diseases such as the flu.  Osteoporosis is a disease in which the bones lose minerals and strength as we age. Women ages 65 and over should discuss this with their caregivers, as should women after menopause who have other risk factors.  Lab tests are  generally done as part of preventative care to screen for anemia and blood disorders, to screen for problems with the kidneys and liver, to screen for bladder problems, to check blood sugar, and to check your cholesterol level.  Preventative services generally include counseling about diet, exercise, avoiding tobacco, drugs, excessive alcohol consumption, and sexually transmitted infections.    GENERAL RECOMMENDATIONS FOR GOOD HEALTH:  Healthy diet:  Eat a variety of foods, including fruit, vegetables, animal or vegetable protein, such as meat, fish, chicken, and eggs, or beans, lentils, tofu, and grains, such as rice.  Drink plenty of water daily.  Decrease saturated fat in the diet, avoid lots of red meat, processed foods, sweets, fast foods, and fried foods.  Exercise:  Aerobic exercise helps maintain good heart health. At least 30-40 minutes of moderate-intensity exercise is recommended. For example, a brisk walk that increases your heart rate and breathing. This should be done on most days of the week.   Find a type of exercise or a variety of exercises that you enjoy so that it becomes a part of your daily life.  Examples are running, walking, swimming, water aerobics, and biking.  For motivation and support, explore group exercise such as aerobic class, spin class, Zumba, Yoga,or  martial arts, etc.    Set exercise goals for yourself, such as a certain weight goal, walk or run in a race such as a 5k walk/run.  Speak to your primary care provider about exercise goals.  Disease prevention:  If you   smoke or chew tobacco, find out from your caregiver how to quit. It can literally save your life, no matter how long you have been a tobacco user. If you do not use tobacco, never begin.   Maintain a healthy diet and normal weight. Increased weight leads to problems with blood pressure and diabetes.   The Body Mass Index or BMI is a way of measuring how much of your body is fat. Having a  BMI above 27 increases the risk of heart disease, diabetes, hypertension, stroke and other problems related to obesity. Your caregiver can help determine your BMI and based on it develop an exercise and dietary program to help you achieve or maintain this important measurement at a healthful level.  High blood pressure causes heart and blood vessel problems.  Persistent high blood pressure should be treated with medicine if weight loss and exercise do not work.   Fat and cholesterol leaves deposits in your arteries that can block them. This causes heart disease and vessel disease elsewhere in your body.  If your cholesterol is found to be high, or if you have heart disease or certain other medical conditions, then you may need to have your cholesterol monitored frequently and be treated with medication.   Ask if you should have a cardiac stress test if your history suggests this. A stress test is a test done on a treadmill that looks for heart disease. This test can find disease prior to there being a problem.  Menopause can be associated with physical symptoms and risks. Hormone replacement therapy is available to decrease these. You should talk to your caregiver about whether starting or continuing to take hormones is right for you.   Osteoporosis is a disease in which the bones lose minerals and strength as we age. This can result in serious bone fractures. Risk of osteoporosis can be identified using a bone density scan. Women ages 65 and over should discuss this with their caregivers, as should women after menopause who have other risk factors. Ask your caregiver whether you should be taking a calcium supplement and Vitamin D, to reduce the rate of osteoporosis.   Avoid drinking alcohol in excess (more than two drinks per day).  Avoid use of street drugs. Do not share needles with anyone. Ask for professional help if you need assistance or instructions on stopping the use of alcohol, cigarettes,  and/or drugs.  Brush your teeth twice a day with fluoride toothpaste, and floss once a day. Good oral hygiene prevents tooth decay and gum disease. The problems can be painful, unattractive, and can cause other health problems. Visit your dentist for a routine oral and dental check up and preventive care every 6-12 months.   Look at your skin regularly.  Use a mirror to look at your back. Notify your caregivers of changes in moles, especially if there are changes in shapes, colors, a size larger than a pencil eraser, an irregular border, or development of new moles.  Safety:  Use seatbelts 100% of the time, whether driving or as a passenger.  Use safety devices such as hearing protection if you work in environments with loud noise or significant background noise.  Use safety glasses when doing any work that could send debris in to the eyes.  Use a helmet if you ride a bike or motorcycle.  Use appropriate safety gear for contact sports.  Talk to your caregiver about gun safety.  Use sunscreen with a SPF (or skin protection factor)   of 15 or greater.  Lighter skinned people are at a greater risk of skin cancer. Don't forget to also wear sunglasses in order to protect your eyes from too much damaging sunlight. Damaging sunlight can accelerate cataract formation.   Practice safe sex. Use condoms. Condoms are used for birth control and to help reduce the spread of sexually transmitted infections (or STIs).  Some of the STIs are gonorrhea (the clap), chlamydia, syphilis, trichomonas, herpes, HPV (human papilloma virus) and HIV (human immunodeficiency virus) which causes AIDS. The herpes, HIV and HPV are viral illnesses that have no cure. These can result in disability, cancer and death.   Keep carbon monoxide and smoke detectors in your home functioning at all times. Change the batteries every 6 months or use a model that plugs into the wall.   Vaccinations:  Stay up to date with your tetanus shots and  other required immunizations. You should have a booster for tetanus every 10 years. Be sure to get your flu shot every year, since 5%-20% of the [place] population comes down with the flu. The flu vaccine changes each year, so being vaccinated once is not enough. Get your shot in the fall, before the flu season peaks.   Other vaccines to consider:  Human Papilloma Virus or HPV causes cancer of the cervix, and other infections that can be transmitted from person to person. There is a vaccine for HPV, and females should get immunized between the ages of 11 and 26. It requires a series of 3 shots.   Pneumococcal vaccine to protect against certain types of pneumonia.  This is normally recommended for adults age 65 or older.  However, adults younger than 82 years old with certain underlying conditions such as diabetes, heart or lung disease should also receive the vaccine.  Shingles vaccine to protect against Varicella Zoster if you are older than age 60, or younger than 82 years old with certain underlying illness.  If you have not had the Shingrix vaccine, please call your insurer to inquire about coverage for the Shingrix vaccine given in 2 doses.   Some insurers cover this vaccine after age 50, some cover this after age 60.  If your insurer covers this, then call to schedule appointment to have this vaccine here  Hepatitis A vaccine to protect against a form of infection of the liver by a virus acquired from food.  Hepatitis B vaccine to protect against a form of infection of the liver by a virus acquired from blood or body fluids, particularly if you work in health care.  If you plan to travel internationally, check with your local health department for specific vaccination recommendations.  Cancer Screening:  Breast cancer screening is essential to preventive care for women. All women age 20 and older should perform a breast self-exam every month. At age 40 and older, women should have their caregiver  complete a breast exam each year. Women at ages 40 and older should have a mammogram (x-ray film) of the breasts. Your caregiver can discuss how often you need mammograms.    Cervical cancer screening includes taking a Pap smear (sample of cells examined under a microscope) from the cervix (end of the uterus). It also includes testing for HPV (Human Papilloma Virus, which can cause cervical cancer). Screening and a pelvic exam should begin at age 21, or 3 years after a woman becomes sexually active. Screening should occur every year, with a Pap smear but no HPV testing, up to age   30. After age 30, you should have a Pap smear every 3 years with HPV testing, if no HPV was found previously.   Most routine colon cancer screening begins at the age of 50. On a yearly basis, doctors may provide special easy to use take-home tests to check for hidden blood in the stool. Sigmoidoscopy or colonoscopy can detect the earliest forms of colon cancer and is life saving. These tests use a small camera at the end of a tube to directly examine the colon. Speak to your caregiver about this at age 50, when routine screening begins (and is repeated every 5 years unless early forms of pre-cancerous polyps or small growths are found).    

## 2018-07-14 NOTE — Progress Notes (Addendum)
Subjective:    [name] is a 84 y.o. female who presents for Preventative Services visit and chronic medical problems/med check visit.    Primary Care Provider Tysinger, Camelia Eng, PA-C here for primary care  Current Health Care Team:  Dentist, Dr. Darrick Grinder- [place]  Eye doctor, Dr. Gershon Crane- on [address]  [name], cardiology  Medical Services you may have received from other than [facility] providers in the past year (date may be approximate) None  Exercise Current exercise habits: walks at least 4 days per week   Nutrition/Diet Current diet: in general, a "healthy" diet    Depression Screen Depression screen [facility] 2/9 07/14/2018  Decreased Interest 0  Down, Depressed, Hopeless 0  PHQ - 2 Score 0    Activities of Daily Living Screen/Functional Status Survey Is the patient deaf or have difficulty hearing?: No Does the patient have difficulty seeing, even when wearing glasses/contacts?: No Does the patient have difficulty concentrating, remembering, or making decisions?: No Does the patient have difficulty walking or climbing stairs?: No Does the patient have difficulty dressing or bathing?: No Does the patient have difficulty doing errands alone such as visiting a doctor's office or shopping?: No  Can patient draw a clock face showing 3:15 oclock, yes  Fall Risk Screen Fall Risk  07/14/2018 07/11/2017 06/26/2016 06/14/2015  Falls in the past year? No No No No    Gait Assessment: Normal gait observed yes   Past Medical History:  Diagnosis Date  . Allergy   . Anxiety   . Atrophic vaginitis   . Cataract    hx/o surgery, both eyes; Dr. Gershon Crane  . Diverticulosis    per 2010 colonoscopy  . Dyslipidemia   . Full dentures   . GERD (gastroesophageal reflux disease)    hx/o, resolved  . Glaucoma 2016  . H/O echocardiogram 06/22/2010   normal LVEF, moderate mitral and tricuspid regurg 07/2016 [name];  mild aortic valve stenosis, left ventricular normal, EF 55%;  Dr. Gwenlyn Found  06/2010  . History of cardiovascular stress test 06/22/2010   normal bruce myocadial perfusion study, 72% EF; Dr. Gwenlyn Found  . History of mammogram    benign bilat calcifications, heterogeneously dense breasts, stable mammograms 2009-2014  . 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 Marti Sleigh gyencology oncology [place]; Dr. Lorriane Shire gynecology  . Hypertension   . Lactose intolerance   . 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 Rollene Fare  . Osteoporosis   . SVT (supraventricular tachycardia) ([place])    asymptomatic 2014, hx/o SVT, Dr. Rollene Fare  . Vitamin D deficiency     Past Surgical History:  Procedure Laterality Date  . ABDOMINAL HYSTERECTOMY Bilateral    h/o uterine cancer; oophrectomy  . CATARACT EXTRACTION     bilat  . COLONOSCOPY  08/2016   08/2016  Dr. Benson Norway advised no repeat colonoscopy;  scattered diverticla, medium hemorrhoids 2010; Dr. Benson Norway  . NM MYOCAR PERF WALL MOTION    . TOTAL HIP ARTHROPLASTY Bilateral 2008, 2010    twice left (complication on the left, once on the right; Dr. Alphonzo Severance  . TRANSTHORACIC ECHOCARDIOGRAM  02/2013   ordered for SVT; EF 55-65%; calcified MV annulus, mod MR; RSVP increased; RA mildly dilated; mod TR; PA peak pressure 31mmHg    Social History  Socioeconomic History  . Marital status: Married    Spouse name: Not on file  . Number of children: 1  . Years of education: Not on file  . Highest education level: Not on file  Occupational History  . Not on file  Social Needs  . Financial resource strain: Not on file  . Food insecurity:    Worry: Not on file    Inability: Not on file  . Transportation needs:    Medical: Not on file    Non-medical: Not on file  Tobacco Use  . Smoking status: Never Smoker    . Smokeless tobacco: Never Used  Substance and Sexual Activity  . Alcohol use: No  . Drug use: No  . Sexual activity: Never    Comment: walks 3x/wk, retired, married, 2 grandchildren  Lifestyle  . Physical activity:    Days per week: Not on file    Minutes per session: Not on file  . Stress: Not on file  Relationships  . Social connections:    Talks on phone: Not on file    Gets together: Not on file    Attends religious service: Not on file    Active member of club or organization: Not on file    Attends meetings of clubs or organizations: Not on file    Relationship status: Not on file  . Intimate partner violence:    Fear of current or ex partner: Not on file    Emotionally abused: Not on file    Physically abused: Not on file    Forced sexual activity: Not on file  Other Topics Concern  . Not on file  Social History Narrative   Married, 1 son, 2 grandchildren; exercise 4 days per week - walking, mall and [employer].  Husband has hx/o CVA, but is ambulatory.  06/2017    Family History  Family history unknown: Yes     Current Outpatient Medications:  .  alendronate (FOSAMAX) 70 MG tablet, TAKE 1 TABLET BY MOUTH EVERY 7 DAYS WITH A FULL GLASS OF WATER AN EMPTY STOMACH, Disp: 12 tablet, Rfl: 3 .  atenolol (TENORMIN) 50 MG tablet, Take 1 tablet (50 mg total) by mouth daily., Disp: 90 tablet, Rfl: 2 .  calcium carbonate (CALCIUM 600) 600 MG TABS tablet, Take 1 tablet (600 mg total) by mouth 2 (two) times daily with a meal., Disp: 180 tablet, Rfl: 3 .  cetirizine (ZYRTEC) 10 MG tablet, TAKE 1 TABLET (10 MG TOTAL) BY MOUTH AT BEDTIME., Disp: 30 tablet, Rfl: 2 .  latanoprost (XALATAN) 0.005 % ophthalmic solution, 1 drop at bedtime., Disp: , Rfl:  .  lisinopril (PRINIVIL,ZESTRIL) 10 MG tablet, Take 1 tablet (10 mg total) by mouth daily., Disp: 90 tablet, Rfl: 2 .  aspirin 81 MG tablet, Take 1 tablet (81 mg total) by mouth daily., Disp: 90 tablet, Rfl: 3 .  Cholecalciferol (D3 HIGH  POTENCY) 1000 units capsule, Take 1 capsule (1,000 Units total) by mouth daily., Disp: 90 capsule, Rfl: 3 .  simvastatin (ZOCOR) 40 MG tablet, Take 1 tablet (40 mg total) by mouth at bedtime., Disp: 90 tablet, Rfl: 3  No Known Allergies  History reviewed: allergies, current medications, past family history, past medical history, past social history, past surgical history and problem list  Chronic issues discussed: Doesn't want to be on so much BP medication, attributes fatigue to this.   No recent palpation, SOB, edema, syncope or DOE.  Discussed this with [name] last year  She  blames high blood pressure on too much iron in her blood from bad hip prosthesis in the past.  Has had 3 prior hip replacements  Acute issues discussed: none  Objective:    Biometrics BP 120/80   Pulse 66   Ht 5\' 2"  (1.575 m)   Wt 161 lb 6.4 oz (73.2 kg)   BMI 29.52 kg/m   BP Readings from Last 3 Encounters:  07/14/18 120/80  03/31/18 140/80  02/06/18 136/70    Cognitive Testing  Alert? Yes  Normal Appearance?Yes  Oriented to person? Yes  Place? Yes   Time? Yes  Recall of three objects?  Yes  Can perform simple calculations? Yes  Displays appropriate judgment?Yes  Can read the correct time from a watch face?Yes  General appearance: alert, no distress, WD/WN, AA female  Nutritional Status: Inadequate calore intake? no Loss of muscle mass? no Loss of fat beneath skin? no Localized or general edema? no Diminished functional status? no  Other pertinent exam: HEENT: normocephalic, sclerae anicteric, TMs pearly, nares patent, no discharge or erythema, pharynx normal Oral cavity: MMM, no lesions, full dentures Neck: supple, no lymphadenopathy, no thyromegaly, no masses, no bruits Heart: RRR, normal S1, S2, no murmurs Lungs: CTA bilaterally, no wheezes, rhonchi, or rales Abdomen: +bs, soft, non tender, non distended, no masses, no hepatomegaly, no splenomegaly Musculoskeletal: nontender, no  swelling, no obvious deformity Extremities: no edema, no cyanosis, no clubbing Pulses: 2+ symmetric, upper and lower extremities, normal cap refill Neurological: alert, oriented x 3, CN2-12 intact, strength normal upper extremities and lower extremities, sensation normal throughout, DTRs 2+ throughout, no cerebellar signs, gait normal Psychiatric: normal affect, behavior normal, pleasant  Breast/gyn - deferred  Assessment:   Encounter Diagnoses  Name Primary?  . Essential hypertension Yes  . Medicare annual wellness visit, subsequent   . Need for shingles vaccine   . Vitamin D deficiency   . Dyslipidemia   . Osteoporosis, unspecified osteoporosis type, unspecified pathological fracture presence   . Renal insufficiency   . Allergic rhinitis, unspecified seasonality, unspecified trigger   . Varicose veins of lower extremity, unspecified laterality, unspecified whether complicated   . Tricuspid valve insufficiency, unspecified etiology   . Mitral valve insufficiency, unspecified etiology   . Lown Ganong Levine syndrome   . 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 Glaucoma, unspecified glaucoma type, unspecified laterality   . Vaccine counseling   . Iron disorder   . Other fatigue   . Elevated ferritin   . Aortic atherosclerosis ([place])      Plan:   A preventative services visit was completed today.  During the course of the visit today, we discussed and counseled about appropriate screening and preventive services.  A health risk assessment was established today that included a review of current medications, allergies, social history, family history, medical and preventative health history, biometrics, and preventative screenings to identify potential safety concerns or impairments.  A personalized plan was printed today for your records and use.   Personalized health advice and education was given today to reduce health risks and promote self management and wellness.   Information regarding end of life planning was discussed today.  Conditions/risks identified: None, doing well.  Chronic problems discussed today: HTN - she will discuss medications with [name] regarding fatigue.    She will monitor BPs and f/u with [name].   Sees cardiology soon  AVNRT/arrhythmia- history of, reviewed 07/2017 cardiology notes.  No recurrent tachycardia or arrhythmia in the  last year.  Follow-up with cardiology soon  Aortic atherosclerosis noted on prior chest x-ray.  On statin, continue exercise, follow-up with cardiology yearly  Of note, there was some prior notation of congestive heart failure in remote past, but there is no evidence of this at this time  Routine labs today  C/t current medications in general.   Acute problems discussed today: none  Recommendations:  I recommend a yearly ophthalmology/optometry visit for glaucoma screening and eye checkup  I recommended a yearly dental visit for hygiene and checkup  Advanced directives - discussed nature and purpose of Advanced Directives, encouraged them to complete them if they have not done so and/or encouraged them to get [place] a copy if they have done this already.  I recommend a screening mammogram every 1-2 years   Referrals today: Consider Cologuard  Immunizations: I recommended a yearly influenza vaccine, typically in September when the vaccine is usually available Is the Pneumococcal vaccine up to date: yes. Is the Shingles vaccine up to date: no.  Declines due to cost of the vaccine Is the Td/Tdap vaccine up to date: yes.  Rosaleen was seen today for fasting cpe.  Diagnoses and all orders for this visit:  Essential hypertension -     Comprehensive metabolic panel -     CBC with Differential/Platelet -     Lipid panel -     Iron and TIBC -     Ferritin -     TSH  Medicare annual wellness visit, subsequent  Need for shingles vaccine  Vitamin D deficiency  Dyslipidemia -      Comprehensive metabolic panel -     Lipid panel  Osteoporosis, unspecified osteoporosis type, unspecified pathological fracture presence  Renal insufficiency -     Comprehensive metabolic panel  Allergic rhinitis, unspecified seasonality, unspecified trigger  Varicose veins of lower extremity, unspecified laterality, unspecified whether complicated  Tricuspid valve insufficiency, unspecified etiology  Mitral valve insufficiency, unspecified etiology  Lown Ganong Levine syndrome  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  During the course of the visit the patient was educated and counseled about appropriate screening and preventive services.  A health risk assessment was established with the patient that included a review of current medications, allergies, social history, family history, medical and preventative health history, biometrics, and preventative screenings to identify potential safety concerns or impairments.  A personalized plan was printed today for the patient's records and use.   Personalized health advice and education was given today to reduce health risks and promote self management and wellness.  Information regarding end of life planning was discussed today.  Dorothea Ogle, PA-C   08/04/2018

## 2018-07-15 ENCOUNTER — Other Ambulatory Visit: Payer: Self-pay | Admitting: Medical

## 2018-07-15 LAB — COMPREHENSIVE METABOLIC PANEL
ALT: 18 IU/L (ref 0–32)
AST: 27 IU/L (ref 0–40)
Albumin/Globulin Ratio: 1.2 (ref 1.2–2.2)
Albumin: 4.3 g/dL (ref 3.5–4.7)
Alkaline Phosphatase: 66 IU/L (ref 39–117)
BUN/Creatinine Ratio: 12 (ref 12–28)
BUN: 12 mg/dL (ref 8–27)
Bilirubin Total: 0.8 mg/dL (ref 0.0–1.2)
CALCIUM: 9.6 mg/dL (ref 8.7–10.3)
CO2: 25 mmol/L (ref 20–29)
CREATININE: 0.99 mg/dL (ref 0.57–1.00)
Chloride: 102 mmol/L (ref 96–106)
GFR calc Af Amer: 61 mL/min/{1.73_m2} (ref >59)
GFR, EST NON AFRICAN AMERICAN: 53 mL/min/{1.73_m2} — AB (ref >59)
GLUCOSE: 90 mg/dL (ref 65–99)
Globulin, Total: 3.7 g/dL (ref 1.5–4.5)
POTASSIUM: 4.7 mmol/L (ref 3.5–5.2)
Sodium: 141 mmol/L (ref 134–144)
Total Protein: 8 g/dL (ref 6.0–8.5)

## 2018-07-15 LAB — IRON AND TIBC
IRON SATURATION: 39 % (ref 15–55)
Iron: 115 ug/dL (ref 27–139)
Total Iron Binding Capacity: 294 ug/dL (ref 250–450)
UIBC: 179 ug/dL (ref 118–369)

## 2018-07-15 LAB — CBC WITH DIFFERENTIAL/PLATELET
Basophils Absolute: 0 10*3/uL (ref 0.0–0.2)
Basos: 0 %
EOS (ABSOLUTE): 0.1 10*3/uL (ref 0.0–0.4)
EOS: 2 %
Hematocrit: 43.1 % (ref 34.0–46.6)
Hemoglobin: 14.6 g/dL (ref 11.1–15.9)
IMMATURE GRANS (ABS): 0 10*3/uL (ref 0.0–0.1)
IMMATURE GRANULOCYTES: 0 %
LYMPHS: 42 %
Lymphocytes Absolute: 1.3 10*3/uL (ref 0.7–3.1)
MCH: 30.1 pg (ref 26.6–33.0)
MCHC: 33.9 g/dL (ref 31.5–35.7)
MCV: 89 fL (ref 79–97)
MONOCYTES: 10 %
Monocytes Absolute: 0.3 10*3/uL (ref 0.1–0.9)
NEUTROS PCT: 46 %
Neutrophils Absolute: 1.4 10*3/uL (ref 1.4–7.0)
PLATELETS: 200 10*3/uL (ref 150–450)
RBC: 4.85 x10E6/uL (ref 3.77–5.28)
RDW: 14.1 % (ref 12.3–15.4)
WBC: 3.2 10*3/uL — ABNORMAL LOW (ref 3.4–10.8)

## 2018-07-15 LAB — LIPID PANEL
Chol/HDL Ratio: 3.3 ratio (ref 0.0–4.4)
Cholesterol, Total: 143 mg/dL (ref 100–199)
HDL: 43 mg/dL (ref >39)
LDL Calculated: 72 mg/dL (ref 0–99)
TRIGLYCERIDES: 140 mg/dL (ref 0–149)
VLDL CHOLESTEROL CAL: 28 mg/dL (ref 5–40)

## 2018-07-15 LAB — FERRITIN: Ferritin: 281 ng/mL — ABNORMAL HIGH (ref 15–150)

## 2018-07-15 LAB — TSH: TSH: 2.69 u[IU]/mL (ref 0.450–4.500)

## 2018-07-15 MED ORDER — CHOLECALCIFEROL 25 MCG (1000 UT) PO CAPS: 1000.0000 [IU] | ORAL_CAPSULE | Freq: Every day | ORAL | 3 refills | Status: DC

## 2018-07-15 MED ORDER — ASPIRIN 81 MG PO TABS: 81.0000 mg | ORAL_TABLET | Freq: Every day | ORAL | 3 refills | Status: DC

## 2018-07-15 MED ORDER — SIMVASTATIN 40 MG PO TABS: 40.0000 mg | ORAL_TABLET | Freq: Every day | ORAL | 3 refills | Status: DC

## 2018-07-15 NOTE — Addendum Note (Signed)
Addended by: Gwinda Maine on: 07/15/2018 04:46 PM   Modules accepted: Orders

## 2018-07-18 ENCOUNTER — Telehealth: Payer: Self-pay | Admitting: Hematology

## 2018-07-18 ENCOUNTER — Encounter: Payer: Self-pay | Admitting: Hematology

## 2018-07-18 NOTE — Telephone Encounter (Signed)
New referral received from Dr. Glade Lloyd for elevated ferritin. Pt has been scheduled to see Dr. Irene Limbo on 8/26 at 10am. PT aware to arrive 30 minutes early. Letter mailed.

## 2018-07-21 ENCOUNTER — Other Ambulatory Visit: Payer: Self-pay

## 2018-07-21 ENCOUNTER — Telehealth: Payer: Self-pay

## 2018-07-21 DIAGNOSIS — Z1212 Encounter for screening for malignant neoplasm of rectum: Secondary | ICD-10-CM

## 2018-07-21 NOTE — Telephone Encounter (Signed)
cologuard ordered and patient notified that she will receive it by mail.

## 2018-07-21 NOTE — Telephone Encounter (Signed)
Pt stated she is ok with having Cologuard. Please place order and advise patient further.

## 2018-08-04 DIAGNOSIS — I700 Atherosclerosis of aorta: Secondary | ICD-10-CM | POA: Insufficient documentation

## 2018-08-04 DIAGNOSIS — R7989 Other specified abnormal findings of blood chemistry: Secondary | ICD-10-CM | POA: Insufficient documentation

## 2018-08-11 ENCOUNTER — Inpatient Hospital Stay: Payer: Medicare Other

## 2018-08-11 ENCOUNTER — Inpatient Hospital Stay: Payer: Medicare Other | Attending: Hematology | Admitting: Hematology

## 2018-08-11 VITALS — BP 170/79 | HR 75 | Temp 98.2°F | Resp 18 | Ht 62.0 in | Wt 161.7 lb

## 2018-08-11 DIAGNOSIS — Z7982 Long term (current) use of aspirin: Secondary | ICD-10-CM | POA: Insufficient documentation

## 2018-08-11 DIAGNOSIS — R7989 Other specified abnormal findings of blood chemistry: Secondary | ICD-10-CM | POA: Diagnosis present

## 2018-08-11 DIAGNOSIS — Z79899 Other long term (current) drug therapy: Secondary | ICD-10-CM | POA: Diagnosis not present

## 2018-08-11 DIAGNOSIS — I10 Essential (primary) hypertension: Secondary | ICD-10-CM | POA: Diagnosis not present

## 2018-08-11 LAB — CBC WITH DIFFERENTIAL/PLATELET
Basophils Absolute: 0 10*3/uL (ref 0.0–0.1)
Basophils Relative: 0 %
EOS PCT: 1 %
Eosinophils Absolute: 0.1 10*3/uL (ref 0.0–0.5)
HEMATOCRIT: 43.7 % (ref 34.8–46.6)
Hemoglobin: 14.3 g/dL (ref 11.6–15.9)
LYMPHS ABS: 1.9 10*3/uL (ref 0.9–3.3)
LYMPHS PCT: 41 %
MCH: 29.4 pg (ref 25.1–34.0)
MCHC: 32.7 g/dL (ref 31.5–36.0)
MCV: 89.9 fL (ref 79.5–101.0)
MONO ABS: 0.4 10*3/uL (ref 0.1–0.9)
Monocytes Relative: 9 %
NEUTROS ABS: 2.2 10*3/uL (ref 1.5–6.5)
Neutrophils Relative %: 49 %
PLATELETS: 233 10*3/uL (ref 145–400)
RBC: 4.86 MIL/uL (ref 3.70–5.45)
RDW: 14.3 % (ref 11.2–14.5)
WBC: 4.5 10*3/uL (ref 3.9–10.3)

## 2018-08-11 LAB — CMP (CANCER CENTER ONLY)
ALBUMIN: 4 g/dL (ref 3.5–5.0)
ALT: 26 U/L (ref 0–44)
AST: 33 U/L (ref 15–41)
Alkaline Phosphatase: 70 U/L (ref 38–126)
Anion gap: 9 (ref 5–15)
BUN: 14 mg/dL (ref 8–23)
CHLORIDE: 105 mmol/L (ref 98–111)
CO2: 29 mmol/L (ref 22–32)
Calcium: 10.5 mg/dL — ABNORMAL HIGH (ref 8.9–10.3)
Creatinine: 1.07 mg/dL — ABNORMAL HIGH (ref 0.44–1.00)
GFR, Est AFR Am: 54 mL/min — ABNORMAL LOW (ref >60)
GFR, Estimated: 47 mL/min — ABNORMAL LOW (ref >60)
GLUCOSE: 94 mg/dL (ref 70–99)
POTASSIUM: 5 mmol/L (ref 3.5–5.1)
Sodium: 143 mmol/L (ref 135–145)
Total Bilirubin: 0.9 mg/dL (ref 0.3–1.2)
Total Protein: 8.9 g/dL — ABNORMAL HIGH (ref 6.5–8.1)

## 2018-08-11 LAB — IRON AND TIBC
Iron: 91 ug/dL (ref 41–142)
SATURATION RATIOS: 28 % (ref 21–57)
TIBC: 320 ug/dL (ref 236–444)
UIBC: 229 ug/dL

## 2018-08-11 LAB — SEDIMENTATION RATE: Sed Rate: 24 mm/hr — ABNORMAL HIGH (ref 0–22)

## 2018-08-11 LAB — FERRITIN: Ferritin: 204 ng/mL (ref 11–307)

## 2018-08-11 NOTE — Progress Notes (Signed)
HEMATOLOGY/ONCOLOGY CONSULTATION NOTE  Date of Service: 08/11/2018  Patient Care Team: Tysinger, Camelia Eng, PA-C as PCP - General (Family Medicine)  CHIEF COMPLAINTS/PURPOSE OF CONSULTATION:  Elevated ferritin   HISTORY OF PRESENTING ILLNESS:   [name] is a wonderful 84 y.o. female who has been referred to [place] by Chana Bode, PA for evaluation and management of Elevated ferritin. The pt reports that she is doing well overall.   The pt reports that she has had three hip surgeries, first hip surgery right hip 10 years ago for arthritis. She notes that one of her hip replacements was found to be defective and was subsequently replaced. She notes that she has developed some more fatigue recently and was evaluated for this.   She notes that she does not take Iron replacements and denies cooking in a cast iron skillet regularly. She denies consuming ETOH whatsoever. The pt also notes that she has never had to receive a blood transfusion.   The pt notes that she walks at her local gym every day and makes a point to get out of her house every day.   Most recent lab results (07/14/18) of CBC w/diff and CMP is as follows: all values are WNL except for WBC at 3.2k. Ferritin 07/14/18 was at 281 Iron/TIBC 07/14/18 showed all values WNL.   On review of systems, pt reports some fatigue, staying active, and denies new bone pains, abdominal pains, leg swelling, and any other symptoms.   On Social Hx the pt denies smoking or drinking ETOH.    MEDICAL HISTORY:  Past Medical History:  Diagnosis Date  . Allergy   . Anxiety   . Atrophic vaginitis   . Cataract    hx/o surgery, both eyes; Dr. Gershon Crane  . Diverticulosis    per 2010 colonoscopy  . Dyslipidemia   . Full dentures   . GERD (gastroesophageal reflux disease)    hx/o, resolved  . Glaucoma 2016  . H/O echocardiogram 06/22/2010   normal LVEF, moderate mitral and tricuspid regurg 07/2016 [name];  mild aortic valve stenosis,  left ventricular normal, EF 55%; Dr. Gwenlyn Found  06/2010  . History of cardiovascular stress test 06/22/2010   normal bruce myocadial perfusion study, 72% EF; Dr. Gwenlyn Found  . History of mammogram    benign bilat calcifications, heterogeneously dense breasts, stable mammograms 2009-2014  . 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 Marti Sleigh gyencology oncology [place]; Dr. Lorriane Shire gynecology  . Hypertension   . Lactose intolerance   . 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 Rollene Fare  . Osteoporosis   . SVT (supraventricular tachycardia) ([place])    asymptomatic 2014, hx/o SVT, Dr. Rollene Fare  . Vitamin D deficiency     SURGICAL HISTORY: Past Surgical History:  Procedure Laterality Date  . ABDOMINAL HYSTERECTOMY Bilateral    h/o uterine cancer; oophrectomy  . CATARACT EXTRACTION     bilat  . COLONOSCOPY  08/2016   08/2016  Dr. Benson Norway advised no repeat colonoscopy;  scattered diverticla, medium hemorrhoids 2010; Dr. Benson Norway  . NM MYOCAR PERF WALL MOTION    . TOTAL HIP ARTHROPLASTY Bilateral 2008, 2010    twice left (complication on the left, once on the right; Dr. Alphonzo Severance  . TRANSTHORACIC ECHOCARDIOGRAM  02/2013  ordered for SVT; EF 55-65%; calcified MV annulus, mod MR; RSVP increased; RA mildly dilated; mod TR; PA peak pressure 67mmHg    SOCIAL HISTORY: Social History   Socioeconomic History  . Marital status: Married    Spouse name: Not on file  . Number of children: 1  . Years of education: Not on file  . Highest education level: Not on file  Occupational History  . Not on file  Social Needs  . Financial resource strain: Not on file  . Food insecurity:    Worry: Not on file    Inability: Not on file  . Transportation needs:    Medical: Not on file     Non-medical: Not on file  Tobacco Use  . Smoking status: Never Smoker  . Smokeless tobacco: Never Used  Substance and Sexual Activity  . Alcohol use: No  . Drug use: No  . Sexual activity: Never    Comment: walks 3x/wk, retired, married, 2 grandchildren  Lifestyle  . Physical activity:    Days per week: Not on file    Minutes per session: Not on file  . Stress: Not on file  Relationships  . Social connections:    Talks on phone: Not on file    Gets together: Not on file    Attends religious service: Not on file    Active member of club or organization: Not on file    Attends meetings of clubs or organizations: Not on file    Relationship status: Not on file  . Intimate partner violence:    Fear of current or ex partner: Not on file    Emotionally abused: Not on file    Physically abused: Not on file    Forced sexual activity: Not on file  Other Topics Concern  . Not on file  Social History Narrative   Married, 1 son, 2 grandchildren; exercise 4 days per week - walking, mall and [employer].  Husband has hx/o CVA, but is ambulatory.  06/2017    FAMILY HISTORY: Family History  Family history unknown: Yes    ALLERGIES:  has No Known Allergies.  MEDICATIONS:  Current Outpatient Medications  Medication Sig Dispense Refill  . alendronate (FOSAMAX) 70 MG tablet TAKE 1 TABLET BY MOUTH EVERY 7 DAYS WITH A FULL GLASS OF WATER AN EMPTY STOMACH 12 tablet 3  . aspirin 81 MG tablet Take 1 tablet (81 mg total) by mouth daily. 90 tablet 3  . atenolol (TENORMIN) 50 MG tablet Take 1 tablet (50 mg total) by mouth daily. 90 tablet 2  . calcium carbonate (CALCIUM 600) 600 MG TABS tablet Take 1 tablet (600 mg total) by mouth 2 (two) times daily with a meal. 180 tablet 3  . cetirizine (ZYRTEC) 10 MG tablet TAKE 1 TABLET (10 MG TOTAL) BY MOUTH AT BEDTIME. 30 tablet 2  . Cholecalciferol (D3 HIGH POTENCY) 1000 units capsule Take 1 capsule (1,000 Units total) by mouth daily. 90 capsule 3  . latanoprost  (XALATAN) 0.005 % ophthalmic solution 1 drop at bedtime.    Marland Kitchen lisinopril (PRINIVIL,ZESTRIL) 10 MG tablet Take 1 tablet (10 mg total) by mouth daily. 90 tablet 2  . simvastatin (ZOCOR) 40 MG tablet Take 1 tablet (40 mg total) by mouth at bedtime. 90 tablet 3   No current facility-administered medications for this visit.     REVIEW OF SYSTEMS:    10 Point review of Systems was done is negative except as noted above.  PHYSICAL EXAMINATION:  .  Vitals:   08/11/18 1013  BP: (!) 170/79  Pulse: 75  Resp: 18  Temp: 98.2 F (36.8 C)  SpO2: 99%   Filed Weights   08/11/18 1013  Weight: 161 lb 11.2 oz (73.3 kg)   .Body mass index is 29.58 kg/m.  GENERAL:alert, in no acute distress and comfortable SKIN: no acute rashes, no significant lesions EYES: conjunctiva are pink and non-injected, sclera anicteric OROPHARYNX: MMM, no exudates, no oropharyngeal erythema or ulceration NECK: supple, no JVD LYMPH:  no palpable lymphadenopathy in the cervical, axillary or inguinal regions LUNGS: clear to auscultation b/l with normal respiratory effort HEART: regular rate & rhythm ABDOMEN:  normoactive bowel sounds , non tender, not distended. Extremity: no pedal edema PSYCH: alert & oriented x 3 with fluent speech NEURO: no focal motor/sensory deficits  LABORATORY DATA:  I have reviewed the data as listed  . CBC Latest Ref Rng & Units 08/11/2018 07/14/2018 07/11/2017  WBC 3.9 - 10.3 K/uL 4.5 3.2(L) 3.8(L)  Hemoglobin 11.6 - 15.9 g/dL 14.3 14.6 14.4  Hematocrit 34.8 - 46.6 % 43.7 43.1 43.4  Platelets 145 - 400 K/uL 233 200 205    . CMP Latest Ref Rng & Units 07/14/2018 07/11/2017 06/26/2016  Glucose 65 - 99 mg/dL 90 80 84  BUN 8 - 27 mg/dL 12 16 18   Creatinine 0.57 - 1.00 mg/dL 0.99 1.07(H) 1.08(H)  Sodium 134 - 144 mmol/L 141 139 141  Potassium 3.5 - 5.2 mmol/L 4.7 4.7 5.2  Chloride 96 - 106 mmol/L 102 104 104  CO2 20 - 29 mmol/L 25 23 26   Calcium 8.7 - 10.3 mg/dL 9.6 9.5 9.4  Total  Protein 6.0 - 8.5 g/dL 8.0 7.6 7.7  Total Bilirubin 0.0 - 1.2 mg/dL 0.8 0.9 0.8  Alkaline Phos 39 - 117 IU/L 66 62 57  AST 0 - 40 IU/L 27 25 28   ALT 0 - 32 IU/L 18 18 17      RADIOGRAPHIC STUDIES: I have personally reviewed the radiological images as listed and agreed with the findings in the report. No results found.  ASSESSMENT & PLAN:  84 y.o. female with  1. Elevated ferritin  PLAN -Discussed patient's most recent labs from 07/14/18, Ferrtin was at 281, no anemia, normal Iron/TIBC values. -At patient's age and ferritin level, genetic abnormality is not likely -Discussed that a Ferritin level of 281 is not worrisome for organ injury -Recommend that the pt continue to avoid cooking in a cast iron skillet, iron supplements and alcohol -Will collect blood tests today -Will evaluate ferritin and iron levels again in 6 months, if all is stable, will discharge pt back to her PCP    Labs today RTC with Dr Irene Limbo with labs in 6 months   All of the patients questions were answered with apparent satisfaction. The patient knows to call the clinic with any problems, questions or concerns.  The total time spent in the appt was 30 minutes and more than 50% was on counseling and direct patient cares.    Sullivan Lone MD MS AAHIVMS [facility] Hematology/Oncology Physician [facility]  (Office):       [phone] (Work cell):  [phone] (Fax):           [phone]  08/11/2018 10:56 AM  I, Baldwin [place], am acting as a scribe for Dr. Irene Limbo  .I have reviewed the above documentation for accuracy and completeness, and I agree with the above. Brunetta Genera MD

## 2018-08-13 ENCOUNTER — Other Ambulatory Visit: Payer: Self-pay

## 2018-08-13 DIAGNOSIS — M545 Low back pain, unspecified: Secondary | ICD-10-CM

## 2018-08-13 DIAGNOSIS — Z1212 Encounter for screening for malignant neoplasm of rectum: Secondary | ICD-10-CM

## 2018-08-14 LAB — HEMOCHROMATOSIS DNA-PCR(C282Y,H63D)

## 2018-08-21 ENCOUNTER — Encounter: Payer: Self-pay | Admitting: Medical

## 2018-08-24 ENCOUNTER — Other Ambulatory Visit: Payer: Self-pay | Admitting: Medical

## 2018-09-05 ENCOUNTER — Telehealth: Payer: Self-pay | Admitting: Medical

## 2018-09-05 ENCOUNTER — Ambulatory Visit: Payer: Medicare Other | Admitting: Internal Medicine

## 2018-09-05 ENCOUNTER — Encounter: Payer: Self-pay | Admitting: Internal Medicine

## 2018-09-05 VITALS — BP 118/78 | HR 60 | Ht 62.0 in | Wt 161.0 lb

## 2018-09-05 DIAGNOSIS — I10 Essential (primary) hypertension: Secondary | ICD-10-CM | POA: Diagnosis not present

## 2018-09-05 DIAGNOSIS — I456 Pre-excitation syndrome: Secondary | ICD-10-CM | POA: Diagnosis not present

## 2018-09-05 DIAGNOSIS — R011 Cardiac murmur, unspecified: Secondary | ICD-10-CM | POA: Diagnosis not present

## 2018-09-05 LAB — COLOGUARD: Cologuard: NEGATIVE

## 2018-09-05 NOTE — Progress Notes (Signed)
OFFICE NOTE  Chief Complaint:  No complaints  Primary Care Physician: Carlena Hurl, PA-C  HPI:  [name] is a pleasant 84 year old female previously followed by Dr. Rollene Fare. She's here today to establish cardiac care with me. She has history of hypertension, remote bilateral hip placements and redo hip surgery for failed prosthesis in 2011. 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 She was previously on flecainide but has been taken off of that. She's had no recurrence of her palpitations on beta blockers. She had a negative Myoview for ischemia in 2011 an echocardiogram in 2014 which showed normal systolic function and moderate mitral regurgitation.  She has no complaints today.  The pleasure see Ms. [name] back in the office today. She is without any complaints. She continues to be active and denies any worsening shortness of breath or chest pain. She's had no further recurrent palpitations. Heart rate seems well controlled on atenolol. Recently she's had some issues with lower blood pressure and her lisinopril HCTZ was changed to lisinopril. This is associated with a small increase in blood pressure to the normal range. She now feels well again. Should be noted she had an echocardiogram in 2014 which showed moderate MR and moderate TR.  08/09/2016  [name] returns today for follow-up. She continues to be without complaints. She walks regularly and has no shortness of breath or worsening chest pain. Blood pressure is well-controlled today. She just had a physical which was unremarkable. Cholesterol has been well controlled -recent total cholesterol 146, HDL 42, LDL 74 and triglycerides 149. She just had a repeat echocardiogram which shows persistent moderate MR and TR with an EF of 65%. This appears to be unchanged compared to her prior echo in 2014. There is very mild pulmonary hypertension but she is asymptomatic with  this.  08/13/2017  [name] was seen today follow-up. She denies any recurrent SVT. There is no chest pain or worsening shortness of breath. She says she walks regularly. She had a recent cholesterol profile which is very stable showing total cholesterol is above 150 and LDL 79. She's had normal LV function by echo within the past several years and moderate MR and TR. She is asymptomatic. I don't anticipate any valve surgery that will be necessary.  09/05/2018  [name] returns today for routine follow-up.  She denies any recurrent SVT.  She denies chest pain or shortness of breath.  Cholesterol is been well controlled with LDL 72.  Blood pressure is at goal.  She was noted to have moderate MR and TR by echo in 2017.  Her murmurs are stable.  She denies any new heart failure symptoms.  PMHx:  Past Medical History:  Diagnosis Date  . Allergy   . Anxiety   . Atrophic vaginitis   . Cataract    hx/o surgery, both eyes; Dr. Gershon Crane  . Diverticulosis    per 2010 colonoscopy  . Dyslipidemia   . Full dentures   . GERD (gastroesophageal reflux disease)    hx/o, resolved  . Glaucoma 2016  . H/O echocardiogram 06/22/2010   normal LVEF, moderate mitral and tricuspid regurg 07/2016 [name];  mild aortic valve stenosis, left ventricular normal, EF 55%; Dr. Gwenlyn Found  06/2010  . History of cardiovascular stress test 06/22/2010   normal bruce myocadial perfusion study, 72% EF; Dr. Gwenlyn Found  . History of mammogram    benign bilat calcifications, heterogeneously dense breasts,  stable mammograms 2009-2014  . 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 Marti Sleigh gyencology oncology [place]; Dr. Lorriane Shire gynecology  . Hypertension   . Lactose intolerance   . 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 Rollene Fare  . Osteoporosis   . SVT (supraventricular tachycardia) ([place])    asymptomatic 2014, hx/o SVT, Dr. Rollene Fare  . Vitamin D deficiency     Past Surgical History:  Procedure Laterality Date  . ABDOMINAL HYSTERECTOMY Bilateral    h/o uterine cancer; oophrectomy  . CATARACT EXTRACTION     bilat  . COLONOSCOPY  08/2016   08/2016  Dr. Benson Norway advised no repeat colonoscopy;  scattered diverticla, medium hemorrhoids 2010; Dr. Benson Norway  . NM MYOCAR PERF WALL MOTION    . TOTAL HIP ARTHROPLASTY Bilateral 2008, 2010    twice left (complication on the left, once on the right; Dr. Alphonzo Severance  . TRANSTHORACIC ECHOCARDIOGRAM  02/2013   ordered for SVT; EF 55-65%; calcified MV annulus, mod MR; RSVP increased; RA mildly dilated; mod TR; PA peak pressure 89mmHg    FAMHx:  Family History  Family history unknown: Yes    SOCHx:   reports that she has never smoked. She has never used smokeless tobacco. She reports that she does not drink alcohol or use drugs.  ALLERGIES:  No Known Allergies  ROS: Pertinent items noted in HPI and remainder of comprehensive ROS otherwise negative.  HOME MEDS: Current Outpatient Medications  Medication Sig Dispense Refill  . alendronate (FOSAMAX) 70 MG tablet TAKE 1 TABLET BY MOUTH EVERY 7 DAYS WITH A FULL GLASS OF WATER AN EMPTY STOMACH 12 tablet 3  . aspirin 81 MG tablet Take 1 tablet (81 mg total) by mouth daily. 90 tablet 3  . atenolol (TENORMIN) 50 MG tablet Take 1 tablet (50 mg total) by mouth daily. 90 tablet 2  . calcium carbonate (CALCIUM 600) 600 MG TABS tablet Take 1 tablet (600 mg total) by mouth 2 (two) times daily with a meal. 180 tablet 3  . cetirizine (ZYRTEC) 10 MG tablet TAKE 1 TABLET (10 MG TOTAL) BY MOUTH AT BEDTIME. 30 tablet 2  . Cholecalciferol (D3 HIGH POTENCY) 1000 units capsule Take 1 capsule (1,000 Units total) by mouth daily. 90 capsule 3  . latanoprost (XALATAN) 0.005 % ophthalmic solution 1 drop at bedtime.    Marland Kitchen  lisinopril (PRINIVIL,ZESTRIL) 10 MG tablet Take 1 tablet (10 mg total) by mouth daily. 90 tablet 2  . simvastatin (ZOCOR) 40 MG tablet Take 1 tablet (40 mg total) by mouth at bedtime. 90 tablet 3  . simvastatin (ZOCOR) 40 MG tablet TAKE 1 TABLET BY MOUTH AT BEDTIME. 90 tablet 0   No current facility-administered medications for this visit.     LABS/IMAGING: No results found for this or any previous visit (from the past 48 hour(s)). No results found.  VITALS: BP 118/78 (BP Location: Left Arm, Patient Position: Sitting, Cuff Size: Normal)   Pulse 60   Ht 5\' 2"  (1.575 m)   Wt 161 lb (73 kg)   BMI 29.45 kg/m   EXAM: General appearance: alert and no distress Neck: no carotid bruit, no JVD and thyroid not enlarged, symmetric, no tenderness/mass/nodules Lungs: clear to auscultation bilaterally Heart: regular rate and rhythm, S1, S2 normal, systolic murmur:  holosystolic 3/6, blowing at apex and 2nd systolic murmur: early systolic 2/6, crescendo at 2nd right intercostal space Abdomen: soft, non-tender; bowel sounds normal; no masses,  no organomegaly Extremities: extremities normal, atraumatic, no cyanosis or edema Pulses: 2+ and symmetric Skin: Skin color, texture, turgor normal. 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 Hypertension 4. Dyslipidemia 5. Moderate MR and moderate TR (2017), LVEF 65%  PLAN: 1.   [name] continues to do well without any worsening palpitations or SVT.  Blood pressure is well controlled.  Cholesterol is at goal with LDL 72.  Weight is been stable and she denies any worsening shortness of breath or heart failure symptoms related to her MR and TR.  She will need a repeat echo next year which will be 3 years from her previous study, approximately 1 month prior to follow-up and will discuss that study at  follow-up.  Follow-up annually or sooner as necessary.  Pixie Casino, MD, [facility], Colon Director of the [facility] Lip[facility]  Cardiovascular [facility] Diplomate of the American Board of Clinical Lipidology Attending Cardiologist  Direct Dial: [phone]  Fax: [phone]  Website:  [email or web address] 09/05/2018, 9:31 AM

## 2018-09-05 NOTE — Telephone Encounter (Signed)
Please let them know that the Cologuard screening for colon cancer was negative.  This indicates a lower likelihood that colorectal cancer is present.   Lets plan to repeat this in 3 years.  However, if the develop bowel changes, blood in stool, unexpected weight loss, or other new bowel changes, then recheck. 

## 2018-09-05 NOTE — Telephone Encounter (Signed)
Patient notified

## 2018-09-05 NOTE — Patient Instructions (Signed)
Your physician has requested that you have an echocardiogram. Echocardiography is a painless test that uses sound waves to create images of your heart. It provides your doctor with information about the size and shape of your heart and how well your heart's chambers and valves are working. This procedure takes approximately one hour. There are no restrictions for this procedure. -- due in 1 year -- done at [address] wants you to follow-up in: ONE YEAR with [name] (after echo). You will receive a reminder letter in the mail two months in advance. If you don't receive a letter, please call our office to schedule the follow-up appointment.

## 2018-09-25 ENCOUNTER — Telehealth (INDEPENDENT_AMBULATORY_CARE_PROVIDER_SITE_OTHER): Payer: Self-pay

## 2018-09-25 ENCOUNTER — Encounter (INDEPENDENT_AMBULATORY_CARE_PROVIDER_SITE_OTHER): Payer: Self-pay | Admitting: Orthopedic Surgery

## 2018-09-25 ENCOUNTER — Ambulatory Visit (INDEPENDENT_AMBULATORY_CARE_PROVIDER_SITE_OTHER): Payer: Medicare Other | Admitting: Orthopedic Surgery

## 2018-09-25 DIAGNOSIS — R0989 Other specified symptoms and signs involving the circulatory and respiratory systems: Secondary | ICD-10-CM | POA: Diagnosis not present

## 2018-09-25 MED ORDER — AMOXICILLIN 500 MG PO TABS: ORAL_TABLET | ORAL | 0 refills | Status: DC

## 2018-09-25 MED ORDER — ACETAMINOPHEN-CODEINE #3 300-30 MG PO TABS: ORAL_TABLET | ORAL | 0 refills | Status: DC

## 2018-09-25 NOTE — Telephone Encounter (Signed)
Order put in for bilateral ABI's for this patient.

## 2018-09-25 NOTE — Progress Notes (Signed)
Office Visit Note   Patient: [name]           Date of Birth: 06-20-1933           MRN: [id number] Visit Date: 09/25/2018 Requested by: Carlena Hurl, PA-C [id number] [place], [place] [address] PCP: Carlena Hurl, PA-C  Subjective: Chief Complaint  Patient presents with  . Right Leg - Pain    HPI: [name] is a patient with right leg pain.  The pain is primarily in her calf region.  Denies any left leg symptoms.  Is not necessarily worse with ambulation but she does report pain.  She has some pain at rest as well.  Denies any back pain denies any numbness and tingling and denies any groin pain.              ROS: All systems reviewed are negative as they relate to the chief complaint within the history of present illness.  Patient denies  fevers or chills.   Assessment & Plan: Visit Diagnoses:  1. Decreased pulse     Plan: Impression is right calf pain with diminished pulses bilaterally.  Hip range of motion nontender.  No paresthesias in either leg.  No nerve retention signs.  I think she may have claudication.  Plan is ABIs and Tylenol 3 prescribed 1 time prescription.  See her back after her bilateral ABIs.  Amoxicillin also prescribed for dental visit.  Follow-Up Instructions: No follow-ups on file.   Orders:  No orders of the defined types were placed in this encounter.  Meds ordered this encounter  Medications  . amoxicillin (AMOXIL) 500 MG tablet    Sig: Take 2 g 1 hr prior to dental procedure    Dispense:  10 tablet    Refill:  0  . acetaminophen-codeine (TYLENOL #3) 300-30 MG tablet    Sig: 1 po q 8hrs prn pain    Dispense:  30 tablet    Refill:  0      Procedures: No procedures performed   Clinical Data: No additional findings.  Objective: Vital Signs: There were no vitals taken for this visit.  Physical Exam:   Constitutional: Patient appears well-developed HEENT:  Head: Normocephalic Eyes:EOM are normal Neck: Normal  range of motion Cardiovascular: Normal rate Pulmonary/chest: Effort normal Neurologic: Patient is alert Skin: Skin is warm Psychiatric: Patient has normal mood and affect    Ortho Exam: Ortho exam demonstrates full active and passive range of motion of both hips.  Both knees have good range of motion with no effusion.  She has diminished pulses which are nonpalpable in both feet.  She has good ankle dorsi flexion plantarflexion strength with no paresthesias L1-S1.  Normal gait.  No muscle atrophy in the legs.  Both feet are warm to touch.  Specialty Comments:  No specialty comments available.  Imaging: No results found.   PMFS History: Patient Active Problem List   Diagnosis Date Noted  . Murmur 09/05/2018  . Aortic atherosclerosis ([place]) 08/04/2018  . Elevated ferritin 08/04/2018  . Other fatigue 07/14/2018  . Iron disorder 07/14/2018  . Glaucoma 07/11/2017  . Vaccine counseling 07/11/2017  . Varicose vein of leg 07/11/2017  . Need for shingles vaccine 07/11/2017  . Hearing loss 06/27/2016  . Medicare annual wellness visit, subsequent 06/26/2016  . Osteoporosis 01/12/2016  . Dyslipidemia 06/14/2015  . Essential hypertension 06/14/2015  . Mitral valve regurgitation 06/14/2015  . Tricuspid valve insufficiency 06/14/2015  . Vitamin D deficiency 06/14/2015  .  Rhinitis, allergic 06/14/2015  . Renal insufficiency 06/14/2015  . 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 AVNRT (AV nodal re-entry tachycardia) ([place]) 03/23/2014   Past Medical History:  Diagnosis Date  . Allergy   . Anxiety   . Atrophic vaginitis   . Cataract    hx/o surgery, both eyes; Dr. Gershon Crane  . Diverticulosis    per 2010 colonoscopy  . Dyslipidemia   . Full dentures   . GERD (gastroesophageal reflux disease)    hx/o, resolved  . Glaucoma 2016  . H/O echocardiogram 06/22/2010   normal LVEF, moderate mitral and tricuspid regurg 07/2016 Dr.  [name];  mild aortic valve stenosis, left ventricular normal, EF 55%; Dr. Gwenlyn Found  06/2010  . History of cardiovascular stress test 06/22/2010   normal bruce myocadial perfusion study, 72% EF; Dr. Gwenlyn Found  . History of mammogram    benign bilat calcifications, heterogeneously dense breasts, stable mammograms 2009-2014  . 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 Marti Sleigh gyencology oncology [place]; Dr. Lorriane Shire gynecology  . Hypertension   . Lactose intolerance   . 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 Rollene Fare  . Osteoporosis   . SVT (supraventricular tachycardia) ([place])    asymptomatic 2014, hx/o SVT, Dr. Rollene Fare  . Vitamin D deficiency     Family History  Family history unknown: Yes    Past Surgical History:  Procedure Laterality Date  . ABDOMINAL HYSTERECTOMY Bilateral    h/o uterine cancer; oophrectomy  . CATARACT EXTRACTION     bilat  . COLONOSCOPY  08/2016   08/2016  Dr. Benson Norway advised no repeat colonoscopy;  scattered diverticla, medium hemorrhoids 2010; Dr. Benson Norway  . NM MYOCAR PERF WALL MOTION    . TOTAL HIP ARTHROPLASTY Bilateral 2008, 2010    twice left (complication on the left, once on the right; Dr. Alphonzo Severance  . TRANSTHORACIC ECHOCARDIOGRAM  02/2013   ordered for SVT; EF 55-65%; calcified MV annulus, mod MR; RSVP increased; RA mildly dilated; mod TR; PA peak pressure 67mmHg   Social History   Occupational History  . Not on file  Tobacco Use  . Smoking status: Never Smoker  . Smokeless tobacco: Never Used  Substance and Sexual Activity  . Alcohol use: No  . Drug use: No  . Sexual activity: Never    Comment: walks 3x/wk, retired, married, 2 grandchildren

## 2018-09-29 ENCOUNTER — Other Ambulatory Visit (INDEPENDENT_AMBULATORY_CARE_PROVIDER_SITE_OTHER): Payer: Self-pay | Admitting: Orthopedic Surgery

## 2018-09-29 DIAGNOSIS — R0989 Other specified symptoms and signs involving the circulatory and respiratory systems: Secondary | ICD-10-CM

## 2018-09-29 NOTE — Telephone Encounter (Signed)
[facility] [place] and lvm to return to get pt scheduled.

## 2018-09-29 NOTE — Telephone Encounter (Signed)
Pt is actually scheduled at [facility] on Friday Oct 18th at 3:00pm at [address] Location, pt is aware of appt

## 2018-09-30 ENCOUNTER — Ambulatory Visit (INDEPENDENT_AMBULATORY_CARE_PROVIDER_SITE_OTHER): Payer: Medicare Other | Admitting: Vascular Surgery

## 2018-09-30 ENCOUNTER — Encounter (INDEPENDENT_AMBULATORY_CARE_PROVIDER_SITE_OTHER): Payer: Medicare Other

## 2018-10-03 ENCOUNTER — Ambulatory Visit
Admission: RE | Admit: 2018-10-03 | Discharge: 2018-10-03 | Disposition: A | Discharge status: Home / Self Care | Payer: Medicare Other | Source: Ambulatory Visit | Attending: Orthopedic Surgery | Admitting: Orthopedic Surgery

## 2018-10-03 DIAGNOSIS — R0989 Other specified symptoms and signs involving the circulatory and respiratory systems: Secondary | ICD-10-CM

## 2018-10-09 ENCOUNTER — Telehealth (INDEPENDENT_AMBULATORY_CARE_PROVIDER_SITE_OTHER): Payer: Self-pay | Admitting: Orthopedic Surgery

## 2018-10-09 DIAGNOSIS — M79604 Pain in right leg: Secondary | ICD-10-CM

## 2018-10-09 DIAGNOSIS — M79605 Pain in left leg: Principal | ICD-10-CM

## 2018-10-09 NOTE — Telephone Encounter (Signed)
Please advise. Thanks.  

## 2018-10-09 NOTE — Telephone Encounter (Signed)
Patient called would like Dr.Dean to call her and go over MRI results. I advised patient that we normally scheduled a review and she stated she would like to discuss results over the phone.

## 2018-10-10 ENCOUNTER — Telehealth (INDEPENDENT_AMBULATORY_CARE_PROVIDER_SITE_OTHER): Payer: Self-pay

## 2018-10-10 NOTE — Telephone Encounter (Signed)
I put in order for lumbar scan.

## 2018-10-10 NOTE — Telephone Encounter (Signed)
Pulses good.  Plan at this time is MRI scan of her back because I think this calf pain is neurogenic claudication and not vascular claudication.  Once we get the MRI scan on her back then we can set her up for injections when she becomes symptomatic.  Please set up MRI scan for lumbar spine thanks.  I called patient to inform of same

## 2018-10-10 NOTE — Telephone Encounter (Signed)
Pateint called again this morning about her MRI results.  Would like a call back from Dr. Marlou Sa.  Cb# is [phone].  Please advise.  Thank you.

## 2018-10-10 NOTE — Telephone Encounter (Signed)
See other note sent to Dr Marlou Sa Waiting to be advised.

## 2018-10-10 NOTE — Telephone Encounter (Signed)
See other note. Dr Marlou Sa called patient.

## 2018-10-13 ENCOUNTER — Other Ambulatory Visit (INDEPENDENT_AMBULATORY_CARE_PROVIDER_SITE_OTHER): Payer: Medicare Other

## 2018-10-13 DIAGNOSIS — Z23 Encounter for immunization: Secondary | ICD-10-CM

## 2018-10-25 ENCOUNTER — Encounter: Payer: Self-pay | Admitting: Radiology

## 2018-10-25 ENCOUNTER — Ambulatory Visit
Admission: RE | Admit: 2018-10-25 | Discharge: 2018-10-25 | Disposition: A | Discharge status: Home / Self Care | Payer: Medicare Other | Source: Ambulatory Visit | Attending: Orthopedic Surgery | Admitting: Orthopedic Surgery

## 2018-10-25 DIAGNOSIS — M79604 Pain in right leg: Secondary | ICD-10-CM

## 2018-10-25 DIAGNOSIS — M79605 Pain in left leg: Principal | ICD-10-CM

## 2018-10-27 NOTE — Progress Notes (Signed)
Please call patient with results. Thanks looks like she has some wear and tear in that spine that could be accounting for some of this pain.  I think she would do well to get 1 to 2 injections with Dr. Ernestina Patches.  Please have her follow-up with him.

## 2018-11-18 ENCOUNTER — Encounter (INDEPENDENT_AMBULATORY_CARE_PROVIDER_SITE_OTHER): Payer: Self-pay | Admitting: Physical Medicine and Rehabilitation

## 2018-11-18 ENCOUNTER — Ambulatory Visit (INDEPENDENT_AMBULATORY_CARE_PROVIDER_SITE_OTHER): Payer: Medicare Other | Admitting: Physical Medicine and Rehabilitation

## 2018-11-18 ENCOUNTER — Other Ambulatory Visit: Payer: Self-pay | Admitting: Medical

## 2018-11-18 VITALS — Ht 62.0 in | Wt 154.0 lb

## 2018-11-18 DIAGNOSIS — M5441 Lumbago with sciatica, right side: Secondary | ICD-10-CM

## 2018-11-18 DIAGNOSIS — M48061 Spinal stenosis, lumbar region without neurogenic claudication: Secondary | ICD-10-CM | POA: Diagnosis not present

## 2018-11-18 DIAGNOSIS — M419 Scoliosis, unspecified: Secondary | ICD-10-CM

## 2018-11-18 DIAGNOSIS — G8929 Other chronic pain: Secondary | ICD-10-CM

## 2018-11-18 DIAGNOSIS — M5416 Radiculopathy, lumbar region: Secondary | ICD-10-CM

## 2018-11-18 NOTE — Progress Notes (Signed)
 .  Numeric Pain Rating Scale and Functional Assessment Average Pain 7 Pain Right Now 0 My pain is intermittent and dull Pain is worse with: some activites Pain improves with: medication   In the last MONTH (on 0-10 scale) has pain interfered with the following?  1. General activity like being  able to carry out your everyday physical activities such as walking, climbing stairs, carrying groceries, or moving a chair?  Rating(6)  2. Relation with others like being able to carry out your usual social activities and roles such as  activities at home, at work and in your community. Rating(6)  3. Enjoyment of life such that you have  been bothered by emotional problems such as feeling anxious, depressed or irritable?  Rating(0)

## 2018-11-19 ENCOUNTER — Ambulatory Visit: Payer: Medicare Other | Admitting: Medical

## 2018-11-19 ENCOUNTER — Encounter (INDEPENDENT_AMBULATORY_CARE_PROVIDER_SITE_OTHER): Payer: Self-pay | Admitting: Physical Medicine and Rehabilitation

## 2018-11-19 NOTE — Progress Notes (Signed)
[name] - 84 y.o. female MRN [id number]  Date of birth: 1933/06/08  Office Visit Note: Visit Date: 11/18/2018 PCP: Carlena Hurl, PA-C Referred by: Carlena Hurl, PA-C  Subjective: Chief Complaint  Patient presents with  . Lower Back - Pain  . Right Lower Leg - Pain   HPI: [name] is a 84 y.o. female who comes in today For evaluation management of her right radicular leg pain.  She has been followed by Dr. Anderson Malta and was having several months of worsening low back and buttock pain on the right but her main complaint was pain and some tingling in the right posterior lateral calf.  He felt like she had diminished blood vessels and she does have a cardiac history so we did send her for Doppler ultrasounds of her lower limbs which were normal.  There was some small vessel disease but nothing that would cause her to have claudication type symptoms.  He ended up ordering an MRI of the lumbar spine and she is here today for review of this.  I have actually seen the patient in the remote past.  She does have some history of hip problems and history of right hip replacement that had to be redone do to the type of hip that was implanted.  She denies any real left-sided complaints.  She denies any focal weakness or red flag complaints.  She actually reports today that she is not having much in the way of significant symptoms in the calf.  She tells me that she was worried at the time because she wondered if it was due to the hip that she had surgery on prior and she was also worried that it may be a vascular problem.  Review of Systems  Constitutional: Negative for chills, fever, malaise/fatigue and weight loss.  HENT: Negative for hearing loss and sinus pain.   Eyes: Negative for blurred vision, double vision and photophobia.  Respiratory: Negative for cough and shortness of breath.   Cardiovascular: Negative for chest pain, palpitations and leg swelling.    Gastrointestinal: Negative for abdominal pain, nausea and vomiting.  Genitourinary: Negative for flank pain.  Musculoskeletal: Positive for back pain and joint pain. Negative for myalgias.  Skin: Negative for itching and rash.  Neurological: Negative for tremors, focal weakness and weakness.  Endo/Heme/Allergies: Negative.   Psychiatric/Behavioral: Negative for depression.  All other systems reviewed and are negative.  Otherwise per HPI.  Assessment & Plan: Visit Diagnoses:  1. Lumbar radiculopathy   2. Chronic bilateral low back pain with right-sided sciatica   3. Scoliosis of lumbar spine, unspecified scoliosis type   4. Foraminal stenosis of lumbar region     Plan: Findings:  Recent history of worsening symptoms into the right posterior lateral calf but I do feel was probably related to her lumbar spine and others no high-grade stenosis.  There is L3-4 level with some central canal lateral recess narrowing mild to moderate and there is facet arthropathy and some scoliosis.  She has levels of foraminal narrowing but nothing severe.  Her back pain probably is related to facet arthropathy but both of these at this point are not very severe and she is doing well.  She was just very concerned at the time it was going on that it could have been from her total hip replacement that is been problematic for.  At this point we reviewed the MRI with images and spine model and talked about that  in terms of doing an injection should the symptoms return.  Right now she can continue with her current level of activity.    Meds & Orders: No orders of the defined types were placed in this encounter.  No orders of the defined types were placed in this encounter.   Follow-up: Return if symptoms worsen or fail to improve.   Procedures: No procedures performed  No notes on file   Clinical History: MRI LUMBAR SPINE WITHOUT CONTRAST  TECHNIQUE: Multiplanar, multisequence MR imaging of the lumbar spine  was performed. No intravenous contrast was administered.  COMPARISON:  Prior MRI from 03/18/2012.  FINDINGS: Segmentation: Normal segmentation. Lowest well-formed disc labeled the L5-S1 level.  Alignment: Levoscoliosis. 2 mm retrolisthesis of L1 on L2 and L2 on r L3, with 2 mm anterolisthesis of L3 on L4.  Vertebrae: Vertebral body heights maintained without evidence for acute or chronic fracture. Bone marrow signal intensity diffusely heterogeneous without discrete or worrisome osseous lesions. Scattered mild reactive endplate changes noted throughout the lumbar spine, most notable at the L4-5 interspace. No other abnormal marrow edema.  Conus medullaris and cauda equina: Conus extends to the L2 level. Conus and cauda equina appear normal.  Paraspinal and other soft tissues: Paraspinous soft tissues within normal limits. Asymmetric right renal atrophy with multifocal areas of cortical thinning and scarring. Few small T2 hyperintense cyst noted within the right kidney. Visualized visceral structures otherwise unremarkable.  Disc levels:  T10-11: Mild disc bulge with facet hypertrophy. No significant stenosis.  T11-12: Mild diffuse disc bulge with disc desiccation. Mild bilateral facet hypertrophy. No significant stenosis.  T12-L1: Diffuse disc bulge with chronic intervertebral disc space narrowing and disc desiccation. Mild right-sided facet hypertrophy. No significant canal or foraminal stenosis.  L1-2: Trace retrolisthesis. Diffuse disc bulge with disc desiccation and intervertebral disc space narrowing. Mild right-sided reactive endplate changes with marginal endplate osteophytic spurring. Mild facet hypertrophy. Resultant mild right lateral recess narrowing. Central canal remains patent. Mild bilateral L1 foraminal stenosis.  L2-3: Trace retrolisthesis. Chronic intervertebral disc space narrowing with diffuse disc bulge and disc desiccation.  Chronic reactive endplate changes, greatest on the right. Moderate right with mild left facet hypertrophy. Resultant mild to moderate right lateral recess narrowing without significant spinal stenosis. Moderate right with mild left L2 foraminal stenosis.  L3-4: Trace anterolisthesis. Diffuse disc bulge with disc desiccation and intervertebral disc space narrowing. Superimposed shallow right foraminal disc protrusion with associated annular fissure (series 5, image 9). Moderate bilateral facet hypertrophy, right greater than left. Resultant mild canal with bilateral lateral recess narrowing. Mild to moderate right L3 foraminal stenosis.  L4-5: Diffuse disc bulge with disc desiccation and intervertebral disc space narrowing. Disc bulging asymmetric to the left, contacting the descending left L5 nerve root in the left lateral recess. Resultant moderate left lateral recess stenosis. Central canal remains patent. Mild to moderate left L4 foraminal narrowing.  L5-S1: Diffuse disc bulge with disc desiccation. Mild reactive endplate changes. Bilateral facet hypertrophy. No significant canal stenosis. Mild to moderate left L5 foraminal narrowing.  IMPRESSION: 1. Levoconvex scoliosis with multilevel degenerative spondylolysis as above. Resultant mild to moderate right lateral recess narrowing at L1-2 and L2-3, with mild spinal stenosis at L3-4, and moderate left lateral recess narrowing at L4-5. 2. Multifactorial degenerative changes with resultant multilevel foraminal narrowing as above. Notable findings include moderate right L2 foraminal stenosis, with mild to moderate right L3, left L4, and left L5 foraminal narrowing.   Electronically Signed   By: Pincus Badder.D.  On: 10/26/2018 00:09   She reports that she has never smoked. She has never used smokeless tobacco. No results for input(s): HGBA1C, LABURIC in the last 8760 hours.  Objective:  VS:  HT:5\' 2"  (157.5 cm)    WT:154 lb (69.9 kg)  BMI:28.16    BP:   HR: bpm  TEMP: ( )  RESP:  Physical Exam  Constitutional: She is oriented to person, place, and time. She appears well-developed and well-nourished.  Eyes: Pupils are equal, round, and reactive to light. Conjunctivae and EOM are normal.  Cardiovascular: Normal rate and intact distal pulses.  Pulmonary/Chest: Effort normal.  Musculoskeletal:  Painless range of motion of both hips.  No pain over the greater trochanter she has good distal strength without clonus.  She is somewhat slow to rise from a seated position to full extension and does have pain with facet joint loading of the lumbar spine.  Neurological: She is alert and oriented to person, place, and time. She exhibits normal muscle tone. Coordination normal.  Skin: Skin is warm and dry. No rash noted. No erythema.  Psychiatric: She has a normal mood and affect. Her behavior is normal.  Nursing note and vitals reviewed.   Ortho Exam Imaging: No results found.  Past Medical/Family/Surgical/Social History: Medications & Allergies reviewed per EMR, new medications updated. Patient Active Problem List   Diagnosis Date Noted  . Murmur 09/05/2018  . Aortic atherosclerosis ([place]) 08/04/2018  . Elevated ferritin 08/04/2018  . Other fatigue 07/14/2018  . Iron disorder 07/14/2018  . Glaucoma 07/11/2017  . Vaccine counseling 07/11/2017  . Varicose vein of leg 07/11/2017  . Need for shingles vaccine 07/11/2017  . Hearing loss 06/27/2016  . Medicare annual wellness visit, subsequent 06/26/2016  . Osteoporosis 01/12/2016  . Dyslipidemia 06/14/2015  . Essential hypertension 06/14/2015  . Mitral valve regurgitation 06/14/2015  . Tricuspid valve insufficiency 06/14/2015  . Vitamin D deficiency 06/14/2015  . Rhinitis, allergic 06/14/2015  . Renal insufficiency 06/14/2015  . 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 AVNRT (AV nodal  re-entry tachycardia) ([place]) 03/23/2014   Past Medical History:  Diagnosis Date  . Allergy   . Anxiety   . Atrophic vaginitis   . Cataract    hx/o surgery, both eyes; Dr. Gershon Crane  . Diverticulosis    per 2010 colonoscopy  . Dyslipidemia   . Full dentures   . GERD (gastroesophageal reflux disease)    hx/o, resolved  . Glaucoma 2016  . H/O echocardiogram 06/22/2010   normal LVEF, moderate mitral and tricuspid regurg 07/2016 [name];  mild aortic valve stenosis, left ventricular normal, EF 55%; Dr. Gwenlyn Found  06/2010  . History of cardiovascular stress test 06/22/2010   normal bruce myocadial perfusion study, 72% EF; Dr. Gwenlyn Found  . History of mammogram    benign bilat calcifications, heterogeneously dense breasts, stable mammograms 2009-2014  . 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 Marti Sleigh gyencology oncology [place]; Dr. Lorriane Shire gynecology  . Hypertension   . Lactose intolerance   . 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 Rollene Fare  . Osteoporosis   . SVT (supraventricular tachycardia) ([place])    asymptomatic 2014, hx/o SVT, Dr. Rollene Fare  . Vitamin D deficiency    Family  History  Family history unknown: Yes   Past Surgical History:  Procedure Laterality Date  . ABDOMINAL HYSTERECTOMY Bilateral    h/o uterine cancer; oophrectomy  . CATARACT EXTRACTION     bilat  . COLONOSCOPY  08/2016   08/2016  Dr. Benson Norway advised no repeat colonoscopy;  scattered diverticla, medium hemorrhoids 2010; Dr. Benson Norway  . NM MYOCAR PERF WALL MOTION    . TOTAL HIP ARTHROPLASTY Bilateral 2008, 2010    twice left (complication on the left, once on the right; Dr. Alphonzo Severance  . TRANSTHORACIC ECHOCARDIOGRAM  02/2013   ordered for SVT; EF 55-65%; calcified MV annulus, mod MR; RSVP  increased; RA mildly dilated; mod TR; PA peak pressure 107mmHg   Social History   Occupational History  . Not on file  Tobacco Use  . Smoking status: Never Smoker  . Smokeless tobacco: Never Used  Substance and Sexual Activity  . Alcohol use: No  . Drug use: No  . Sexual activity: Never    Comment: walks 3x/wk, retired, married, 2 grandchildren

## 2018-11-20 ENCOUNTER — Encounter: Payer: Self-pay | Admitting: Medical

## 2018-11-20 ENCOUNTER — Ambulatory Visit: Payer: Medicare Other | Admitting: Medical

## 2018-11-20 VITALS — BP 120/80 | HR 64 | Temp 97.8°F | Resp 16 | Ht 62.0 in | Wt 156.8 lb

## 2018-11-20 DIAGNOSIS — K921 Melena: Secondary | ICD-10-CM | POA: Diagnosis not present

## 2018-11-20 DIAGNOSIS — K644 Residual hemorrhoidal skin tags: Secondary | ICD-10-CM | POA: Diagnosis not present

## 2018-11-20 NOTE — Progress Notes (Signed)
Subjective: Chief Complaint  Patient presents with  . rectal bleeding    rectal bleeding X 1 time last week.    needs atenolol refilled   Here for concerns of blood in the stool.  She notes last week on one specific occasion she saw a small amount of bright red blood on toilet paper.  But did not see blood mixed in with the stool no blood in the bowl.  Has not had blood before after that episode.  She had eaten a lot more roughage that day and harder texture foods and thinks this contributed.  She denies constipation in general.  Normally has a daily soft bowel movement.  She does completed the Cologuard cancer screening in September which was normal.  No other complaint.  No weight loss no fever no night sweats no bowel issues otherwise no abdominal pain.  Family History  Family history unknown: Yes   Past Surgical History:  Procedure Laterality Date  . ABDOMINAL HYSTERECTOMY Bilateral    h/o uterine cancer; oophrectomy  . CATARACT EXTRACTION     bilat  . COLONOSCOPY  08/2016   08/2016  Dr. Benson Norway advised no repeat colonoscopy;  scattered diverticla, medium hemorrhoids 2010; Dr. Benson Norway  . NM MYOCAR PERF WALL MOTION    . TOTAL HIP ARTHROPLASTY Bilateral 2008, 2010    twice left (complication on the left, once on the right; Dr. Alphonzo Severance  . TRANSTHORACIC ECHOCARDIOGRAM  02/2013   ordered for SVT; EF 55-65%; calcified MV annulus, mod MR; RSVP increased; RA mildly dilated; mod TR; PA peak pressure 49mmHg   Review of systems as in subjective   Objective: BP 120/80   Pulse 64   Temp 97.8 F (36.6 C) (Oral)   Resp 16   Ht 5\' 2"  (1.575 m)   Wt 156 lb 12.8 oz (71.1 kg)   SpO2 98%   BMI 28.68 kg/m   General: Well-developed well-nourished no acute distress Rectal exam reveals small external hemorrhoid no fissure noted tenderness no other abnormality, exam chaperoned by nurse    Assessment: Encounter Diagnoses  Name Primary?  . Blood in stool Yes  . External hemorrhoid       Plan: We discussed her symptoms and concerns.  I suspect she had small bleeding from a hemorrhoid from the recommend she had that day.  She has only had one brief episode of bright red blood.  I reviewed her Cologuard test from September which was negative.  Advised that she can take a watch and wait approach and she will let me know if she continues to see blood regularly particular if mixing within the stool.  She can take an occasional stool softener if needed.  Otherwise follow-up as needed

## 2019-01-12 ENCOUNTER — Other Ambulatory Visit: Payer: Self-pay | Admitting: Medical

## 2019-01-26 LAB — HM MAMMOGRAPHY

## 2019-01-27 ENCOUNTER — Telehealth: Payer: Self-pay | Admitting: Medical

## 2019-01-27 NOTE — Telephone Encounter (Signed)
I am happy to report that her mammogram was normal, no worrisome findings.

## 2019-01-27 NOTE — Telephone Encounter (Signed)
Left message on voicemail for patient to call back. 

## 2019-01-28 NOTE — Telephone Encounter (Signed)
Left message on voicemail for patient to call back. 

## 2019-01-28 NOTE — Telephone Encounter (Signed)
Patient notified of mammogram results.

## 2019-02-06 ENCOUNTER — Encounter: Payer: Self-pay | Admitting: Internal Medicine

## 2019-02-06 NOTE — Progress Notes (Signed)
HEMATOLOGY/ONCOLOGY CONSULTATION NOTE  Date of Service: 02/09/2019  Patient Care Team: Carlena Hurl, PA-C as PCP - General (Family Medicine)  CHIEF COMPLAINTS/PURPOSE OF CONSULTATION:  Elevated ferritin   HISTORY OF PRESENTING ILLNESS:   [name] is a wonderful 85 y.o. female who has been referred to [place] by Chana Bode, PA for evaluation and management of Elevated ferritin. The pt reports that she is doing well overall.   The pt reports that she has had three hip surgeries, first hip surgery right hip 10 years ago for arthritis. She notes that one of her hip replacements was found to be defective and was subsequently replaced. She notes that she has developed some more fatigue recently and was evaluated for this.   She notes that she does not take Iron replacements and denies cooking in a cast iron skillet regularly. She denies consuming ETOH whatsoever. The pt also notes that she has never had to receive a blood transfusion.   The pt notes that she walks at her local gym every day and makes a point to get out of her house every day.   Most recent lab results (07/14/18) of CBC w/diff and CMP is as follows: all values are WNL except for WBC at 3.2k. Ferritin 07/14/18 was at 281 Iron/TIBC 07/14/18 showed all values WNL.   On review of systems, pt reports some fatigue, staying active, and denies new bone pains, abdominal pains, leg swelling, and any other symptoms.   On Social Hx the pt denies smoking or drinking ETOH.  Interval History:   [name] returns today for management and evaluation of her elevated ferritin. The patient's last visit with [place] was on 08/11/18. The pt reports that she is doing well overall.   The pt reports that she has not developed any new concerns in the interim. She notes that she enjoyed her holidays and continues to do all she wants to do. She denies any abdominal pains and is moving her bowels well. She denies new rashes or skin  changes. She does not consume alcohol and has avoided cooking in cast iron skillets.  Lab results today (02/09/19) of CBC w/diff and CMP is as follows: all values are WNL except for GFR at 52. 02/09/19 Ferritin is 195 02/09/19 Iron saturation of 35%.  On review of systems, pt reports good energy levels, moving her bowels well, eating well, and denies skin rashes, abdominal pains, skin changes, and any other symptoms.   MEDICAL HISTORY:  Past Medical History:  Diagnosis Date  . Allergy   . Anxiety   . Atrophic vaginitis   . Cataract    hx/o surgery, both eyes; Dr. Gershon Crane  . Diverticulosis    per 2010 colonoscopy  . Dyslipidemia   . Full dentures   . GERD (gastroesophageal reflux disease)    hx/o, resolved  . Glaucoma 2016  . H/O echocardiogram 06/22/2010   normal LVEF, moderate mitral and tricuspid regurg 07/2016 [name];  mild aortic valve stenosis, left ventricular normal, EF 55%; Dr. Gwenlyn Found  06/2010  . History of cardiovascular stress test 06/22/2010   normal bruce myocadial perfusion study, 72% EF; Dr. Gwenlyn Found  . History of mammogram    benign bilat calcifications, heterogeneously dense breasts, stable mammograms 2009-2014  . 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 Marti Sleigh gyencology oncology  ; Dr. Lorriane Shire gynecology  . Hypertension   . Lactose intolerance   . 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 Rollene Fare  . Osteoporosis   . SVT (supraventricular tachycardia) ([place])    asymptomatic 2014, hx/o SVT, Dr. Rollene Fare  . Vitamin D deficiency     SURGICAL HISTORY: Past Surgical History:  Procedure Laterality Date  . ABDOMINAL HYSTERECTOMY Bilateral    h/o uterine cancer; oophrectomy  . CATARACT EXTRACTION     bilat  . COLONOSCOPY  08/2016     08/2016  Dr. Benson Norway advised no repeat colonoscopy;  scattered diverticla, medium hemorrhoids 2010; Dr. Benson Norway  . NM MYOCAR PERF WALL MOTION    . TOTAL HIP ARTHROPLASTY Bilateral 2008, 2010    twice left (complication on the left, once on the right; Dr. Alphonzo Severance  . TRANSTHORACIC ECHOCARDIOGRAM  02/2013   ordered for SVT; EF 55-65%; calcified MV annulus, mod MR; RSVP increased; RA mildly dilated; mod TR; PA peak pressure 43mmHg    SOCIAL HISTORY: Social History   Socioeconomic History  . Marital status: Married    Spouse name: Not on file  . Number of children: 1  . Years of education: Not on file  . Highest education level: Not on file  Occupational History  . Not on file  Social Needs  . Financial resource strain: Not on file  . Food insecurity:    Worry: Not on file    Inability: Not on file  . Transportation needs:    Medical: Not on file    Non-medical: Not on file  Tobacco Use  . Smoking status: Never Smoker  . Smokeless tobacco: Never Used  Substance and Sexual Activity  . Alcohol use: No  . Drug use: No  . Sexual activity: Never    Comment: walks 3x/wk, retired, married, 2 grandchildren  Lifestyle  . Physical activity:    Days per week: Not on file    Minutes per session: Not on file  . Stress: Not on file  Relationships  . Social connections:    Talks on phone: Not on file    Gets together: Not on file    Attends religious service: Not on file    Active member of club or organization: Not on file    Attends meetings of clubs or organizations: Not on file    Relationship status: Not on file  . Intimate partner violence:    Fear of current or ex partner: Not on file    Emotionally abused: Not on file    Physically abused: Not on file    Forced sexual activity: Not on file  Other Topics Concern  . Not on file  Social History Narrative   Married, 1 son, 2 grandchildren; exercise 4 days per week - walking, mall and [employer].  Husband has hx/o CVA, but is  ambulatory.  06/2017    FAMILY HISTORY: Family History  Family history unknown: Yes    ALLERGIES:  has No Known Allergies.  MEDICATIONS:  Current Outpatient Medications  Medication Sig Dispense Refill  . acetaminophen-codeine (TYLENOL #3) 300-30 MG tablet 1 po q 8hrs prn pain (Patient not taking: Reported on 11/20/2018) 30 tablet 0  . alendronate (FOSAMAX) 70 MG tablet TAKE 1 TABLET BY MOUTH EVERY 7 DAYS WITH A FULL GLASS OF WATER AN EMPTY STOMACH 12 tablet 3  . aspirin 81 MG tablet Take 1 tablet (81 mg total) by mouth daily.  90 tablet 3  . atenolol (TENORMIN) 50 MG tablet Take 1 tablet (50 mg total) by mouth daily. 90 tablet 2  . calcium carbonate (CALCIUM 600) 600 MG TABS tablet Take 1 tablet (600 mg total) by mouth 2 (two) times daily with a meal. 180 tablet 3  . cetirizine (ZYRTEC) 10 MG tablet TAKE 1 TABLET (10 MG TOTAL) BY MOUTH AT BEDTIME. (Patient not taking: Reported on 11/20/2018) 30 tablet 2  . Cholecalciferol (D3 HIGH POTENCY) 1000 units capsule Take 1 capsule (1,000 Units total) by mouth daily. 90 capsule 3  . latanoprost (XALATAN) 0.005 % ophthalmic solution 1 drop at bedtime.    Marland Kitchen lisinopril (PRINIVIL,ZESTRIL) 10 MG tablet Take 1 tablet (10 mg total) by mouth daily. 90 tablet 2  . simvastatin (ZOCOR) 40 MG tablet TAKE 1 TABLET BY MOUTH AT BEDTIME. 90 tablet 0   No current facility-administered medications for this visit.     REVIEW OF SYSTEMS:    A 10+ POINT REVIEW OF SYSTEMS WAS OBTAINED including neurology, dermatology, psychiatry, cardiac, respiratory, lymph, extremities, GI, GU, Musculoskeletal, constitutional, breasts, reproductive, HEENT.  All pertinent positives are noted in the HPI.  All others are negative.   PHYSICAL EXAMINATION:  . Vitals:   02/09/19 0913  BP: 139/83  Pulse: 76  Resp: 18  Temp: 97.8 F (36.6 C)  SpO2: 100%   Filed Weights   02/09/19 0913  Weight: 155 lb 8 oz (70.5 kg)   .Body mass index is 28.44 kg/m.  GENERAL:alert, in no  acute distress and comfortable SKIN: no acute rashes, no significant lesions EYES: conjunctiva are pink and non-injected, sclera anicteric OROPHARYNX: MMM, no exudates, no oropharyngeal erythema or ulceration NECK: supple, no JVD LYMPH:  no palpable lymphadenopathy in the cervical, axillary or inguinal regions LUNGS: clear to auscultation b/l with normal respiratory effort HEART: regular rate & rhythm ABDOMEN:  normoactive bowel sounds , non tender, not distended. No palpable hepatosplenomegaly.  Extremity: no pedal edema PSYCH: alert & oriented x 3 with fluent speech NEURO: no focal motor/sensory deficits   LABORATORY DATA:  I have reviewed the data as listed  . CBC Latest Ref Rng & Units 02/09/2019 08/11/2018 07/14/2018  WBC 4.0 - 10.5 K/uL 4.2 4.5 3.2(L)  Hemoglobin 12.0 - 15.0 g/dL 14.0 14.3 14.6  Hematocrit 36.0 - 46.0 % 44.1 43.7 43.1  Platelets 150 - 400 K/uL 182 233 200    . CMP Latest Ref Rng & Units 02/09/2019 08/11/2018 07/14/2018  Glucose 70 - 99 mg/dL 80 94 90  BUN 8 - 23 mg/dL 16 14 12   Creatinine 0.44 - 1.00 mg/dL 1.00 1.07(H) 0.99  Sodium 135 - 145 mmol/L 143 143 141  Potassium 3.5 - 5.1 mmol/L 4.5 5.0 4.7  Chloride 98 - 111 mmol/L 107 105 102  CO2 22 - 32 mmol/L 27 29 25   Calcium 8.9 - 10.3 mg/dL 9.5 10.5(H) 9.6  Total Protein 6.5 - 8.1 g/dL 8.1 8.9(H) 8.0  Total Bilirubin 0.3 - 1.2 mg/dL 1.1 0.9 0.8  Alkaline Phos 38 - 126 U/L 67 70 66  AST 15 - 41 U/L 28 33 27  ALT 0 - 44 U/L 21 26 18        RADIOGRAPHIC STUDIES: I have personally reviewed the radiological images as listed and agreed with the findings in the report. No results found.  ASSESSMENT & PLAN:  85 y.o. female with  1. Elevated ferritin  Labs upon initial presentation from 07/14/18, Ferrtin was at 281, no anemia, normal Iron/TIBC values.  PLAN -Discussed pt labwork today, 02/09/19; blood counts are normal, GFR stable, other chemistries are normal. -02/09/19 Ferritin is 195 which is WNL. Last  Ferritin was normalized to 204 -Hereditary Hemochromatosis ruled out -Temporary inflammation can increase ferritin temporarily as well -Iron saturation has been normal as well -Do not suspect her previously elevated ferritin reflects cause for concern -Pt does not consume alcohol, and has avoided cooking with cast iron skillets, has avoided iron supplements, and does not consume much red meat. Advised continuing this   RTC with Dr Irene Limbo as needed   All of the patients questions were answered with apparent satisfaction. The patient knows to call the clinic with any problems, questions or concerns.  The total time spent in the appt was 20 minutes and more than 50% was on counseling and direct patient cares.    Sullivan Lone MD MS AAHIVMS [facility] Hematology/Oncology Physician [facility]  (Office):       [phone] (Work cell):  [phone] (Fax):           [phone]  02/09/2019 9:37 AM  I, Baldwin Jamaica, am acting as a scribe for Dr. Sullivan Lone.   .I have reviewed the above documentation for accuracy and completeness, and I agree with the above. Brunetta Genera MD

## 2019-02-09 ENCOUNTER — Telehealth: Payer: Self-pay | Admitting: Hematology

## 2019-02-09 ENCOUNTER — Inpatient Hospital Stay: Payer: Medicare Other | Attending: Hematology

## 2019-02-09 ENCOUNTER — Inpatient Hospital Stay: Payer: Medicare Other | Admitting: Hematology

## 2019-02-09 VITALS — BP 139/83 | HR 76 | Temp 97.8°F | Resp 18 | Ht 62.0 in | Wt 155.5 lb

## 2019-02-09 DIAGNOSIS — Z79899 Other long term (current) drug therapy: Secondary | ICD-10-CM | POA: Diagnosis not present

## 2019-02-09 DIAGNOSIS — Z7982 Long term (current) use of aspirin: Secondary | ICD-10-CM | POA: Insufficient documentation

## 2019-02-09 DIAGNOSIS — R7989 Other specified abnormal findings of blood chemistry: Secondary | ICD-10-CM | POA: Insufficient documentation

## 2019-02-09 LAB — CMP (CANCER CENTER ONLY)
ALBUMIN: 3.7 g/dL (ref 3.5–5.0)
ALT: 21 U/L (ref 0–44)
AST: 28 U/L (ref 15–41)
Alkaline Phosphatase: 67 U/L (ref 38–126)
Anion gap: 9 (ref 5–15)
BUN: 16 mg/dL (ref 8–23)
CALCIUM: 9.5 mg/dL (ref 8.9–10.3)
CO2: 27 mmol/L (ref 22–32)
CREATININE: 1 mg/dL (ref 0.44–1.00)
Chloride: 107 mmol/L (ref 98–111)
GFR, Estimated: 52 mL/min — ABNORMAL LOW (ref >60)
Glucose, Bld: 80 mg/dL (ref 70–99)
Potassium: 4.5 mmol/L (ref 3.5–5.1)
SODIUM: 143 mmol/L (ref 135–145)
Total Bilirubin: 1.1 mg/dL (ref 0.3–1.2)
Total Protein: 8.1 g/dL (ref 6.5–8.1)

## 2019-02-09 LAB — CBC WITH DIFFERENTIAL/PLATELET
Abs Immature Granulocytes: 0 10*3/uL (ref 0.00–0.07)
Basophils Absolute: 0 10*3/uL (ref 0.0–0.1)
Basophils Relative: 1 %
EOS ABS: 0.1 10*3/uL (ref 0.0–0.5)
Eosinophils Relative: 2 %
HEMATOCRIT: 44.1 % (ref 36.0–46.0)
Hemoglobin: 14 g/dL (ref 12.0–15.0)
Immature Granulocytes: 0 %
LYMPHS ABS: 1.3 10*3/uL (ref 0.7–4.0)
Lymphocytes Relative: 32 %
MCH: 28.7 pg (ref 26.0–34.0)
MCHC: 31.7 g/dL (ref 30.0–36.0)
MCV: 90.6 fL (ref 80.0–100.0)
Monocytes Absolute: 0.6 10*3/uL (ref 0.1–1.0)
Monocytes Relative: 15 %
Neutro Abs: 2.2 10*3/uL (ref 1.7–7.7)
Neutrophils Relative %: 50 %
Platelets: 182 10*3/uL (ref 150–400)
RBC: 4.87 MIL/uL (ref 3.87–5.11)
RDW: 14.3 % (ref 11.5–15.5)
WBC: 4.2 10*3/uL (ref 4.0–10.5)
nRBC: 0 % (ref 0.0–0.2)

## 2019-02-09 LAB — IRON AND TIBC
Iron: 99 ug/dL (ref 41–142)
Saturation Ratios: 35 % (ref 21–57)
TIBC: 281 ug/dL (ref 236–444)
UIBC: 182 ug/dL (ref 120–384)

## 2019-02-09 LAB — FERRITIN: Ferritin: 195 ng/mL (ref 11–307)

## 2019-02-09 NOTE — Telephone Encounter (Signed)
Per 2/24 los RTC with Dr Irene Limbo as needed.

## 2019-04-05 ENCOUNTER — Other Ambulatory Visit: Payer: Self-pay | Admitting: Medical

## 2019-04-06 NOTE — Telephone Encounter (Signed)
Is this ok to refill?  

## 2019-05-07 ENCOUNTER — Other Ambulatory Visit: Payer: Self-pay | Admitting: Medical

## 2019-06-11 ENCOUNTER — Other Ambulatory Visit: Payer: Self-pay

## 2019-06-11 ENCOUNTER — Telehealth: Payer: Self-pay | Admitting: Medical

## 2019-06-11 ENCOUNTER — Encounter: Payer: Self-pay | Admitting: Medical

## 2019-06-11 ENCOUNTER — Ambulatory Visit (INDEPENDENT_AMBULATORY_CARE_PROVIDER_SITE_OTHER): Payer: Medicare Other | Admitting: Medical

## 2019-06-11 VITALS — BP 160/100 | HR 74 | Temp 98.4°F | Resp 16 | Wt 156.4 lb

## 2019-06-11 DIAGNOSIS — R9431 Abnormal electrocardiogram [ECG] [EKG]: Secondary | ICD-10-CM | POA: Insufficient documentation

## 2019-06-11 DIAGNOSIS — R0789 Other chest pain: Secondary | ICD-10-CM

## 2019-06-11 DIAGNOSIS — T148XXA Other injury of unspecified body region, initial encounter: Secondary | ICD-10-CM | POA: Insufficient documentation

## 2019-06-11 MED ORDER — CYCLOBENZAPRINE HCL 5 MG PO TABS: 5.0000 mg | ORAL_TABLET | Freq: Every day | ORAL | 0 refills | Status: DC

## 2019-06-11 NOTE — Telephone Encounter (Signed)
[name] I saw Mrs. [name] today for chest wall pain/pulled muscle.  An EKG was performed.  I don't think her current symptoms were cardiac, but nevertheless there were some new EKG findings not seen on prior EKG.    I wanted to make you aware of these.   We will get the EKG scanned in today.  Mrs. [name] said she has an upcoming appt in September, but I wanted you to see the EKG in case you want to see her sooner.   THanks

## 2019-06-11 NOTE — Progress Notes (Addendum)
Subjective: Chief Complaint  Patient presents with  . pulled muscle    pulled muscle chest, shoulder and back X 1 week   Here for left chest wall pain.  She notes that about a week ago she was getting groceries out of her car.  She lifted a bag with her left hand out of her car that had 2 sixpacks of bottled water.  She did not think much about it at the time but it was kind of heavy.  She carried into the house.  The next day she noticed discomfort in her left chest wall around the rib cage and under her left breast and in her left chest.  For the next week she has had intermittent pains more of a soreness in her left chest mostly in the rib area laterally but at times under her left breast.  The pain comes and goes.  The pain is very mild.  She denies any other associated symptoms.  She denies arm pain no jaw pain no neck pain.  No shortness of breath no bruising no redness no sweats no chills no fever no nausea.  No chest heaviness.  No palpitations.  No swelling.  No pain with arm range of motion no pain with right range of motion of neck.  She denies injury or fall otherwise.  She has taken some Tylenol which has helped.  She also notes that heat pad over her chest has helped a little as well.  Otherwise has been in her usual state of health.  She is active and exercises.  She denies any rash.  Past Medical History:  Diagnosis Date  . Allergy   . Anxiety   . Atrophic vaginitis   . Cataract    hx/o surgery, both eyes; Dr. Gershon Crane  . Diverticulosis    per 2010 colonoscopy  . Dyslipidemia   . Full dentures   . GERD (gastroesophageal reflux disease)    hx/o, resolved  . Glaucoma 2016  . H/O echocardiogram 06/22/2010   normal LVEF, moderate mitral and tricuspid regurg 07/2016 [name];  mild aortic valve stenosis, left ventricular normal, EF 55%; Dr. Gwenlyn Found  06/2010  . History of cardiovascular stress test 06/22/2010   normal bruce myocadial perfusion study, 72% EF; Dr. Gwenlyn Found  . History  of mammogram    benign bilat calcifications, heterogeneously dense breasts, stable mammograms 2009-2014  . 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 Marti Sleigh gyencology oncology [place]; Dr. Lorriane Shire gynecology  . Hypertension   . Lactose intolerance   . 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 Rollene Fare  . Osteoporosis   . SVT (supraventricular tachycardia) ([place])    asymptomatic 2014, hx/o SVT, Dr. Rollene Fare  . Vitamin D deficiency    Current Outpatient Medications on File Prior to Visit  Medication Sig Dispense Refill  . alendronate (FOSAMAX) 70 MG tablet TAKE 1 TABLET BY MOUTH EVERY 7 DAYS WITH A FULL GLASS OF WATER AN EMPTY STOMACH 12 tablet 3  . aspirin 81 MG tablet Take 1 tablet (81 mg total) by mouth daily. 90 tablet 3  . atenolol (TENORMIN) 50 MG tablet Take 1 tablet (50 mg total) by mouth daily. 90 tablet 2  . calcium carbonate (CALCIUM 600) 600 MG TABS tablet Take 1 tablet (600 mg  total) by mouth 2 (two) times daily with a meal. 180 tablet 3  . cetirizine (ZYRTEC) 10 MG tablet TAKE 1 TABLET (10 MG TOTAL) BY MOUTH AT BEDTIME. 30 tablet 2  . Cholecalciferol (D3 HIGH POTENCY) 1000 units capsule Take 1 capsule (1,000 Units total) by mouth daily. 90 capsule 3  . latanoprost (XALATAN) 0.005 % ophthalmic solution 1 drop at bedtime.    Marland Kitchen lisinopril (PRINIVIL,ZESTRIL) 10 MG tablet Take 1 tablet (10 mg total) by mouth daily. 90 tablet 2  . simvastatin (ZOCOR) 40 MG tablet TAKE 1 TABLET BY MOUTH EVERYDAY AT BEDTIME 90 tablet 0   No current facility-administered medications on file prior to visit.    ROS as in subjective    Objective: BP (!) 160/100   Pulse 74   Temp 98.4 F (36.9 C) (Temporal)   Resp 16   Wt 156 lb 6.4 oz (70.9 kg)   SpO2 99%   BMI  28.61 kg/m   BP Readings from Last 3 Encounters:  06/11/19 (!) 160/100  02/09/19 139/83  11/20/18 120/80   Wt Readings from Last 3 Encounters:  06/11/19 156 lb 6.4 oz (70.9 kg)  02/09/19 155 lb 8 oz (70.5 kg)  11/20/18 156 lb 12.8 oz (71.1 kg)   General appearance: alert, no distress, WD/WN Neck: supple, nontender, normal range of motion, no lymphadenopathy, no thyromegaly, no masses Heart: RRR, normal S1, S2, no murmurs Chest wall: She has tenderness to palpation in one area of her left chest wall laterally around the 7th and 8th rib on the left, otherwise no tenderness, no bruising or erythema, no obvious rash.  Does not seem to be in pain with range of motion lungs: CTA bilaterally, no wheezes, rhonchi, or rales Abdomen: +bs, soft, non tender, non distended, no masses, no hepatomegaly, no splenomegaly Back nontender  pulses: 2+ symmetric, upper and lower extremities, normal cap refill Extremities: No edema   EKG  EKG was performed at triage today.  Normal sinus rhythm, rate 72 bpm, PR interval 144 ms, QRS 74 ms, QTC 4 4 4  ms, axis -22 degrees, T wave inversions in 2 and 3 as well as aVF, T wave inversions in V3, relatively flat T waves in V6 and V5, compared to prior EKGs there seems to be a new T wave inversion in aVF, new T wave inversion in 2 but the ST depression in 3 seems unchanged.  The QRS complex in V2 seems to be more upward compared to the more downward deflection and prior EKGs as well.   Assessment: Encounter Diagnoses  Name Primary?  . Chest wall pain Yes  . Muscle strain   . Abnormal EKG       Plan: We discussed her symptoms which suggest more of a muscle strain.  I advised she use some Tylenol over-the-counter for the next few days twice a day, she can do alternating heat and ice topically compresses for comfort which has been helping, stretching.  She request muscle relaxer.  We discussed the risk of this.  I will give her a few she can use short-term at  bedtime the next few nights but caution for sedation.  Advise she only uses at bedtime the next 3 nights and then she has some on hand sparingly.  We discussed the changes in the EKG findings.  Her symptoms began a week ago and seemed to be unrelated to a cardiac source but nevertheless since the EKG changes are new I will reach out to [name]  and let him know as he may want to see her soon instead of waiting for September.  Reviewed case with Dr. Tomi Bamberger, supervising physician  Ch[place]ion was seen today for pulled muscle.  Diagnoses and all orders for this visit:  Chest wall pain  Muscle strain  Abnormal EKG  Other orders -     cyclobenzaprine (FLEXERIL) 5 MG tablet; Take 1 tablet (5 mg total) by mouth at bedtime.

## 2019-06-12 ENCOUNTER — Telehealth: Payer: Self-pay | Admitting: Internal Medicine

## 2019-06-12 NOTE — Telephone Encounter (Signed)
LMTCB to discuss MD recommendations noted below

## 2019-06-12 NOTE — Telephone Encounter (Signed)
-----   Message from Pixie Casino, MD sent at 06/12/2019 10:45 AM EDT ----- Regarding: RE: needs earlier appt Yes .. before visit. ----- Message ----- From: Fidel Levy, RN Sent: 06/12/2019  10:37 AM EDT To: Pixie Casino, MD Subject: RE: needs earlier appt                         Need to move echo too? ----- Message ----- From: Pixie Casino, MD Sent: 06/12/2019   9:50 AM EDT To: Fidel Levy, RN Subject: needs earlier appt                             Saw PCP for atypical chest pain, but new EKG changes. Can you get an earlier appt with me or APP prior to September's scheduled appt for chest pain and EKG changes?  Thanks.

## 2019-06-12 NOTE — Telephone Encounter (Signed)
Thanks Audelia Acton -I agree, the T wave changes are new - even though it sounds non-cardiac, I may want to do some testing on her.  We'll reach out to get her in sooner.  -Mali

## 2019-06-16 ENCOUNTER — Other Ambulatory Visit: Payer: Self-pay | Admitting: Medical

## 2019-06-16 DIAGNOSIS — R0789 Other chest pain: Secondary | ICD-10-CM

## 2019-06-16 NOTE — Telephone Encounter (Signed)
Yes,  I put in order.  Have her go to [facility].  Cardiology should be contacting her for visit soon as well.

## 2019-06-16 NOTE — Telephone Encounter (Signed)
Pt stated she is still having the same pain in her chest wall and is now wondering if she can get chest x-ray. Please advise.

## 2019-06-16 NOTE — Telephone Encounter (Signed)
Patient notified to go get xray  

## 2019-06-17 ENCOUNTER — Ambulatory Visit
Admission: RE | Admit: 2019-06-17 | Discharge: 2019-06-17 | Disposition: A | Discharge status: Home / Self Care | Payer: Medicare Other | Source: Ambulatory Visit | Attending: Medical | Admitting: Medical

## 2019-06-17 ENCOUNTER — Other Ambulatory Visit: Payer: Self-pay

## 2019-06-17 DIAGNOSIS — R0789 Other chest pain: Secondary | ICD-10-CM

## 2019-06-18 NOTE — Telephone Encounter (Signed)
Spoke with patient about echo & f/up visit. She is OK with rescheduling to sooner date. Message sent to scheduler to move appointments

## 2019-06-22 ENCOUNTER — Other Ambulatory Visit: Payer: Self-pay | Admitting: Medical

## 2019-06-22 DIAGNOSIS — R9389 Abnormal findings on diagnostic imaging of other specified body structures: Secondary | ICD-10-CM

## 2019-06-22 DIAGNOSIS — J90 Pleural effusion, not elsewhere classified: Secondary | ICD-10-CM

## 2019-06-22 MED ORDER — ASPIRIN EC 325 MG PO TBEC: 325.0000 mg | DELAYED_RELEASE_TABLET | Freq: Every day | ORAL | 0 refills | Status: DC

## 2019-06-30 ENCOUNTER — Ambulatory Visit (HOSPITAL_COMMUNITY): Payer: Medicare Other | Attending: Cardiology

## 2019-06-30 ENCOUNTER — Other Ambulatory Visit: Payer: Self-pay

## 2019-06-30 DIAGNOSIS — R011 Cardiac murmur, unspecified: Secondary | ICD-10-CM | POA: Diagnosis not present

## 2019-07-02 ENCOUNTER — Other Ambulatory Visit: Payer: Self-pay | Admitting: Medical

## 2019-07-02 ENCOUNTER — Telehealth: Payer: Self-pay | Admitting: Physician Assistant

## 2019-07-02 NOTE — Telephone Encounter (Signed)

## 2019-07-03 ENCOUNTER — Other Ambulatory Visit: Payer: Self-pay

## 2019-07-03 ENCOUNTER — Ambulatory Visit: Payer: Medicare Other | Admitting: Physician Assistant

## 2019-07-03 ENCOUNTER — Encounter: Payer: Self-pay | Admitting: Physician Assistant

## 2019-07-03 VITALS — BP 126/72 | HR 82 | Ht 62.0 in | Wt 158.6 lb

## 2019-07-03 DIAGNOSIS — E785 Hyperlipidemia, unspecified: Secondary | ICD-10-CM | POA: Diagnosis not present

## 2019-07-03 DIAGNOSIS — R0789 Other chest pain: Secondary | ICD-10-CM | POA: Diagnosis not present

## 2019-07-03 DIAGNOSIS — R079 Chest pain, unspecified: Secondary | ICD-10-CM

## 2019-07-03 DIAGNOSIS — I471 Supraventricular tachycardia, unspecified: Secondary | ICD-10-CM

## 2019-07-03 DIAGNOSIS — I10 Essential (primary) hypertension: Secondary | ICD-10-CM

## 2019-07-03 MED ORDER — LISINOPRIL 10 MG PO TABS: 10.0000 mg | ORAL_TABLET | Freq: Every day | ORAL | 3 refills | Status: DC

## 2019-07-03 NOTE — Patient Instructions (Addendum)
Medication Instructions:  Your physician recommends that you continue on your current medications as directed. Please refer to the Current Medication list given to you today.  If you need a refill on your cardiac medications before your next appointment, please call your pharmacy.   Lab work: NONE ordered at this time of appointment   If you have labs (blood work) drawn today and your tests are completely normal, you will receive your results only by: Marland Kitchen MyChart Message (if you have MyChart) OR . A paper copy in the mail If you have any lab test that is abnormal or we need to change your treatment, we will call you to review the results.  Testing/Procedures: Your physician has requested that you have a lexiscan myoview. For further information please visit [email or web address]. Please follow instruction sheet, as given.   To be scheduled for 2-3 weeks   Follow-Up: At [facility], you and your health needs are our priority.  As part of our continuing mission to provide you with exceptional heart care, we have created designated Provider Care Teams.  These Care Teams include your primary Cardiologist (physician) and Advanced Practice Providers (APPs -  Physician Assistants and Nurse Practitioners) who all work together to provide you with the care you need, when you need it. You will need a follow up appointment in 3 months.  Please call our office 2 months in advance to schedule this appointment.  You may see Pixie Casino, MD or one of the following Advanced Practice Providers on your designated Care Team: [place], [place] . Fabian Sharp, PA-C  Any Other Special Instructions Will Be Listed Below (If Applicable).

## 2019-07-03 NOTE — Progress Notes (Signed)
Cardiology Office Note    Date:  07/05/2019   ID:  [name], DOB 02-Jul-1933, MRN [id number]  PCP:  Carlena Hurl, PA-C  Cardiologist:  [name]  Chief Complaint  Patient presents with  . Follow-up    seen for [name].     History of Present Illness:  [name] is a 85 y.o. female with PMH of HTN, SVT, hyperlipidemia, and LGL syndrome.  Patient was previously on flecainide however has been taken off of that.  She has no recurrence of palpitation on beta-blocker.  Myoview in 2011 was negative for ischemia.  Echocardiogram in 2014 showed normal systolic function with moderate MR.  Previous echocardiogram obtained in August 2017 showed EF 60 to 65%, moderate MR, moderate TR, PA peak pressure 39 mmHg.  Patient was last seen by [name] in September 2019 at which time she was doing well without any active complaints.  Patient was recently seen by her PCP on 07/11/2019 for chest pain which was more suggestive of muscle strain based on symptom, EKG however showed new T wave inversion in the inferior leads.  More recently, patient had a repeat echocardiogram on 06/30/2019 which revealed EF greater than 65%, mild LVH, normal RV EF, RV systolic pressure mildly elevated at 40.1 mmHg, mitral valve regurgitation was mild by color flow Doppler, mild TR.  Patient presents today for cardiology office visit.  Since discharge, she continued to have this left-sided flank pain under her armpit.  It does not occur with deep inspiration, body rotation or palpation.  However she she does notice this pain more when she is exerting herself.  It does not radiate.  I recommended a Lexiscan Myoview to further evaluate given the exertional component.  Otherwise she does not have any lower extremity edema, orthopnea or PND.  She does not have any significant heart murmur on physical exam either.   Past Medical History:  Diagnosis Date  . Allergy   . Anxiety   . Atrophic vaginitis   . Cataract     hx/o surgery, both eyes; Dr. Gershon Crane  . Diverticulosis    per 2010 colonoscopy  . Dyslipidemia   . Full dentures   . GERD (gastroesophageal reflux disease)    hx/o, resolved  . Glaucoma 2016  . H/O echocardiogram 06/22/2010   normal LVEF, moderate mitral and tricuspid regurg 07/2016 [name];  mild aortic valve stenosis, left ventricular normal, EF 55%; Dr. Gwenlyn Found  06/2010  . History of cardiovascular stress test 06/22/2010   normal bruce myocadial perfusion study, 72% EF; Dr. Gwenlyn Found  . History of mammogram    benign bilat calcifications, heterogeneously dense breasts, stable mammograms 2009-2014  . 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 Marti Sleigh gyencology [facility]; Dr. Lorriane Shire gynecology  . Hypertension   . Lactose intolerance   . 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 Rollene Fare  . Osteoporosis   . SVT (supraventricular tachycardia) ([place])    asymptomatic 2014, hx/o SVT, Dr. Rollene Fare  . Vitamin D deficiency     Past Surgical History:  Procedure Laterality Date  . ABDOMINAL HYSTERECTOMY Bilateral    h/o uterine cancer; oophrectomy  . CATARACT EXTRACTION     bilat  . COLONOSCOPY  08/2016   08/2016  Dr.  Hung advised no repeat colonoscopy;  scattered diverticla, medium hemorrhoids 2010; Dr. [facility] [place]  . NM MYOCAR PERF WALL MOTION    . TOTAL HIP ARTHROPLASTY Bilateral 2008, 2010    twice left (complication on the left, once on the right; Dr. Alphonzo Severance  . TRANSTHORACIC ECHOCARDIOGRAM  02/2013   ordered for SVT; EF 55-65%; calcified MV annulus, mod MR; RSVP increased; RA mildly dilated; mod TR; PA peak pressure 67mmHg    Current Medications: Outpatient Medications Prior to Visit  Medication Sig Dispense Refill  . alendronate (FOSAMAX) 70 MG tablet  TAKE 1 TABLET BY MOUTH EVERY 7 DAYS WITH A FULL GLASS OF WATER AN EMPTY STOMACH 12 tablet 3  . aspirin EC 325 MG tablet Take 1 tablet (325 mg total) by mouth daily. 30 tablet 0  . atenolol (TENORMIN) 50 MG tablet Take 1 tablet (50 mg total) by mouth daily. 90 tablet 2  . calcium carbonate (CALCIUM 600) 600 MG TABS tablet Take 1 tablet (600 mg total) by mouth 2 (two) times daily with a meal. 180 tablet 3  . cetirizine (ZYRTEC) 10 MG tablet TAKE 1 TABLET (10 MG TOTAL) BY MOUTH AT BEDTIME. 30 tablet 2  . Cholecalciferol (D3 HIGH POTENCY) 1000 units capsule Take 1 capsule (1,000 Units total) by mouth daily. 90 capsule 3  . latanoprost (XALATAN) 0.005 % ophthalmic solution 1 drop at bedtime.    . simvastatin (ZOCOR) 40 MG tablet TAKE 1 TABLET BY MOUTH EVERYDAY AT BEDTIME 90 tablet 0  . lisinopril (PRINIVIL,ZESTRIL) 10 MG tablet Take 1 tablet (10 mg total) by mouth daily. 90 tablet 2   No facility-administered medications prior to visit.      Allergies:   Patient has no known allergies.   Social History   Socioeconomic History  . Marital status: Married    Spouse name: Not on file  . Number of children: 1  . Years of education: Not on file  . Highest education level: Not on file  Occupational History  . Not on file  Social Needs  . Financial resource strain: Not on file  . Food insecurity    Worry: Not on file    Inability: Not on file  . Transportation needs    Medical: Not on file    Non-medical: Not on file  Tobacco Use  . Smoking status: Never Smoker  . Smokeless tobacco: Never Used  Substance and Sexual Activity  . Alcohol use: No  . Drug use: No  . Sexual activity: Never    Comment: walks 3x/wk, retired, married, 2 grandchildren  Lifestyle  . Physical activity    Days per week: Not on file    Minutes per session: Not on file  . Stress: Not on file  Relationships  . Social Herbalist on phone: Not on file    Gets together: Not on file    Attends religious  service: Not on file    Active member of club or organization: Not on file    Attends meetings of clubs or organizations: Not on file    Relationship status: Not on file  Other Topics Concern  . Not on file  Social History Narrative   Married, 1 son, 2 grandchildren; exercise 4 days per week - walking, mall and [employer].  Husband has hx/o CVA, but is ambulatory.  06/2017     Family History:  The patient's Family history is unknown by patient.   ROS:   Please see the history of  present illness.    ROS All other systems reviewed and are negative.   PHYSICAL EXAM:   VS:  BP 126/72   Pulse 82   Ht 5\' 2"  (1.575 m)   Wt 158 lb 9.6 oz (71.9 kg)   SpO2 99%   BMI 29.01 kg/m    GEN: Well nourished, well developed, in no acute distress  HEENT: normal  Neck: no JVD, carotid bruits, or masses Cardiac: RRR; no murmurs, rubs, or gallops,no edema  Respiratory:  clear to auscultation bilaterally, normal work of breathing GI: soft, nontender, nondistended, + BS MS: no deformity or atrophy  Skin: warm and dry, no rash Neuro:  Alert and Oriented x 3, Strength and sensation are intact Psych: euthymic mood, full affect  Wt Readings from Last 3 Encounters:  07/03/19 158 lb 9.6 oz (71.9 kg)  06/11/19 156 lb 6.4 oz (70.9 kg)  02/09/19 155 lb 8 oz (70.5 kg)      Studies/Labs Reviewed:   EKG:  EKG is not ordered today.    Recent Labs: 07/14/2018: TSH 2.690 02/09/2019: ALT 21; BUN 16; Creatinine 1.00; Hemoglobin 14.0; Platelets 182; Potassium 4.5; Sodium 143   Lipid Panel    Component Value Date/Time   CHOL 143 07/14/2018 0959   TRIG 140 07/14/2018 0959   HDL 43 07/14/2018 0959   CHOLHDL 3.3 07/14/2018 0959   CHOLHDL 3.3 07/11/2017 0912   VLDL 26 07/11/2017 0912   LDLCALC 72 07/14/2018 0959    Additional studies/ records that were reviewed today include:   Echo 07/31/2016 LV EF: 60% -   65% Study Conclusions  - Left ventricle: The cavity size was normal. Wall thickness was    normal. Systolic function was normal. The estimated ejection   fraction was in the range of 60% to 65%. Wall motion was normal;   there were no regional wall motion abnormalities. Left   ventricular diastolic function parameters were normal. - Mitral valve: There was moderate regurgitation. - Right atrium: The atrium was mildly dilated. - Tricuspid valve: There was moderate regurgitation. - Pulmonary arteries: Systolic pressure was mildly to moderately   increased. PA peak pressure: 39 mm Hg (S).  Impressions:  - Normal LV function   Mild - moderate pulmonary hypertension   Echo 06/30/2019 IMPRESSIONS    1. The left ventricle has hyperdynamic systolic function, with an ejection fraction of >65%. The cavity size was normal. There is mild concentric left ventricular hypertrophy. Left ventricular diastolic Doppler parameters are consistent with  pseudonormalization.  2. The right ventricle has normal systolic function. The cavity was normal. There is no increase in right ventricular wall thickness. Right ventricular systolic pressure is mildly elevated with an estimated pressure of 40.1 mmHg.  ASSESSMENT:    1. Chest pain of uncertain etiology   2. Essential hypertension   3. SVT (supraventricular tachycardia) ([place])   4. Hyperlipidemia, unspecified hyperlipidemia type      PLAN:  In order of problems listed above:  1. Chest pain: She does have pain under her left flank that tend to occur with exertion.  This does not occur with deep inspiration, body rotation or palpation.  I recommend a Lexiscan Myoview especially in the setting of new T wave inversion in the inferior lead on recent EKG.  2. Hypertension: Blood pressure stable on current therapy.  3. History of SVT: No recent palpitation  4. Hyperlipidemia: Continue Zocor    Medication Adjustments/Labs and Tests Ordered: Current medicines are reviewed at length with the patient  today.  Concerns regarding medicines are  outlined above.  Medication changes, Labs and Tests ordered today are listed in the Patient Instructions below. Patient Instructions  Medication Instructions:  Your physician recommends that you continue on your current medications as directed. Please refer to the Current Medication list given to you today.  If you need a refill on your cardiac medications before your next appointment, please call your pharmacy.   Lab work: NONE ordered at this time of appointment   If you have labs (blood work) drawn today and your tests are completely normal, you will receive your results only by: Marland Kitchen MyChart Message (if you have MyChart) OR . A paper copy in the mail If you have any lab test that is abnormal or we need to change your treatment, we will call you to review the results.  Testing/Procedures: Your physician has requested that you have a lexiscan myoview. For further information please visit [email or web address]. Please follow instruction sheet, as given.   To be scheduled for 2-3 weeks   Follow-Up: At [facility], you and your health needs are our priority.  As part of our continuing mission to provide you with exceptional heart care, we have created designated Provider Care Teams.  These Care Teams include your primary Cardiologist (physician) and Advanced Practice Providers (APPs -  Physician Assistants and Nurse Practitioners) who all work together to provide you with the care you need, when you need it. You will need a follow up appointment in 3 months.  Please call our office 2 months in advance to schedule this appointment.  You may see Pixie Casino, MD or one of the following Advanced Practice Providers on your designated Care Team: [place], [place] . Fabian Sharp, PA-C  Any Other Special Instructions Will Be Listed Below (If Applicable).       Hilbert Corrigan, [place]  07/05/2019 9:59 PM    [facility] [address], [place], [place]  [address] Phone: [phone]; Fax: [phone]

## 2019-07-05 ENCOUNTER — Encounter: Payer: Self-pay | Admitting: Physician Assistant

## 2019-07-13 ENCOUNTER — Ambulatory Visit
Admission: RE | Admit: 2019-07-13 | Discharge: 2019-07-13 | Disposition: A | Discharge status: Home / Self Care | Payer: Medicare Other | Source: Ambulatory Visit | Attending: Medical | Admitting: Medical

## 2019-07-13 DIAGNOSIS — R9389 Abnormal findings on diagnostic imaging of other specified body structures: Secondary | ICD-10-CM

## 2019-07-13 DIAGNOSIS — J90 Pleural effusion, not elsewhere classified: Secondary | ICD-10-CM

## 2019-07-14 ENCOUNTER — Other Ambulatory Visit: Payer: Self-pay | Admitting: Medical

## 2019-07-15 ENCOUNTER — Other Ambulatory Visit: Payer: Self-pay | Admitting: Medical

## 2019-07-15 DIAGNOSIS — R9389 Abnormal findings on diagnostic imaging of other specified body structures: Secondary | ICD-10-CM

## 2019-07-15 DIAGNOSIS — R9431 Abnormal electrocardiogram [ECG] [EKG]: Secondary | ICD-10-CM

## 2019-07-15 NOTE — Progress Notes (Signed)
Dg ch 

## 2019-07-16 ENCOUNTER — Telehealth (HOSPITAL_COMMUNITY): Payer: Self-pay | Admitting: *Deleted

## 2019-07-16 NOTE — Telephone Encounter (Signed)
Close encounter 

## 2019-07-21 ENCOUNTER — Ambulatory Visit (HOSPITAL_COMMUNITY)
Admission: RE | Admit: 2019-07-21 | Discharge: 2019-07-21 | Disposition: A | Discharge status: Home / Self Care | Payer: Medicare Other | Source: Ambulatory Visit | Attending: Cardiovascular Disease | Admitting: Cardiovascular Disease

## 2019-07-21 ENCOUNTER — Other Ambulatory Visit: Payer: Self-pay

## 2019-07-21 DIAGNOSIS — R0789 Other chest pain: Secondary | ICD-10-CM | POA: Diagnosis not present

## 2019-07-21 DIAGNOSIS — R079 Chest pain, unspecified: Secondary | ICD-10-CM

## 2019-07-21 LAB — MYOCARDIAL PERFUSION IMAGING
LV dias vol: 73 mL (ref 46–106)
LV sys vol: 35 mL
Peak HR: 86 {beats}/min
Rest HR: 60 {beats}/min
SDS: 7
SRS: 6
SSS: 13
TID: 1.11

## 2019-07-21 MED ORDER — TECHNETIUM TC 99M TETROFOSMIN IV KIT
10.8000 | PACK | Freq: Once | INTRAVENOUS | Status: AC | PRN
  Administered 2019-07-21: 10.8 via INTRAVENOUS
  Filled 2019-07-21: qty 11

## 2019-07-21 MED ORDER — REGADENOSON 0.4 MG/5ML IV SOLN
0.4000 mg | Freq: Once | INTRAVENOUS | Status: AC
  Administered 2019-07-21: 0.4 mg via INTRAVENOUS

## 2019-07-21 MED ORDER — TECHNETIUM TC 99M TETROFOSMIN IV KIT
31.0000 | PACK | Freq: Once | INTRAVENOUS | Status: AC | PRN
  Administered 2019-07-21: 31 via INTRAVENOUS
  Filled 2019-07-21: qty 31

## 2019-07-29 ENCOUNTER — Other Ambulatory Visit: Payer: Self-pay | Admitting: Medical

## 2019-08-12 ENCOUNTER — Encounter: Payer: Self-pay | Admitting: Medical

## 2019-08-12 ENCOUNTER — Other Ambulatory Visit: Payer: Self-pay

## 2019-08-12 ENCOUNTER — Ambulatory Visit: Payer: Medicare Other | Admitting: Medical

## 2019-08-12 VITALS — BP 120/80 | HR 63 | Temp 97.9°F | Resp 16 | Ht 62.0 in | Wt 153.2 lb

## 2019-08-12 DIAGNOSIS — I340 Nonrheumatic mitral (valve) insufficiency: Secondary | ICD-10-CM

## 2019-08-12 DIAGNOSIS — I700 Atherosclerosis of aorta: Secondary | ICD-10-CM

## 2019-08-12 DIAGNOSIS — Z0000 Encounter for general adult medical examination without abnormal findings: Secondary | ICD-10-CM | POA: Diagnosis not present

## 2019-08-12 DIAGNOSIS — E785 Hyperlipidemia, unspecified: Secondary | ICD-10-CM

## 2019-08-12 DIAGNOSIS — Z23 Encounter for immunization: Secondary | ICD-10-CM

## 2019-08-12 DIAGNOSIS — H409 Unspecified glaucoma: Secondary | ICD-10-CM

## 2019-08-12 DIAGNOSIS — I10 Essential (primary) hypertension: Secondary | ICD-10-CM

## 2019-08-12 DIAGNOSIS — Z7185 Encounter for immunization safety counseling: Secondary | ICD-10-CM

## 2019-08-12 DIAGNOSIS — I456 Pre-excitation syndrome: Secondary | ICD-10-CM

## 2019-08-12 DIAGNOSIS — E559 Vitamin D deficiency, unspecified: Secondary | ICD-10-CM | POA: Diagnosis not present

## 2019-08-12 DIAGNOSIS — M810 Age-related osteoporosis without current pathological fracture: Secondary | ICD-10-CM

## 2019-08-12 DIAGNOSIS — Z7189 Other specified counseling: Secondary | ICD-10-CM | POA: Diagnosis not present

## 2019-08-12 DIAGNOSIS — R7989 Other specified abnormal findings of blood chemistry: Secondary | ICD-10-CM

## 2019-08-12 DIAGNOSIS — R9389 Abnormal findings on diagnostic imaging of other specified body structures: Secondary | ICD-10-CM

## 2019-08-12 NOTE — Progress Notes (Signed)
Subjective:    [name] is a 85 y.o. female who presents for Preventative Services visit and chronic medical problems/med check visit.    Primary Care Provider Tysinger, Camelia Eng, PA-C here for primary care  Current Health Care Team: Dentist, Dr. Rolla Plate Eye doctor, Dr. Rogers Seeds Cardiology, Dr. Lyman Bishop Dr. Brunetta Genera, hematology oncology Dr. Magnus Sinning, phys med Dr. Alphonzo Severance, ortho   Medical Services you may have received from other than [facility] providers in the past year (date may be approximate) none  Exercise Current exercise habits: walking 3 days a week   Nutrition/Diet Current diet: not eating fried food or sweets  Depression Screen Depression screen [facility] 2/9 08/12/2019  Decreased Interest 0  Down, Depressed, Hopeless 0  PHQ - 2 Score 0    Activities of Daily Living Screen/Functional Status Survey Is the patient deaf or have difficulty hearing?: No Does the patient have difficulty seeing, even when wearing glasses/contacts?: No Does the patient have difficulty concentrating, remembering, or making decisions?: No Does the patient have difficulty walking or climbing stairs?: No Does the patient have difficulty dressing or bathing?: No Does the patient have difficulty doing errands alone such as visiting a doctor's office or shopping?: No  Can patient draw a clock face showing 3:15 oclock, yes  Fall Risk Screen Fall Risk  07/14/2018 07/11/2017 06/26/2016 06/14/2015  Falls in the past year? No No No No    Gait Assessment: Normal gait observed yes  Advanced directives Does patient have a [place]? Yes  Does patient have a Living Will? Yes   Past Medical History:  Diagnosis Date  . Allergy   . Anxiety   . Atrophic vaginitis   . Cataract    hx/o surgery, both eyes; Dr. Gershon Crane  . Diverticulosis    per 2010 colonoscopy  . Dyslipidemia   . Full dentures   . GERD (gastroesophageal reflux disease)    hx/o,  resolved  . Glaucoma 2016  . H/O echocardiogram 06/22/2010   normal LVEF, moderate mitral and tricuspid regurg 07/2016 [name];  mild aortic valve stenosis, left ventricular normal, EF 55%; Dr. Gwenlyn Found  06/2010  . History of cardiovascular stress test 06/22/2010   normal bruce myocadial perfusion study, 72% EF; Dr. Gwenlyn Found  . History of mammogram    benign bilat calcifications, heterogeneously dense breasts, stable mammograms 2009-2014  . 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 Marti Sleigh gyencology oncology [place]; Dr. Lorriane Shire gynecology  . Hypertension   . Lactose intolerance   . 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 Rollene Fare  . Osteoporosis   . SVT (supraventricular tachycardia) ([place])    asymptomatic 2014, hx/o SVT, Dr. Rollene Fare  . Vitamin D deficiency     Past Surgical History:  Procedure Laterality Date  . ABDOMINAL HYSTERECTOMY Bilateral    h/o uterine cancer; oophrectomy  . CATARACT EXTRACTION     bilat  . COLONOSCOPY  08/2016   08/2016  Dr. Benson Norway advised no repeat colonoscopy;  scattered diverticla, medium hemorrhoids 2010; Dr. Benson Norway  . NM MYOCAR PERF WALL MOTION    . TOTAL HIP ARTHROPLASTY Bilateral 2008, 2010    twice left (complication on the left, once on the right; Dr. Alphonzo Severance  . TRANSTHORACIC ECHOCARDIOGRAM  02/2013   ordered for SVT; EF 55-65%; calcified MV annulus, mod MR; RSVP increased; RA mildly dilated; mod TR; PA peak pressure 25mmHg    Social History   Socioeconomic History  . Marital status: Married    Spouse name: Not on file  . Number of children: 1  . Years of education: Not on file  . Highest education level: Not on file  Occupational History  . Not on file  Social Needs  . Financial resource strain: Not on file  .  Food insecurity    Worry: Not on file    Inability: Not on file  . Transportation needs    Medical: Not on file    Non-medical: Not on file  Tobacco Use  . Smoking status: Never Smoker  . Smokeless tobacco: Never Used  Substance and Sexual Activity  . Alcohol use: No  . Drug use: No  . Sexual activity: Never    Comment: walks 3x/wk, retired, married, 2 grandchildren  Lifestyle  . Physical activity    Days per week: Not on file    Minutes per session: Not on file  . Stress: Not on file  Relationships  . Social Herbalist on phone: Not on file    Gets together: Not on file    Attends religious service: Not on file    Active member of club or organization: Not on file    Attends meetings of clubs or organizations: Not on file    Relationship status: Not on file  . Intimate partner violence    Fear of current or ex partner: Not on file    Emotionally abused: Not on file    Physically abused: Not on file    Forced sexual activity: Not on file  Other Topics Concern  . Not on file  Social History Narrative   Married, 1 son, 2 grandchildren; exercise 4 days per week - walking, mall and [employer].  Husband has hx/o CVA, but is ambulatory.  06/2017    Family History  Family history unknown: Yes     Current Outpatient Medications:  .  alendronate (FOSAMAX) 70 MG tablet, TAKE 1 TABLET BY MOUTH EVERY 7 DAYS WITH A FULL GLASS OF WATER AN EMPTY STOMACH, Disp: 12 tablet, Rfl: 3 .  atenolol (TENORMIN) 50 MG tablet, Take 1 tablet (50 mg total) by mouth daily., Disp: 90 tablet, Rfl: 2 .  calcium carbonate (CALCIUM 600) 600 MG TABS tablet, Take 1 tablet (600 mg total) by mouth 2 (two) times daily with a meal., Disp: 180 tablet, Rfl: 3 .  cetirizine (ZYRTEC) 10 MG tablet, TAKE 1 TABLET BY MOUTH EVERYDAY AT BEDTIME, Disp: 90 tablet, Rfl: 0 .  Cholecalciferol (D3 HIGH POTENCY) 1000 units capsule, Take 1 capsule (1,000 Units total) by mouth daily., Disp: 90 capsule, Rfl: 3 .  latanoprost  (XALATAN) 0.005 % ophthalmic solution, 1 drop at bedtime., Disp: , Rfl:  .  lisinopril (ZESTRIL) 10 MG tablet, Take 1 tablet (10 mg total) by mouth daily., Disp: 90 tablet, Rfl: 3 .  simvastatin (ZOCOR) 40 MG tablet, TAKE 1 TABLET BY MOUTH EVERYDAY AT BEDTIME, Disp: 90 tablet, Rfl: 0  No Known Allergies  History reviewed: allergies, current medications, past family history, past medical history, past social history, past surgical history and problem list  Chronic issues discussed: Compliant with medications without c/o.  Acute issues discussed: none  Objective:      Biometrics BP 120/80   Pulse 63   Temp  97.9 F (36.6 C) (Oral)   Resp 16   Ht 5\' 2"  (1.575 m)   Wt 153 lb 3.2 oz (69.5 kg)   SpO2 98%   BMI 28.02 kg/m   Wt Readings from Last 3 Encounters:  08/12/19 153 lb 3.2 oz (69.5 kg)  07/21/19 158 lb (71.7 kg)  07/03/19 158 lb 9.6 oz (71.9 kg)    Cognitive Testing  Alert? Yes   Normal Appearance?yes   Oriented to person? Yes   Place? Yes    Time? Yes   Recall of three objects?  2 out of 3   Can perform simple calculations? Yes   Displays appropriate judgment?yes   Can read the correct time from a watch face?yes   General appearance: alert, no distress, WD/WN,AA female  Other pertinent exam: HEENT: normocephalic, sclerae anicteric, TMs pearly, nares patent, no discharge or erythema, pharynx normal Oral cavity: MMM, no lesions Neck: supple, no lymphadenopathy, no thyromegaly, no masses Heart: RRR, normal S1, S2, no murmurs Lungs: CTA bilaterally, no wheezes, rhonchi, or rales Abdomen: +bs, soft, non tender, non distended, no masses, no hepatomegaly, no splenomegaly Musculoskeletal: nontender, no swelling, no obvious deformity Extremities: no edema, no cyanosis, no clubbing Pulses: 2+ symmetric, upper and lower extremities, normal cap refill Neurological: alert, oriented x 3, CN2-12 intact, strength normal upper extremities and lower extremities, sensation  normal throughout, DTRs 2+ throughout, no cerebellar signs, gait normal Psychiatric: normal affect, behavior normal, pleasant  She declines breast and pelvic exam today  Assessment:   Encounter Diagnoses  Name Primary?  . Encounter for health maintenance examination in adult Yes  . Medicare annual wellness visit, subsequent   . Vaccine counseling   . Vitamin D deficiency   . Dyslipidemia   . Osteoporosis, unspecified osteoporosis type, unspecified pathological fracture presence   . Aortic atherosclerosis ([place])   . Essential hypertension   . Mitral valve insufficiency, unspecified etiology   . Lown Ganong Levine syndrome   . Abnormal chest x-ray   . Need for influenza vaccination   . Glaucoma, unspecified glaucoma type, unspecified laterality   . Elevated ferritin      Plan:   A preventative services visit was completed today.  During the course of the visit today, we discussed and counseled about appropriate screening and preventive services.  A health risk assessment was established today that included a review of current medications, allergies, social history, family history, medical and preventative health history, biometrics, and preventative screenings to identify potential safety concerns or impairments.  A personalized plan was printed today for your records and use.   Personalized health advice and education was given today to reduce health risks and promote self management and wellness.  Information regarding end of life planning was discussed today.  Conditions/risks identified: Abnormal chest xray  Chronic problems discussed today: Abnormal chest x-ray on follow-up chest x-ray last month.  Pending labs we will likely pursue chest CT versus just repeat chest x-ray.  We discussed the pros and cons of each and findings on last x-ray  Continue current medications for blood pressure, cholesterol  Change back to aspirin 81 mg daily.  We had her do elevated aspirin  short-term for chest wall pain.  She has follow-up planned with cardiology to review stress test.  Osteoporosis-compliant with alendronate weekly, reviewed bone density scan from 2019  Elevated ferritin-reviewed hematology notes from 08/11/2018, they advise she avoid cooking in a cast iron skillet, avoid alcohol and iron supplements.  They were going to monitor  her iron and ferritin for 6 months and then discharge back to PCP if stable.  Her labs over the course of the last year have been stable.  Glaucoma-managed by ophthalmology  Acute problems discussed today: None  Cancer screens: Continue yearly mammograms  Cologuard from 2019 reviewed  She is status post hysterectomy    Recommendations:  I recommend a yearly ophthalmology/optometry visit for glaucoma screening and eye checkup  I recommended a yearly dental visit for hygiene and checkup  Advanced directives - discussed nature and purpose of Advanced Directives, encouraged them to complete them if they have not done so and/or encouraged them to get [place] a copy if they have done this already.  Referrals today: none  Immunizations: Counseled on the influenza virus vaccine.  Vaccine information sheet given.   High dose Influenza vaccine given after consent obtained.  Advise she check insurance coverage for shingles and tetanus vaccines as she is due for both  Makenize was seen today for awv/cpe.  Diagnoses and all orders for this visit:  Encounter for health maintenance examination in adult -     Comprehensive metabolic panel -     CBC with Differential/Platelet -     VITAMIN D 25 Hydroxy (Vit-D Deficiency, Fractures) -     Ferritin -     Lipid panel  Medicare annual wellness visit, subsequent  Vaccine counseling  Vitamin D deficiency -     VITAMIN D 25 Hydroxy (Vit-D Deficiency, Fractures)  Dyslipidemia -     Lipid panel  Osteoporosis, unspecified osteoporosis type, unspecified pathological fracture presence   Aortic atherosclerosis ([facility])  Essential hypertension  Mitral valve insufficiency, unspecified etiology  Lown Ganong Levine syndrome  Abnormal chest x-ray -     DG Chest 2 View; Future  Need for influenza vaccination -     Flu Vaccine QUAD High Dose(Fluad)  Glaucoma, unspecified glaucoma type, unspecified laterality  Elevated ferritin    Medicare Attestation A preventative services visit was completed today.  During the course of the visit the patient was educated and counseled about appropriate screening and preventive services.  A health risk assessment was established with the patient that included a review of current medications, allergies, social history, family history, medical and preventative health history, biometrics, and preventative screenings to identify potential safety concerns or impairments.  A personalized plan was printed today for the patient's records and use.   Personalized health advice and education was given today to reduce health risks and promote self management and wellness.  Information regarding end of life planning was discussed today.  Dorothea Ogle, PA-C   08/12/2019

## 2019-08-12 NOTE — Patient Instructions (Addendum)
Current Health Care Team: Dentist, Dr. Rolla Plate Eye doctor, Dr. Rogers Seeds Cardiology, Dr. Lyman Bishop Dr. Brunetta Genera, hematology oncology Dr. Magnus Sinning, phys med Dr. Alphonzo Severance, ortho Inita Uram, Camelia Eng, PA-C primary care   See your eye doctor and dentist regularly  Follow-up with cardiology as planned to discuss stress test  We will plan to recheck chest x-ray in the next 30 days pending lab results.  This is a follow-up from the prior abnormal x-ray a month ago  Continue to get regular exercise, eat a healthy low-fat diet  Continue your current medications  If you have not given [place] a copy of your healthcare power of attorney and living will, please do this  Please call your insurer and check on coverage for tetanus vaccine and shingles vaccine.  You are due for both of these.   We updated your flu shot today  Get your mammogram yearly  You will be due for repeat bone density scan next year 2021   Your colon cancer screen called the Cologuard was negative this past year.       Health Maintenance After Age 41 After age 60, you are at a higher risk for certain long-term diseases and infections as well as injuries from falls. Falls are a major cause of broken bones and head injuries in people who are older than age 30. Getting regular preventive care can help to keep you healthy and well. Preventive care includes getting regular testing and making lifestyle changes as recommended by your health care provider. Talk with your health care provider about:  Which screenings and tests you should have. A screening is a test that checks for a disease when you have no symptoms.  A diet and exercise plan that is right for you. What should I know about screenings and tests to prevent falls? Screening and testing are the best ways to find a health problem early. Early diagnosis and treatment give you the best chance of managing medical conditions that are common after age  54. Certain conditions and lifestyle choices may make you more likely to have a fall. Your health care provider may recommend:  Regular vision checks. Poor vision and conditions such as cataracts can make you more likely to have a fall. If you wear glasses, make sure to get your prescription updated if your vision changes.  Medicine review. Work with your health care provider to regularly review all of the medicines you are taking, including over-the-counter medicines. Ask your health care provider about any side effects that may make you more likely to have a fall. Tell your health care provider if any medicines that you take make you feel dizzy or sleepy.  Osteoporosis screening. Osteoporosis is a condition that causes the bones to get weaker. This can make the bones weak and cause them to break more easily.  Blood pressure screening. Blood pressure changes and medicines to control blood pressure can make you feel dizzy.  Strength and balance checks. Your health care provider may recommend certain tests to check your strength and balance while standing, walking, or changing positions.  Foot health exam. Foot pain and numbness, as well as not wearing proper footwear, can make you more likely to have a fall.  Depression screening. You may be more likely to have a fall if you have a fear of falling, feel emotionally low, or feel unable to do activities that you used to do.  Alcohol use screening. Using too much alcohol can affect  your balance and may make you more likely to have a fall. What actions can I take to lower my risk of falls? General instructions  Talk with your health care provider about your risks for falling. Tell your health care provider if: ? You fall. Be sure to tell your health care provider about all falls, even ones that seem minor. ? You feel dizzy, sleepy, or off-balance.  Take over-the-counter and prescription medicines only as told by your health care provider. These  include any supplements.  Eat a healthy diet and maintain a healthy weight. A healthy diet includes low-fat dairy products, low-fat (lean) meats, and fiber from whole grains, beans, and lots of fruits and vegetables. Home safety  Remove any tripping hazards, such as rugs, cords, and clutter.  Install safety equipment such as grab bars in bathrooms and safety rails on stairs.  Keep rooms and walkways well-lit. Activity   Follow a regular exercise program to stay fit. This will help you maintain your balance. Ask your health care provider what types of exercise are appropriate for you.  If you need a cane or walker, use it as recommended by your health care provider.  Wear supportive shoes that have nonskid soles. Lifestyle  Do not drink alcohol if your health care provider tells you not to drink.  If you drink alcohol, limit how much you have: ? 0-1 drink a day for women. ? 0-2 drinks a day for men.  Be aware of how much alcohol is in your drink. In the [place] one drink equals one typical bottle of beer (12 oz), one-half glass of wine (5 oz), or one shot of hard liquor (1 oz).  Do not use any products that contain nicotine or tobacco, such as cigarettes and e-cigarettes. If you need help quitting, ask your health care provider. Summary  Having a healthy lifestyle and getting preventive care can help to protect your health and wellness after age 69.  Screening and testing are the best way to find a health problem early and help you avoid having a fall. Early diagnosis and treatment give you the best chance for managing medical conditions that are more common for people who are older than age 33.  Falls are a major cause of broken bones and head injuries in people who are older than age 84. Take precautions to prevent a fall at home.  Work with your health care provider to learn what changes you can make to improve your health and wellness and to prevent falls. This information is  not intended to replace advice given to you by your health care provider. Make sure you discuss any questions you have with your health care provider. Document Released: 10/16/2017 Document Revised: 03/26/2019 Document Reviewed: 10/16/2017 Elsevier Patient Education  2020 [employer].

## 2019-08-13 ENCOUNTER — Telehealth: Payer: Self-pay

## 2019-08-13 LAB — COMPREHENSIVE METABOLIC PANEL
ALT: 15 IU/L (ref 0–32)
AST: 25 IU/L (ref 0–40)
Albumin/Globulin Ratio: 1.1 — ABNORMAL LOW (ref 1.2–2.2)
Albumin: 4.1 g/dL (ref 3.6–4.6)
Alkaline Phosphatase: 61 IU/L (ref 39–117)
BUN/Creatinine Ratio: 19 (ref 12–28)
BUN: 21 mg/dL (ref 8–27)
Bilirubin Total: 0.8 mg/dL (ref 0.0–1.2)
CO2: 24 mmol/L (ref 20–29)
Calcium: 9.9 mg/dL (ref 8.7–10.3)
Chloride: 102 mmol/L (ref 96–106)
Creatinine, Ser: 1.11 mg/dL — ABNORMAL HIGH (ref 0.57–1.00)
GFR calc Af Amer: 53 mL/min/{1.73_m2} — ABNORMAL LOW (ref >59)
GFR calc non Af Amer: 46 mL/min/{1.73_m2} — ABNORMAL LOW (ref >59)
Globulin, Total: 3.6 g/dL (ref 1.5–4.5)
Glucose: 89 mg/dL (ref 65–99)
Potassium: 4.9 mmol/L (ref 3.5–5.2)
Sodium: 140 mmol/L (ref 134–144)
Total Protein: 7.7 g/dL (ref 6.0–8.5)

## 2019-08-13 LAB — LIPID PANEL
Chol/HDL Ratio: 3.5 ratio (ref 0.0–4.4)
Cholesterol, Total: 158 mg/dL (ref 100–199)
HDL: 45 mg/dL (ref >39)
LDL Calculated: 86 mg/dL (ref 0–99)
Triglycerides: 137 mg/dL (ref 0–149)
VLDL Cholesterol Cal: 27 mg/dL (ref 5–40)

## 2019-08-13 LAB — CBC WITH DIFFERENTIAL/PLATELET
Basophils Absolute: 0 10*3/uL (ref 0.0–0.2)
Basos: 1 %
EOS (ABSOLUTE): 0.1 10*3/uL (ref 0.0–0.4)
Eos: 2 %
Hematocrit: 42.4 % (ref 34.0–46.6)
Hemoglobin: 13.5 g/dL (ref 11.1–15.9)
Immature Grans (Abs): 0 10*3/uL (ref 0.0–0.1)
Immature Granulocytes: 0 %
Lymphocytes Absolute: 1.6 10*3/uL (ref 0.7–3.1)
Lymphs: 38 %
MCH: 28.7 pg (ref 26.6–33.0)
MCHC: 31.8 g/dL (ref 31.5–35.7)
MCV: 90 fL (ref 79–97)
Monocytes Absolute: 0.5 10*3/uL (ref 0.1–0.9)
Monocytes: 12 %
Neutrophils Absolute: 2 10*3/uL (ref 1.4–7.0)
Neutrophils: 47 %
Platelets: 184 10*3/uL (ref 150–450)
RBC: 4.7 x10E6/uL (ref 3.77–5.28)
RDW: 13.6 % (ref 11.7–15.4)
WBC: 4.3 10*3/uL (ref 3.4–10.8)

## 2019-08-13 LAB — FERRITIN: Ferritin: 330 ng/mL — ABNORMAL HIGH (ref 15–150)

## 2019-08-13 LAB — VITAMIN D 25 HYDROXY (VIT D DEFICIENCY, FRACTURES): Vit D, 25-Hydroxy: 46.9 ng/mL (ref 30.0–100.0)

## 2019-08-13 NOTE — Telephone Encounter (Signed)
Left a detailed message for the patient about moving her upcoming appointment to 08/31/19 or 09/01/19 for Almyra Deforest, PA-C will be virtual visits on 08/17/19 and he would like to see her in office and to give our office a call.

## 2019-08-14 ENCOUNTER — Other Ambulatory Visit: Payer: Self-pay | Admitting: Medical

## 2019-08-14 MED ORDER — ATENOLOL 50 MG PO TABS: 50.0000 mg | ORAL_TABLET | Freq: Every day | ORAL | 3 refills | Status: DC

## 2019-08-14 MED ORDER — D3 HIGH POTENCY 25 MCG (1000 UT) PO CAPS: 1000.0000 [IU] | ORAL_CAPSULE | Freq: Every day | ORAL | 3 refills | Status: DC

## 2019-08-14 MED ORDER — SIMVASTATIN 40 MG PO TABS: ORAL_TABLET | ORAL | 3 refills | Status: DC

## 2019-08-14 MED ORDER — CALCIUM CARBONATE 600 MG PO TABS: 600.0000 mg | ORAL_TABLET | Freq: Two times a day (BID) | ORAL | 3 refills | Status: DC

## 2019-08-14 MED ORDER — ALENDRONATE SODIUM 70 MG PO TABS: ORAL_TABLET | ORAL | 3 refills | Status: DC

## 2019-08-17 ENCOUNTER — Other Ambulatory Visit: Payer: Self-pay | Admitting: Medical

## 2019-08-17 ENCOUNTER — Ambulatory Visit: Payer: Medicare Other | Admitting: Physician Assistant

## 2019-08-17 DIAGNOSIS — R9389 Abnormal findings on diagnostic imaging of other specified body structures: Secondary | ICD-10-CM

## 2019-08-25 ENCOUNTER — Other Ambulatory Visit (HOSPITAL_COMMUNITY): Payer: Medicare Other

## 2019-08-31 ENCOUNTER — Encounter: Payer: Self-pay | Admitting: General Practice

## 2019-08-31 ENCOUNTER — Ambulatory Visit (INDEPENDENT_AMBULATORY_CARE_PROVIDER_SITE_OTHER): Payer: Medicare Other | Admitting: General Practice

## 2019-08-31 ENCOUNTER — Other Ambulatory Visit: Payer: Self-pay

## 2019-08-31 VITALS — BP 117/68 | HR 70 | Temp 96.4°F | Ht 62.0 in | Wt 153.6 lb

## 2019-08-31 DIAGNOSIS — E785 Hyperlipidemia, unspecified: Secondary | ICD-10-CM | POA: Diagnosis not present

## 2019-08-31 DIAGNOSIS — I10 Essential (primary) hypertension: Secondary | ICD-10-CM | POA: Diagnosis not present

## 2019-08-31 DIAGNOSIS — R0789 Other chest pain: Secondary | ICD-10-CM | POA: Diagnosis not present

## 2019-08-31 DIAGNOSIS — R079 Chest pain, unspecified: Secondary | ICD-10-CM

## 2019-08-31 NOTE — Progress Notes (Signed)
Cardiology Clinic Note   Patient Name: [name] Date of Encounter: 08/31/2019  Primary Care Provider:  Carlena Hurl, PA-C Primary Cardiologist:  Pixie Casino, MD  Patient Profile    [name] 85 year old female presents today for follow-up of her coronary artery disease and to review stress test results.  Past Medical History    Past Medical History:  Diagnosis Date  . Allergy   . Anxiety   . Atrophic vaginitis   . Cataract    hx/o surgery, both eyes; Dr. Gershon Crane  . Diverticulosis    per 2010 colonoscopy  . Dyslipidemia   . Full dentures   . GERD (gastroesophageal reflux disease)    hx/o, resolved  . Glaucoma 2016  . H/O echocardiogram 06/22/2010   normal LVEF, moderate mitral and tricuspid regurg 07/2016 [name];  mild aortic valve stenosis, left ventricular normal, EF 55%; Dr. Gwenlyn Found  06/2010  . History of cardiovascular stress test 06/22/2010   normal bruce myocadial perfusion study, 72% EF; Dr. Gwenlyn Found  . History of mammogram    benign bilat calcifications, heterogeneously dense breasts, stable mammograms 2009-2014  . 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 Marti Sleigh gyencology oncology [place]; Dr. Lorriane Shire gynecology  . Hypertension   . Lactose intolerance   . 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 Rollene Fare  . Osteoporosis   . SVT (supraventricular tachycardia) ([place])    asymptomatic 2014, hx/o SVT, Dr. Rollene Fare  . Vitamin D deficiency    Past Surgical History:  Procedure Laterality Date  . ABDOMINAL HYSTERECTOMY Bilateral    h/o uterine cancer; oophrectomy  . CATARACT EXTRACTION     bilat  . COLONOSCOPY  08/2016   08/2016  Dr. Benson Norway advised no repeat colonoscopy;  scattered diverticla, medium hemorrhoids  2010; Dr. Benson Norway  . NM MYOCAR PERF WALL MOTION    . TOTAL HIP ARTHROPLASTY Bilateral 2008, 2010    twice left (complication on the left, once on the right; Dr. Alphonzo Severance  . TRANSTHORACIC ECHOCARDIOGRAM  02/2013   ordered for SVT; EF 55-65%; calcified MV annulus, mod MR; RSVP increased; RA mildly dilated; mod TR; PA peak pressure 49mmHg    Allergies  No Known Allergies  History of Present Illness    [name] was last seen by Almyra Deforest PA-C on 07/03/2019.  During that time she was having continued left-sided flank pain under her armpit.  The pain was not reproduced with deep inspiration, body rotation or palpitation.  She noticed an increase in the pain with exertion.  A Lexiscan Myoview was ordered and was a low risk study.  Her echocardiogram from 06/30/2019 showed hyperdynamic systolic function greater than 123456, diastolic parameters consistent with pseudonormalization, right ventricular systolic pressure mildly elevated at 40.1 mmHg.  Her PMH also includes hypertension, SVT, hyperlipidemia, and LGL syndrome.  She was previously on flecainide but has subsequently been taken off.  She has had no further episodes of palpitations on her beta-blocker.  Her Myoview in 2011 was negative for ischemia.  Echocardiogram in 2014 showed normal systolic function with moderate MR, and a previous echocardiogram obtained August 2017 showed EF 60 to 65%, moderate mitral regurgitation, moderate TR, PA peak pressures 39 mmHg.  She was seen by her PCP on 07/11/2019 for chest  pain which was viewed as muscular in nature based on her symptoms however, her EKG showed new T wave inversion in inferior leads.  She presents to the clinic today for repeat assessment and stress test review.  She states that she is having less chest discomfort today and that it is almost gone.  She has resumed her normal daily activities.  And is walking for an hour 7 days a week.  She states she is retired but enjoys helping care for her 2  grandchildren.  She denies chest pain, shortness of breath, lower extremity edema, fatigue, palpitations, melena, hematuria, hemoptysis, diaphoresis, weakness, presyncope, syncope, orthopnea, and PND.   Home Medications    Prior to Admission medications   Medication Sig Start Date End Date Taking? Authorizing Provider  alendronate (FOSAMAX) 70 MG tablet TAKE 1 TABLET BY MOUTH EVERY 7 DAYS WITH A FULL GLASS OF WATER AN EMPTY STOMACH 08/14/19   Tysinger, Camelia Eng, PA-C  atenolol (TENORMIN) 50 MG tablet Take 1 tablet (50 mg total) by mouth daily. 08/14/19   Tysinger, Camelia Eng, PA-C  calcium carbonate (CALCIUM 600) 600 MG TABS tablet Take 1 tablet (600 mg total) by mouth 2 (two) times daily with a meal. 08/14/19   Tysinger, Camelia Eng, PA-C  cetirizine (ZYRTEC) 10 MG tablet TAKE 1 TABLET BY MOUTH EVERYDAY AT BEDTIME 07/29/19   Tysinger, Camelia Eng, PA-C  Cholecalciferol (D3 HIGH POTENCY) 25 MCG (1000 UT) capsule Take 1 capsule (1,000 Units total) by mouth daily. 08/14/19   Tysinger, Camelia Eng, PA-C  latanoprost (XALATAN) 0.005 % ophthalmic solution 1 drop at bedtime.    [provider]  lisinopril (ZESTRIL) 10 MG tablet Take 1 tablet (10 mg total) by mouth daily. 07/03/19   Almyra Deforest, PA  simvastatin (ZOCOR) 40 MG tablet TAKE 1 TABLET BY MOUTH EVERYDAY AT BEDTIME 08/14/19   Tysinger, Camelia Eng, PA-C    Family History    Family History  Family history unknown: Yes   She indicated that her mother is deceased. She indicated that her father is deceased.  Social History    Social History   Socioeconomic History  . Marital status: Married    Spouse name: Not on file  . Number of children: 1  . Years of education: Not on file  . Highest education level: Not on file  Occupational History  . Not on file  Social Needs  . Financial resource strain: Not on file  . Food insecurity    Worry: Not on file    Inability: Not on file  . Transportation needs    Medical: Not on file    Non-medical: Not on  file  Tobacco Use  . Smoking status: Never Smoker  . Smokeless tobacco: Never Used  Substance and Sexual Activity  . Alcohol use: No  . Drug use: No  . Sexual activity: Never    Comment: walks 3x/wk, retired, married, 2 grandchildren  Lifestyle  . Physical activity    Days per week: Not on file    Minutes per session: Not on file  . Stress: Not on file  Relationships  . Social Herbalist on phone: Not on file    Gets together: Not on file    Attends religious service: Not on file    Active member of club or organization: Not on file    Attends meetings of clubs or organizations: Not on file    Relationship status: Not on file  . Intimate partner violence  Fear of current or ex partner: Not on file    Emotionally abused: Not on file    Physically abused: Not on file    Forced sexual activity: Not on file  Other Topics Concern  . Not on file  Social History Narrative   Married, 1 son, 2 grandchildren; exercise 4 days per week - walking, mall and [employer].  Husband has hx/o CVA, but is ambulatory.  06/2017     Review of Systems    General:  No chills, fever, night sweats or weight changes.  Cardiovascular:  No chest pain, dyspnea on exertion, edema, orthopnea, palpitations, paroxysmal nocturnal dyspnea. Dermatological: No rash, lesions/masses Respiratory: No cough, dyspnea Urologic: No hematuria, dysuria Abdominal:   No nausea, vomiting, diarrhea, bright red blood per rectum, melena, or hematemesis Neurologic:  No visual changes, wkns, changes in mental status. All other systems reviewed and are otherwise negative except as noted above.  Physical Exam    VS:  Pulse 70   Temp (!) 96.4 F (35.8 C) (Temporal)   Ht 5\' 2"  (1.575 m)   Wt 153 lb 9.6 oz (69.7 kg)   SpO2 99%   BMI 28.09 kg/m  , BMI Body mass index is 28.09 kg/m. GEN: Well nourished, well developed, in no acute distress. HEENT: normal. Neck: Supple, no JVD, carotid bruits, or masses. Cardiac: RRR,  no murmurs, rubs, or gallops. No clubbing, cyanosis, edema.  Radials/DP/PT 2+ and equal bilaterally.  Respiratory:  Respirations regular and unlabored, clear to auscultation bilaterally. GI: Soft, nontender, nondistended, BS + x 4. MS: no deformity or atrophy. Skin: warm and dry, no rash. Neuro:  Strength and sensation are intact. Psych: Normal affect.  Accessory Clinical Findings    ECG personally reviewed by me today-none today  EKG 06/11/2019 Sinus rhythm with new changes in lead II, aVF, and V2 compared to 2019 EKG  EKG 09/05/2018 Sinus rhythm with premature atrial complexes 60 bpm   Myocardial perfusion study 07/21/2019  The left ventricular ejection fraction is mildly decreased (45-54%).  Nuclear stress EF: 52%.  There was no ST segment deviation noted during stress.  Findings consistent with ischemia.  This is a low risk study.   Mildly abnormal stress nuclear study with mild inferobasal ischemia; EF 52 with normal wall motion.  Assessment & Plan   1.  Chest pain of uncertain etiology-no chest pain today myocardial perfusion study 07/21/2019 EF 52%, low risk study Continue simvastatin 40 mg tablet daily Continue atenolol 50 mg daily Continue lisinopril 10 mg tablet daily Increase physical activity as tolerated Heart healthy low-sodium diet  2.  Essential hypertension- 117/68.  Well controlled at home Continue atenolol 50 mg daily Continue lisinopril 10 mg tablet daily Increase physical activity as tolerated Heart healthy low-sodium diet  3.  Hyperlipidemia-08/12/2019: Cholesterol, Total 158; HDL 45; LDL Calculated 86; Triglycerides 137 Continue simvastatin 40 mg tablet daily Increase physical activity as tolerated Heart healthy low-sodium diet  Myocardial perfusion study and echocardiogram results reviewed with patient for more than 20 minutes.  She expressed understanding no further questions at this time.  Disposition: Follow-up with [name] in 61 months     Deberah Pelton, NP-C 08/31/2019, 5:03 PM

## 2019-08-31 NOTE — Patient Instructions (Signed)
Medication Instructions:  Your physician recommends that you continue on your current medications as directed. Please refer to the Current Medication list given to you today.  If you need a refill on your cardiac medications before your next appointment, please call your pharmacy.   Lab work: NONE ordered at this time of appointment   If you have labs (blood work) drawn today and your tests are completely normal, you will receive your results only by: Marland Kitchen MyChart Message (if you have MyChart) OR . A paper copy in the mail If you have any lab test that is abnormal or we need to change your treatment, we will call you to review the results.  Testing/Procedures: NONE ordered at this time of appointment   Follow-Up: At [facility], you and your health needs are our priority.  As part of our continuing mission to provide you with exceptional heart care, we have created designated Provider Care Teams.  These Care Teams include your primary Cardiologist (physician) and Advanced Practice Providers (APPs -  Physician Assistants and Nurse Practitioners) who all work together to provide you with the care you need, when you need it. You will need a follow up appointment in 12 months September 2021.  Please call our office in July 2021 to schedule this appointment.  You may see Pixie Casino, MD or one of the following Advanced Practice Providers on your designated Care Team: [place], [place] . Fabian Sharp, PA-C  Any Other Special Instructions Will Be Listed Below (If Applicable).

## 2019-09-02 ENCOUNTER — Ambulatory Visit: Payer: Medicare Other | Admitting: Internal Medicine

## 2019-09-07 ENCOUNTER — Other Ambulatory Visit (INDEPENDENT_AMBULATORY_CARE_PROVIDER_SITE_OTHER): Payer: Self-pay | Admitting: Orthopedic Surgery

## 2019-09-11 ENCOUNTER — Ambulatory Visit
Admission: RE | Admit: 2019-09-11 | Discharge: 2019-09-11 | Disposition: A | Discharge status: Home / Self Care | Payer: Medicare Other | Source: Ambulatory Visit | Attending: Medical | Admitting: Medical

## 2019-09-11 DIAGNOSIS — R9389 Abnormal findings on diagnostic imaging of other specified body structures: Secondary | ICD-10-CM

## 2019-09-11 MED ORDER — IOPAMIDOL (ISOVUE-300) INJECTION 61%
75.0000 mL | Freq: Once | INTRAVENOUS | Status: AC | PRN
  Administered 2019-09-11: 75 mL via INTRAVENOUS

## 2019-09-23 ENCOUNTER — Other Ambulatory Visit: Payer: Self-pay | Admitting: Medical

## 2019-09-23 DIAGNOSIS — R0789 Other chest pain: Secondary | ICD-10-CM

## 2019-09-23 DIAGNOSIS — R911 Solitary pulmonary nodule: Secondary | ICD-10-CM

## 2019-09-23 DIAGNOSIS — R9389 Abnormal findings on diagnostic imaging of other specified body structures: Secondary | ICD-10-CM

## 2019-10-01 ENCOUNTER — Ambulatory Visit: Payer: Medicare Other | Admitting: Internal Medicine

## 2019-10-13 ENCOUNTER — Telehealth: Payer: Self-pay

## 2019-10-13 NOTE — Telephone Encounter (Signed)
Please call her back.  I apologize if there was confusion or if she is upset.  After her last scan since there continued to be a question mark on the findings, I wanted her to see the lung doctor, pulmonology instead of ordering yet another test.  So after her last scan I had advised referral to pulmonology and not a scan.  So I would still like her to do that and let them help decide how we move forward on monitoring the abnormal lung finding.  If agreeable please refer to pulmonology.  I do not have an active order for another scan at this time as I think she needs to see pulmonology to get their opinion on this finding from the prior abnormal CT and x-ray

## 2019-10-13 NOTE — Telephone Encounter (Signed)
Patient called and stated she has not been scheduled for her test. And she called her insurance company and they told her it should not take that long to taker her test again. Please advise so I can call patient as I am not sure which test she is referring to.

## 2019-10-13 NOTE — Telephone Encounter (Signed)
Patient has been scheduled with [facility] 10/15/19 @9 :15

## 2019-10-15 ENCOUNTER — Ambulatory Visit: Payer: Medicare Other | Admitting: Pulmonary Disease

## 2019-10-15 ENCOUNTER — Telehealth: Payer: Self-pay | Admitting: Medical

## 2019-10-15 ENCOUNTER — Encounter: Payer: Self-pay | Admitting: Pulmonary Disease

## 2019-10-15 ENCOUNTER — Other Ambulatory Visit: Payer: Self-pay

## 2019-10-15 VITALS — BP 134/80 | HR 58 | Temp 96.9°F | Ht 61.0 in | Wt 153.6 lb

## 2019-10-15 DIAGNOSIS — R0789 Other chest pain: Secondary | ICD-10-CM | POA: Diagnosis not present

## 2019-10-15 DIAGNOSIS — R911 Solitary pulmonary nodule: Secondary | ICD-10-CM | POA: Diagnosis not present

## 2019-10-15 NOTE — Telephone Encounter (Signed)
Please let her know that I reviewed the pulmonology note from today.  I am glad we sent her to pulmonology for further evaluation as I did not think just repeating x-rays and repeating CT scan was going to be the best plan of attack.  I am not sure if she fell upset with me about not ordering of the test but it did not make sense to keep testing when we really needed a specialist point of view  Again, my goal is always to look out for the best course of action and the best patient care.  I hope she feels like we are providing her with appropriate care.

## 2019-10-15 NOTE — Telephone Encounter (Signed)
Patient thanks [place] for all that we did. Stated she is a cancer survivor so she gets fearful sometimes when she don't know what's going on but she appreciates [place].

## 2019-10-15 NOTE — Patient Instructions (Addendum)
Thank you for visiting Dr. Valeta Harms at [facility]. Today we recommend the following: Orders Placed This Encounter  Procedures  . NM PET Image Initial (PI) Skull Base To Thigh   Return in about 4 weeks (around 11/12/2019). After completion of PET imaging.     Please do your part to reduce the spread of COVID-19.

## 2019-10-15 NOTE — Progress Notes (Signed)
Synopsis: Referred in October 2020 for pulmonary nodules by Carlena Hurl, PA-C  Subjective:   PATIENT ID: [name] GENDER: female DOB: 07/12/1933, MRN: [id number]  Chief Complaint  Patient presents with  . Consult    Consult for abnormal CT. Echo 6.29. CT 9.18. She reports having some irritation in her upper left chest. Denies SOB.    85 year old female past medical history of uterine cancer in 2004, gastroesophageal reflux disease, SVT, hyperlipidemia, allergies.  Patient doing well today.  She did complain of left-sided chest pain and she still has this.  She is able to point directly to it it is along the medial axillary line.  It is almost direct correlation to the location on CT imaging where the rounded opacity is seen.  I reviewed the patient's CT imaging today in the office with her as well as chest x-ray imaging that was present since July.  Patient is a lifelong non-smoker.  She has been married for greater than 60 years.  She lives with home with her husband.  She has 1 son and 2 grandchildren.  Patient denies fevers chills weight loss nausea vomiting or hemoptysis.   Past Medical History:  Diagnosis Date  . Allergy   . Anxiety   . Atrophic vaginitis   . Cataract    hx/o surgery, both eyes; Dr. Gershon Crane  . Diverticulosis    per 2010 colonoscopy  . Dyslipidemia   . Full dentures   . GERD (gastroesophageal reflux disease)    hx/o, resolved  . Glaucoma 2016  . H/O echocardiogram 06/22/2010   normal LVEF, moderate mitral and tricuspid regurg 07/2016 [name];  mild aortic valve stenosis, left ventricular normal, EF 55%; Dr. Gwenlyn Found  06/2010  . History of cardiovascular stress test 06/22/2010   normal bruce myocadial perfusion study, 72% EF; Dr. Gwenlyn Found  . History of mammogram    benign bilat calcifications, heterogeneously dense breasts, stable mammograms 2009-2014  . History of uterine cancer 12/2002   s/p TAH and BSO, pelvic and periarotic lymphadenectomy 12/2002,  no adjuvant therapy; stage 1b carcinosarcoma of uterus, completed 5 years of f/u as of 2008; Dr. Marti Sleigh gyencology oncology [place]; Dr. Lorriane Shire gynecology  . Hypertension   . Lactose intolerance   . 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 Rollene Fare  . Osteoporosis   . SVT (supraventricular tachycardia) ([place])    asymptomatic 2014, hx/o SVT, Dr. Rollene Fare  . Vitamin D deficiency      Family History  Family history unknown: Yes     Past Surgical History:  Procedure Laterality Date  . ABDOMINAL HYSTERECTOMY Bilateral    h/o uterine cancer; oophrectomy  . CATARACT EXTRACTION     bilat  . COLONOSCOPY  08/2016   08/2016  Dr. Benson Norway advised no repeat colonoscopy;  scattered diverticla, medium hemorrhoids 2010; Dr. Benson Norway  . NM MYOCAR PERF WALL MOTION    . TOTAL HIP ARTHROPLASTY Bilateral 2008, 2010    twice left (complication on the left, once on the right; Dr. Alphonzo Severance  . TRANSTHORACIC ECHOCARDIOGRAM  02/2013   ordered for SVT; EF 55-65%; calcified MV annulus, mod MR; RSVP increased; RA mildly dilated; mod TR; PA peak pressure 39mmHg    Social History   Socioeconomic History  . Marital status: Married    Spouse name: Not on file  . Number of children: 1  . Years of education: Not on file  . Highest  education level: Not on file  Occupational History  . Not on file  Social Needs  . Financial resource strain: Not on file  . Food insecurity    Worry: Not on file    Inability: Not on file  . Transportation needs    Medical: Not on file    Non-medical: Not on file  Tobacco Use  . Smoking status: Never Smoker  . Smokeless tobacco: Never Used  Substance and Sexual Activity  . Alcohol use: No  . Drug use: No  . Sexual activity: Never    Comment: walks 3x/wk, retired, married, 2 grandchildren  Lifestyle  . Physical activity    Days per week: Not on file    Minutes per session: Not on file  .  Stress: Not on file  Relationships  . Social Herbalist on phone: Not on file    Gets together: Not on file    Attends religious service: Not on file    Active member of club or organization: Not on file    Attends meetings of clubs or organizations: Not on file    Relationship status: Not on file  . Intimate partner violence    Fear of current or ex partner: Not on file    Emotionally abused: Not on file    Physically abused: Not on file    Forced sexual activity: Not on file  Other Topics Concern  . Not on file  Social History Narrative   Married, 1 son, 2 grandchildren; exercise 4 days per week - walking, mall and [employer].  Husband has hx/o CVA, but is ambulatory.  06/2017     No Known Allergies   Outpatient Medications Prior to Visit  Medication Sig Dispense Refill  . alendronate (FOSAMAX) 70 MG tablet TAKE 1 TABLET BY MOUTH EVERY 7 DAYS WITH A FULL GLASS OF WATER AN EMPTY STOMACH 12 tablet 3  . amoxicillin (AMOXIL) 500 MG tablet TAKE 4 TABS 1HR PRIOR TO DENTAL PROCEDURE 10 tablet 0  . atenolol (TENORMIN) 50 MG tablet Take 1 tablet (50 mg total) by mouth daily. 90 tablet 3  . calcium carbonate (CALCIUM 600) 600 MG TABS tablet Take 1 tablet (600 mg total) by mouth 2 (two) times daily with a meal. 180 tablet 3  . cetirizine (ZYRTEC) 10 MG tablet Take 10 mg by mouth daily as needed for allergies.    . Cholecalciferol (D3 HIGH POTENCY) 25 MCG (1000 UT) capsule Take 1 capsule (1,000 Units total) by mouth daily. 90 capsule 3  . latanoprost (XALATAN) 0.005 % ophthalmic solution 1 drop at bedtime.    Marland Kitchen lisinopril (ZESTRIL) 10 MG tablet Take 1 tablet (10 mg total) by mouth daily. 90 tablet 3  . simvastatin (ZOCOR) 40 MG tablet TAKE 1 TABLET BY MOUTH EVERYDAY AT BEDTIME 90 tablet 3   No facility-administered medications prior to visit.     Review of Systems  Constitutional: Negative for chills, fever, malaise/fatigue and weight loss.  HENT: Negative for hearing loss, sore  throat and tinnitus.   Eyes: Negative for blurred vision and double vision.  Respiratory: Negative for cough, hemoptysis, sputum production, shortness of breath, wheezing and stridor.   Cardiovascular: Positive for chest pain. Negative for palpitations, orthopnea, leg swelling and PND.       Left lateral chest wall pain.  Gastrointestinal: Negative for abdominal pain, constipation, diarrhea, heartburn, nausea and vomiting.  Genitourinary: Negative for dysuria, hematuria and urgency.  Musculoskeletal: Negative for joint pain and myalgias.  Skin: Negative for itching and rash.  Neurological: Negative for dizziness, tingling, weakness and headaches.  Endo/Heme/Allergies: Negative for environmental allergies. Does not bruise/bleed easily.  Psychiatric/Behavioral: Negative for depression. The patient is not nervous/anxious and does not have insomnia.   All other systems reviewed and are negative.    Objective:  Physical Exam Vitals signs reviewed.  Constitutional:      General: She is not in acute distress.    Appearance: She is well-developed.  HENT:     Head: Normocephalic and atraumatic.  Eyes:     General: No scleral icterus.    Conjunctiva/sclera: Conjunctivae normal.     Pupils: Pupils are equal, round, and reactive to light.  Neck:     Musculoskeletal: Neck supple.     Vascular: No JVD.     Trachea: No tracheal deviation.  Cardiovascular:     Rate and Rhythm: Normal rate and regular rhythm.     Heart sounds: Normal heart sounds. No murmur.  Pulmonary:     Effort: Pulmonary effort is normal. No tachypnea, accessory muscle usage or respiratory distress.     Breath sounds: Normal breath sounds. No stridor. No wheezing, rhonchi or rales.  Abdominal:     General: Bowel sounds are normal. There is no distension.     Palpations: Abdomen is soft.     Tenderness: There is no abdominal tenderness.  Musculoskeletal:        General: No tenderness.  Lymphadenopathy:     Cervical: No  cervical adenopathy.  Skin:    General: Skin is warm and dry.     Capillary Refill: Capillary refill takes less than 2 seconds.     Findings: No rash.  Neurological:     Mental Status: She is alert and oriented to person, place, and time.  Psychiatric:        Behavior: Behavior normal.      Vitals:   10/15/19 0925  BP: 134/80  Pulse: (!) 58  Temp: (!) 96.9 F (36.1 C)  TempSrc: Temporal  SpO2: 100%  Weight: 153 lb 9.6 oz (69.7 kg)  Height: 5\' 1"  (1.549 m)   100% on RA BMI Readings from Last 3 Encounters:  10/15/19 29.02 kg/m  08/31/19 28.09 kg/m  08/12/19 28.02 kg/m   Wt Readings from Last 3 Encounters:  10/15/19 153 lb 9.6 oz (69.7 kg)  08/31/19 153 lb 9.6 oz (69.7 kg)  08/12/19 153 lb 3.2 oz (69.5 kg)     CBC    Component Value Date/Time   WBC 4.3 08/12/2019 0947   WBC 4.2 02/09/2019 0843   RBC 4.70 08/12/2019 0947   RBC 4.87 02/09/2019 0843   HGB 13.5 08/12/2019 0947   HCT 42.4 08/12/2019 0947   PLT 184 08/12/2019 0947   MCV 90 08/12/2019 0947   MCH 28.7 08/12/2019 0947   MCH 28.7 02/09/2019 0843   MCHC 31.8 08/12/2019 0947   MCHC 31.7 02/09/2019 0843   RDW 13.6 08/12/2019 0947   LYMPHSABS 1.6 08/12/2019 0947   MONOABS 0.6 02/09/2019 0843   EOSABS 0.1 08/12/2019 0947   BASOSABS 0.0 08/12/2019 0947    Chest Imaging: CT chest 09/11/2019: Pleural-based opacity within the left lower lobe 3 cm in size.  Radiology states potentially consistent with rounded atelectasis and/or scarring.  Also has 6 mm nodule within the right upper lobe of the lung. The patient's images have been independently reviewed by me.    Pulmonary Functions Testing Results: No flowsheet data found.  FeNO: None  Pathology: None   Echocardiogram:   ECHO 06/2019  1. The left ventricle has hyperdynamic systolic function, with an ejection fraction of >65%. The cavity size was normal. There is mild concentric left ventricular hypertrophy. Left ventricular diastolic Doppler  parameters are consistent with  pseudonormalization.  2. The right ventricle has normal systolic function. The cavity was normal. There is no increase in right ventricular wall thickness. Right ventricular systolic pressure is mildly elevated with an estimated pressure of 40.1 mmHg.  07/21/2019 Myocardial perfusion imaging  The left ventricular ejection fraction is mildly decreased (45-54%).  Nuclear stress EF: 52%.  There was no ST segment deviation noted during stress.  Findings consistent with ischemia.  This is a low risk study.  Heart Catheterization: None     Assessment & Plan:     ICD-10-CM   1. Nodule of lower lobe of left lung  R91.1   2. Nodule of upper lobe of right lung  R91.1   3. Solitary pulmonary nodule  R91.1 NM PET Image Initial (PI) Skull Base To Thigh  4. Chest wall pain  R07.89     Discussion: This is an 85 year old female with a left lower lobe subpleural lesion.  She also complains of chest wall pain right at the site where this lesion is present on imaging.  Due to the pain and the persistence of what appears to be rounded atelectasis we cannot entirely exclude an underlying parenchymal lesion or pulmonary nodule.  That could lead to the rounded atelectasis persistence.  I think the next best step for evaluation would be nuclear medicine pet imaging.  Plan: We will obtain nuclear medicine pet imaging for further evaluation of the lower lobe lesion. We discussed today the risk benefits and alternatives of proceeding with additional imaging. Patient is agreeable to proceed. Patient to return to our clinic in 4 weeks after imaging is complete or sooner.  Greater than 50% of this patient's 45-minute office was spent face-to-face discussing the above recommendations and treatment plan.  As well as review of patient's chest x-ray and CT imaging that was completed in September.   Current Outpatient Medications:  .  alendronate (FOSAMAX) 70 MG tablet, TAKE 1  TABLET BY MOUTH EVERY 7 DAYS WITH A FULL GLASS OF WATER AN EMPTY STOMACH, Disp: 12 tablet, Rfl: 3 .  amoxicillin (AMOXIL) 500 MG tablet, TAKE 4 TABS 1HR PRIOR TO DENTAL PROCEDURE, Disp: 10 tablet, Rfl: 0 .  atenolol (TENORMIN) 50 MG tablet, Take 1 tablet (50 mg total) by mouth daily., Disp: 90 tablet, Rfl: 3 .  calcium carbonate (CALCIUM 600) 600 MG TABS tablet, Take 1 tablet (600 mg total) by mouth 2 (two) times daily with a meal., Disp: 180 tablet, Rfl: 3 .  cetirizine (ZYRTEC) 10 MG tablet, Take 10 mg by mouth daily as needed for allergies., Disp: , Rfl:  .  Cholecalciferol (D3 HIGH POTENCY) 25 MCG (1000 UT) capsule, Take 1 capsule (1,000 Units total) by mouth daily., Disp: 90 capsule, Rfl: 3 .  latanoprost (XALATAN) 0.005 % ophthalmic solution, 1 drop at bedtime., Disp: , Rfl:  .  lisinopril (ZESTRIL) 10 MG tablet, Take 1 tablet (10 mg total) by mouth daily., Disp: 90 tablet, Rfl: 3 .  simvastatin (ZOCOR) 40 MG tablet, TAKE 1 TABLET BY MOUTH EVERYDAY AT BEDTIME, Disp: 90 tablet, Rfl: 3   Garner Nash, DO [facility] 10/15/2019 9:37 AM

## 2019-10-23 ENCOUNTER — Other Ambulatory Visit: Payer: Self-pay

## 2019-10-23 ENCOUNTER — Ambulatory Visit (HOSPITAL_COMMUNITY)
Admission: RE | Admit: 2019-10-23 | Discharge: 2019-10-23 | Disposition: A | Discharge status: Home / Self Care | Payer: Medicare Other | Source: Ambulatory Visit | Attending: Pulmonary Disease | Admitting: Pulmonary Disease

## 2019-10-23 DIAGNOSIS — I2510 Atherosclerotic heart disease of native coronary artery without angina pectoris: Secondary | ICD-10-CM | POA: Diagnosis not present

## 2019-10-23 DIAGNOSIS — R911 Solitary pulmonary nodule: Secondary | ICD-10-CM | POA: Diagnosis not present

## 2019-10-23 DIAGNOSIS — I700 Atherosclerosis of aorta: Secondary | ICD-10-CM | POA: Insufficient documentation

## 2019-10-23 DIAGNOSIS — J018 Other acute sinusitis: Secondary | ICD-10-CM | POA: Diagnosis not present

## 2019-10-23 LAB — GLUCOSE, CAPILLARY: Glucose-Capillary: 85 mg/dL (ref 70–99)

## 2019-10-23 MED ORDER — FLUDEOXYGLUCOSE F - 18 (FDG) INJECTION
8.6100 | Freq: Once | INTRAVENOUS | Status: AC
  Administered 2019-10-23: 8.61 via INTRAVENOUS

## 2019-10-28 ENCOUNTER — Telehealth: Payer: Self-pay | Admitting: Pulmonary Disease

## 2019-10-28 DIAGNOSIS — R9402 Abnormal brain scan: Secondary | ICD-10-CM

## 2019-10-28 NOTE — Telephone Encounter (Signed)
PCCM:  I called and spoke with the patient regarding her recent PET scan imaging.  The lung nodule under investigation reveals a small subpleural density with bandlike proportion and nodular proportion measuring only 1.9 x 1.2 cm consistent with rounded atelectasis and SUV max of 1.9.  We can continue to follow this clinically but no additional imaging at this time.  Of note the PET scan did reveal uptake within the right palatine tonsil.  SUV max of 10.  We will place a referral to ear nose and throat for evaluation.  CC: Tysinger, Camelia Eng, PA-C   Thanks  Garner Nash, DO [facility] 10/28/2019 5:48 PM

## 2019-11-03 DIAGNOSIS — J358 Other chronic diseases of tonsils and adenoids: Secondary | ICD-10-CM | POA: Insufficient documentation

## 2019-11-18 ENCOUNTER — Other Ambulatory Visit: Payer: Self-pay

## 2019-11-18 ENCOUNTER — Ambulatory Visit (INDEPENDENT_AMBULATORY_CARE_PROVIDER_SITE_OTHER): Payer: Medicare Other | Admitting: Pulmonary Disease

## 2019-11-18 ENCOUNTER — Encounter: Payer: Self-pay | Admitting: Pulmonary Disease

## 2019-11-18 DIAGNOSIS — R918 Other nonspecific abnormal finding of lung field: Secondary | ICD-10-CM

## 2019-11-18 DIAGNOSIS — R9402 Abnormal brain scan: Secondary | ICD-10-CM

## 2019-11-18 DIAGNOSIS — R911 Solitary pulmonary nodule: Secondary | ICD-10-CM | POA: Diagnosis not present

## 2019-11-18 NOTE — Patient Instructions (Signed)
Thank you for visiting Dr. Valeta Harms at [facility]. Today we recommend the following:  Orders Placed This Encounter  Procedures  . CT CHEST WO CONTRAST   Return in about 6 months (around 05/18/2020).    Please do your part to reduce the spread of COVID-19. '

## 2019-11-18 NOTE — Progress Notes (Signed)
Virtual Visit via Telephone Note  I connected with [name] on 11/18/19 at  9:15 AM EST by telephone and verified that I am speaking with the correct person using two identifiers.  Location: Patient: [name]  Provider: Garner Nash, DO    I discussed the limitations, risks, security and privacy concerns of performing an evaluation and management service by telephone and the availability of in person appointments. I also discussed with the patient that there may be a patient responsible charge related to this service. The patient expressed understanding and agreed to proceed.  History of Present Illness:  OV 10/15/2019: "85 year old female past medical history of uterine cancer in 2004, gastroesophageal reflux disease, SVT, hyperlipidemia, allergies.  Patient doing well today.  She did complain of left-sided chest pain and she still has this.  She is able to point directly to it it is along the medial axillary line.  It is almost direct correlation to the location on CT imaging where the rounded opacity is seen.  I reviewed the patient's CT imaging today in the office with her as well as chest x-ray imaging that was present since July.  Patient is a lifelong non-smoker.  She has been married for greater than 60 years.  She lives with home with her husband.  She has 1 son and 2 grandchildren.  Patient denies fevers chills weight loss nausea vomiting or hemoptysis."   Telephone visit: 11/18/2019: Today on the phone we discussed her recent PET scan imaging as well as her evaluation by ear nose and throat.  She has a high SUV uptake within the right tonsil and asymmetry.  Dr. Redmond Baseman saw and evaluated the patient in the office and has recommended a right-sided tonsillectomy.  This is to be scheduled in January 2021.  Today in the office we also discussed the findings of the lower lobe lung nodule/rounded opacity as well as a 6 mm nodule within the right upper lobe apex.  There was low  SUV uptake in the lower lobe rounded opacity consistent with rounded atelectasis.  We discussed this and how this is likely a benign finding.  She was thankful for the news.  She is anxious about potentially needing surgery in January.  Observations/Objective: Breathing ok. Nonlabored. Able to speak in full sentences.   10/23/2019: Nuclear medicine imaging PET avid uptake within the right tonsil, lower lobe opacity low-level uptake No uptake within the small 6 mm nodule. The patient's images have been independently reviewed by me.    Assessment and Plan: Left lower lobe 3 cm opacity, likely rounded atelectasis, low PET uptake 6 mm right upper lobe pulmonary nodule Asymmetric, PET avid right tonsil, planned tonsillectomy by Dr. Redmond Baseman  Plan: We will plan for a repeat noncontrasted CT of the chest in 6 months.  Follow Up Instructions:  Return to clinic via telephone visit to discuss CT scan results with myself or app in 6 months.   I discussed the assessment and treatment plan with the patient. The patient was provided an opportunity to ask questions and all were answered. The patient agreed with the plan and demonstrated an understanding of the instructions.   The patient was advised to call back or seek an in-person evaluation if the symptoms worsen or if the condition fails to improve as anticipated.  I provided 17 minutes of non-face-to-face time during this encounter.   Garner Nash, DO

## 2019-12-15 ENCOUNTER — Other Ambulatory Visit: Payer: Self-pay | Admitting: Otolaryngology

## 2019-12-22 ENCOUNTER — Other Ambulatory Visit: Payer: Self-pay

## 2019-12-22 ENCOUNTER — Encounter (HOSPITAL_BASED_OUTPATIENT_CLINIC_OR_DEPARTMENT_OTHER): Payer: Self-pay | Admitting: Otolaryngology

## 2019-12-24 ENCOUNTER — Other Ambulatory Visit (HOSPITAL_COMMUNITY)
Admission: RE | Admit: 2019-12-24 | Discharge: 2019-12-24 | Disposition: A | Discharge status: Home / Self Care | Payer: Medicare PPO | Source: Ambulatory Visit | Attending: Otolaryngology | Admitting: Otolaryngology

## 2019-12-24 DIAGNOSIS — Z20822 Contact with and (suspected) exposure to covid-19: Secondary | ICD-10-CM | POA: Insufficient documentation

## 2019-12-24 DIAGNOSIS — Z01812 Encounter for preprocedural laboratory examination: Secondary | ICD-10-CM | POA: Insufficient documentation

## 2019-12-25 ENCOUNTER — Other Ambulatory Visit: Payer: Self-pay | Admitting: Otolaryngology

## 2019-12-26 LAB — NOVEL CORONAVIRUS, NAA (HOSP ORDER, SEND-OUT TO REF LAB; TAT 18-24 HRS): SARS-CoV-2, NAA: NOT DETECTED

## 2019-12-28 ENCOUNTER — Other Ambulatory Visit: Payer: Self-pay

## 2019-12-28 ENCOUNTER — Ambulatory Visit (HOSPITAL_BASED_OUTPATIENT_CLINIC_OR_DEPARTMENT_OTHER): Payer: Medicare PPO | Admitting: Certified Registered Nurse Anesthetist

## 2019-12-28 ENCOUNTER — Encounter (HOSPITAL_BASED_OUTPATIENT_CLINIC_OR_DEPARTMENT_OTHER)
Admission: RE | Disposition: A | Discharge status: Home / Self Care | Payer: Self-pay | Source: Home / Self Care | Attending: Otolaryngology

## 2019-12-28 ENCOUNTER — Encounter (HOSPITAL_BASED_OUTPATIENT_CLINIC_OR_DEPARTMENT_OTHER): Payer: Self-pay | Admitting: Otolaryngology

## 2019-12-28 ENCOUNTER — Ambulatory Visit (HOSPITAL_BASED_OUTPATIENT_CLINIC_OR_DEPARTMENT_OTHER)
Admission: RE | Admit: 2019-12-28 | Discharge: 2019-12-28 | Disposition: A | Discharge status: Home / Self Care | Payer: Medicare PPO | Attending: Otolaryngology | Admitting: Otolaryngology

## 2019-12-28 DIAGNOSIS — J351 Hypertrophy of tonsils: Secondary | ICD-10-CM | POA: Diagnosis present

## 2019-12-28 DIAGNOSIS — M1990 Unspecified osteoarthritis, unspecified site: Secondary | ICD-10-CM | POA: Insufficient documentation

## 2019-12-28 DIAGNOSIS — F419 Anxiety disorder, unspecified: Secondary | ICD-10-CM | POA: Diagnosis not present

## 2019-12-28 DIAGNOSIS — Z79899 Other long term (current) drug therapy: Secondary | ICD-10-CM | POA: Insufficient documentation

## 2019-12-28 DIAGNOSIS — Z8542 Personal history of malignant neoplasm of other parts of uterus: Secondary | ICD-10-CM | POA: Insufficient documentation

## 2019-12-28 DIAGNOSIS — K219 Gastro-esophageal reflux disease without esophagitis: Secondary | ICD-10-CM | POA: Insufficient documentation

## 2019-12-28 DIAGNOSIS — Z7982 Long term (current) use of aspirin: Secondary | ICD-10-CM | POA: Diagnosis not present

## 2019-12-28 DIAGNOSIS — E785 Hyperlipidemia, unspecified: Secondary | ICD-10-CM | POA: Insufficient documentation

## 2019-12-28 DIAGNOSIS — Z96643 Presence of artificial hip joint, bilateral: Secondary | ICD-10-CM | POA: Insufficient documentation

## 2019-12-28 DIAGNOSIS — I10 Essential (primary) hypertension: Secondary | ICD-10-CM | POA: Diagnosis not present

## 2019-12-28 DIAGNOSIS — C099 Malignant neoplasm of tonsil, unspecified: Secondary | ICD-10-CM | POA: Insufficient documentation

## 2019-12-28 HISTORY — PX: TONSILLECTOMY: SHX5217

## 2019-12-28 HISTORY — DX: Unspecified osteoarthritis, unspecified site: M19.90

## 2019-12-28 HISTORY — DX: Other complications of anesthesia, initial encounter: T88.59XA

## 2019-12-28 HISTORY — DX: Allergy, unspecified, initial encounter: T78.40XA

## 2019-12-28 HISTORY — DX: Nausea with vomiting, unspecified: R11.2

## 2019-12-28 HISTORY — DX: Other specified postprocedural states: Z98.890

## 2019-12-28 SURGERY — TONSILLECTOMY
Anesthesia: General | Site: Throat | Laterality: Right

## 2019-12-28 MED ORDER — DEXAMETHASONE SODIUM PHOSPHATE 10 MG/ML IJ SOLN
INTRAMUSCULAR | Status: DC | PRN
  Administered 2019-12-28: 5 mg via INTRAVENOUS

## 2019-12-28 MED ORDER — PROPOFOL 500 MG/50ML IV EMUL
INTRAVENOUS | Status: DC | PRN
  Administered 2019-12-28: 150 ug/kg/min via INTRAVENOUS
  Administered 2019-12-28 (×2): 20 mg via INTRAVENOUS
  Administered 2019-12-28: 100 mg via INTRAVENOUS

## 2019-12-28 MED ORDER — LIDOCAINE 2% (20 MG/ML) 5 ML SYRINGE
INTRAMUSCULAR | Status: DC | PRN
  Administered 2019-12-28: 50 mg via INTRAVENOUS

## 2019-12-28 MED ORDER — FENTANYL CITRATE (PF) 100 MCG/2ML IJ SOLN
50.0000 ug | INTRAMUSCULAR | Status: DC | PRN
  Administered 2019-12-28: 50 ug via INTRAVENOUS

## 2019-12-28 MED ORDER — LACTATED RINGERS IV SOLN: INTRAVENOUS | Status: DC

## 2019-12-28 MED ORDER — ONDANSETRON HCL 4 MG/2ML IJ SOLN
INTRAMUSCULAR | Status: DC | PRN
  Administered 2019-12-28: 4 mg via INTRAVENOUS

## 2019-12-28 MED ORDER — FENTANYL CITRATE (PF) 100 MCG/2ML IJ SOLN
INTRAMUSCULAR | Status: AC
  Filled 2019-12-28: qty 2

## 2019-12-28 MED ORDER — LIDOCAINE 2% (20 MG/ML) 5 ML SYRINGE
INTRAMUSCULAR | Status: AC
  Filled 2019-12-28: qty 10

## 2019-12-28 MED ORDER — DEXMEDETOMIDINE HCL IN NACL 200 MCG/50ML IV SOLN
INTRAVENOUS | Status: AC
  Filled 2019-12-28: qty 50

## 2019-12-28 MED ORDER — BACITRACIN ZINC 500 UNIT/GM EX OINT
TOPICAL_OINTMENT | CUTANEOUS | Status: AC
  Filled 2019-12-28: qty 0.9

## 2019-12-28 MED ORDER — DEXAMETHASONE SODIUM PHOSPHATE 10 MG/ML IJ SOLN
INTRAMUSCULAR | Status: AC
  Filled 2019-12-28: qty 1

## 2019-12-28 MED ORDER — ONDANSETRON HCL 4 MG/2ML IJ SOLN: 4.0000 mg | Freq: Once | INTRAMUSCULAR | Status: DC | PRN

## 2019-12-28 MED ORDER — OXYMETAZOLINE HCL 0.05 % NA SOLN
NASAL | Status: AC
  Filled 2019-12-28: qty 30

## 2019-12-28 MED ORDER — PROPOFOL 500 MG/50ML IV EMUL
INTRAVENOUS | Status: AC
  Filled 2019-12-28: qty 100

## 2019-12-28 MED ORDER — LIDOCAINE 2% (20 MG/ML) 5 ML SYRINGE
INTRAMUSCULAR | Status: AC
  Filled 2019-12-28: qty 5

## 2019-12-28 MED ORDER — ONDANSETRON HCL 4 MG/2ML IJ SOLN
INTRAMUSCULAR | Status: AC
  Filled 2019-12-28: qty 6

## 2019-12-28 MED ORDER — DEXMEDETOMIDINE HCL 200 MCG/2ML IV SOLN
INTRAVENOUS | Status: DC | PRN
  Administered 2019-12-28 (×2): 4 ug via INTRAVENOUS

## 2019-12-28 MED ORDER — ROCURONIUM BROMIDE 100 MG/10ML IV SOLN
INTRAVENOUS | Status: DC | PRN
  Administered 2019-12-28: 30 mg via INTRAVENOUS

## 2019-12-28 MED ORDER — SUGAMMADEX SODIUM 200 MG/2ML IV SOLN
INTRAVENOUS | Status: DC | PRN
  Administered 2019-12-28: 150 mg via INTRAVENOUS

## 2019-12-28 MED ORDER — PROPOFOL 10 MG/ML IV BOLUS
INTRAVENOUS | Status: AC
  Filled 2019-12-28: qty 20

## 2019-12-28 MED ORDER — HYDROCODONE-ACETAMINOPHEN 7.5-325 MG/15ML PO SOLN: 10.0000 mL | Freq: Four times a day (QID) | ORAL | 0 refills | Status: AC | PRN

## 2019-12-28 MED ORDER — ACETAMINOPHEN 10 MG/ML IV SOLN: 1000.0000 mg | Freq: Once | INTRAVENOUS | Status: DC | PRN

## 2019-12-28 MED ORDER — LACTATED RINGERS IV SOLN: INTRAVENOUS | Status: DC | PRN

## 2019-12-28 MED ORDER — MIDAZOLAM HCL 2 MG/2ML IJ SOLN: 1.0000 mg | INTRAMUSCULAR | Status: DC | PRN

## 2019-12-28 MED ORDER — FENTANYL CITRATE (PF) 100 MCG/2ML IJ SOLN: 25.0000 ug | INTRAMUSCULAR | Status: DC | PRN

## 2019-12-28 SURGICAL SUPPLY — 33 items
CANISTER SUCT 1200ML W/VALVE (MISCELLANEOUS) ×3 IMPLANT
CATH ROBINSON RED A/P 10FR (CATHETERS) IMPLANT
CATH ROBINSON RED A/P 14FR (CATHETERS) IMPLANT
CLEANER CAUTERY TIP 5X5 PAD (MISCELLANEOUS) IMPLANT
COAGULATOR SUCT 6 FR SWTCH (ELECTROSURGICAL) ×1
COAGULATOR SUCT SWTCH 10FR 6 (ELECTROSURGICAL) ×2 IMPLANT
COVER BACK TABLE 60X90IN (DRAPES) ×3 IMPLANT
COVER MAYO STAND STRL (DRAPES) ×3 IMPLANT
COVER WAND RF STERILE (DRAPES) IMPLANT
DRAPE HALF SHEET 70X43 (DRAPES) ×3 IMPLANT
ELECT COATED BLADE 2.86 ST (ELECTRODE) ×3 IMPLANT
ELECT REM PT RETURN 9FT ADLT (ELECTROSURGICAL) ×3
ELECT REM PT RETURN 9FT PED (ELECTROSURGICAL)
ELECTRODE REM PT RETRN 9FT PED (ELECTROSURGICAL) IMPLANT
ELECTRODE REM PT RTRN 9FT ADLT (ELECTROSURGICAL) IMPLANT
GAUZE SPONGE 4X4 12PLY STRL LF (GAUZE/BANDAGES/DRESSINGS) ×3 IMPLANT
GLOVE BIO SURGEON STRL SZ7.5 (GLOVE) ×3 IMPLANT
GLOVE BIOGEL PI IND STRL 7.0 (GLOVE) IMPLANT
GLOVE BIOGEL PI INDICATOR 7.0 (GLOVE) ×2
GLOVE ECLIPSE 6.5 STRL STRAW (GLOVE) ×2 IMPLANT
GOWN STRL REUS W/ TWL LRG LVL3 (GOWN DISPOSABLE) ×2 IMPLANT
GOWN STRL REUS W/TWL LRG LVL3 (GOWN DISPOSABLE) ×6
NS IRRIG 1000ML POUR BTL (IV SOLUTION) ×3 IMPLANT
PAD CLEANER CAUTERY TIP 5X5 (MISCELLANEOUS)
PENCIL FOOT CONTROL (ELECTRODE) ×3 IMPLANT
SOLUTION BUTLER CLEAR DIP (MISCELLANEOUS) ×3 IMPLANT
SPONGE TONSIL TAPE 1 RFD (DISPOSABLE) IMPLANT
SPONGE TONSIL TAPE 1.25 RFD (DISPOSABLE) IMPLANT
SYR BULB 3OZ (MISCELLANEOUS) ×3 IMPLANT
TOWEL GREEN STERILE FF (TOWEL DISPOSABLE) ×3 IMPLANT
TUBE CONNECTING 20'X1/4 (TUBING) ×1
TUBE CONNECTING 20X1/4 (TUBING) ×2 IMPLANT
TUBE SALEM SUMP 16 FR W/ARV (TUBING) ×3 IMPLANT

## 2019-12-28 NOTE — H&P (Signed)
[name] is an 86 y.o. female.   Chief Complaint: Tonsil asymmetry HPI: 86 year old female who had been noticing pain in the left lower chest that led to a CT scan and PET scan that demonstrated increased activity in the right tonsil.  She has had no symptoms.  She presents for right tonsillectomy.  Past Medical History:  Diagnosis Date  . Allergies   . Allergy   . Anxiety   . Arthritis   . Atrophic vaginitis   . Cataract    hx/o surgery, both eyes; Dr. Gershon Crane  . Complication of anesthesia   . Diverticulosis    per 2010 colonoscopy  . Dyslipidemia   . Full dentures   . GERD (gastroesophageal reflux disease)    hx/o, resolved  . Glaucoma 2016  . H/O echocardiogram 06/22/2010   normal LVEF, moderate mitral and tricuspid regurg 07/2016 [name];  mild aortic valve stenosis, left ventricular normal, EF 55%; Dr. Gwenlyn Found  06/2010  . History of cardiovascular stress test 06/22/2010   normal bruce myocadial perfusion study, 72% EF; Dr. Gwenlyn Found  . History of mammogram    benign bilat calcifications, heterogeneously dense breasts, stable mammograms 2009-2014  . 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 Marti Sleigh gyencology oncology [place]; Dr. Lorriane Shire gynecology  . Hypertension   . Lactose intolerance   . 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 Rollene Fare  . Osteoporosis   . PONV (postoperative nausea and vomiting)   . SVT (supraventricular tachycardia) ([place])    asymptomatic 2014, hx/o SVT, Dr. Rollene Fare  . Vitamin D deficiency     Past Surgical History:  Procedure Laterality Date  . ABDOMINAL HYSTERECTOMY Bilateral    h/o uterine cancer; oophrectomy  . CATARACT EXTRACTION     bilat  . COLONOSCOPY  08/2016   08/2016  Dr. Benson Norway advised no repeat  colonoscopy;  scattered diverticla, medium hemorrhoids 2010; Dr. Benson Norway  . JOINT REPLACEMENT    . NM MYOCAR PERF WALL MOTION    . TOTAL HIP ARTHROPLASTY Bilateral 2008, 2010    twice left (complication on the left, once on the right; Dr. Alphonzo Severance  . TRANSTHORACIC ECHOCARDIOGRAM  02/2013   ordered for SVT; EF 55-65%; calcified MV annulus, mod MR; RSVP increased; RA mildly dilated; mod TR; PA peak pressure 74mmHg    Family History  Family history unknown: Yes   Social History:  reports that she has never smoked. She has never used smokeless tobacco. She reports that she does not drink alcohol or use drugs.  Allergies:  Allergies  Allergen Reactions  . Other Nausea Only    "medicines with steroids in it make me nauseous"    Medications Prior to Admission  Medication Sig Dispense Refill  . aspirin EC 81 MG tablet Take 81 mg by mouth daily.    Marland Kitchen atenolol (TENORMIN) 50 MG tablet Take 1 tablet (50 mg total) by mouth daily. 90 tablet 3  . calcium carbonate (CALCIUM 600) 600 MG TABS tablet Take 1 tablet (600 mg total) by mouth 2 (two) times daily with a meal. 180 tablet 3  . cetirizine (ZYRTEC) 10 MG tablet Take 10 mg by mouth daily as needed for allergies.    . Cholecalciferol (D3 HIGH POTENCY) 25 MCG (1000 UT) capsule Take  1 capsule (1,000 Units total) by mouth daily. 90 capsule 3  . latanoprost (XALATAN) 0.005 % ophthalmic solution 1 drop at bedtime.    Marland Kitchen lisinopril (ZESTRIL) 10 MG tablet Take 1 tablet (10 mg total) by mouth daily. 90 tablet 3  . simvastatin (ZOCOR) 40 MG tablet TAKE 1 TABLET BY MOUTH EVERYDAY AT BEDTIME 90 tablet 3  . alendronate (FOSAMAX) 70 MG tablet TAKE 1 TABLET BY MOUTH EVERY 7 DAYS WITH A FULL GLASS OF WATER AN EMPTY STOMACH 12 tablet 3    No results found for this or any previous visit (from the past 80 hour(s)). No results found.  Review of Systems  All other systems reviewed and are negative.   Blood pressure (!) 143/83, pulse 67, temperature (!) 96.4 F  (35.8 C), temperature source Tympanic, resp. rate 18, height 5\' 1"  (1.549 m), weight 69.7 kg, SpO2 100 %. Physical Exam  Constitutional: She is oriented to person, place, and time. She appears well-developed and well-nourished. No distress.  HENT:  Head: Normocephalic and atraumatic.  Right Ear: External ear normal.  Left Ear: External ear normal.  Nose: Nose normal.  Mouth/Throat: Oropharynx is clear and moist.  Tonsils 1+ and symmetric  Eyes: Pupils are equal, round, and reactive to light. Conjunctivae and EOM are normal.  Cardiovascular: Normal rate.  Respiratory: Effort normal.  Musculoskeletal:     Cervical back: Normal range of motion and neck supple.  Neurological: She is alert and oriented to person, place, and time. No cranial nerve deficit.  Skin: Skin is warm and dry.  Psychiatric: She has a normal mood and affect. Her behavior is normal. Judgment and thought content normal.     Assessment/Plan Right tonsil asymmetry  Due to the abnormal PET result, she presents for right tonsillectomy.  Melida Quitter, MD 12/28/2019, 7:33 AM

## 2019-12-28 NOTE — Anesthesia Preprocedure Evaluation (Addendum)
Anesthesia Evaluation  Patient identified by MRN, date of birth, ID band Patient awake    Reviewed: Allergy & Precautions, NPO status , Patient's Chart, lab work & pertinent test results  History of Anesthesia Complications (+) PONV  Airway Mallampati: I  TM Distance: >3 FB Neck ROM: Full    Dental no notable dental hx. (+) Edentulous Upper   Pulmonary neg pulmonary ROS,    Pulmonary exam normal breath sounds clear to auscultation       Cardiovascular hypertension, Normal cardiovascular exam+ dysrhythmias Supra Ventricular Tachycardia  Rhythm:Regular Rate:Normal     Neuro/Psych Anxiety negative neurological ROS     GI/Hepatic Neg liver ROS, GERD  ,  Endo/Other  negative endocrine ROS  Renal/GU      Musculoskeletal   Abdominal   Peds  Hematology   Anesthesia Other Findings Hx of uterine CA  Reproductive/Obstetrics                            Anesthesia Physical Anesthesia Plan  ASA: III  Anesthesia Plan: General   Post-op Pain Management:    Induction:   PONV Risk Score and Plan: 4 or greater and Ondansetron, Dexamethasone, TIVA and Treatment may vary due to age or medical condition  Airway Management Planned: Oral ETT  Additional Equipment: None  Intra-op Plan:   Post-operative Plan: Extubation in OR  Informed Consent: I have reviewed the patients History and Physical, chart, labs and discussed the procedure including the risks, benefits and alternatives for the proposed anesthesia with the patient or authorized representative who has indicated his/her understanding and acceptance.     Dental advisory given  Plan Discussed with:   Anesthesia Plan Comments: ([place])       Anesthesia Quick Evaluation

## 2019-12-28 NOTE — Anesthesia Postprocedure Evaluation (Signed)
Anesthesia Post Note  Patient: [name]  Procedure(s) Performed: TONSILLECTOMY (Right Throat)     Patient location during evaluation: PACU Anesthesia Type: General Level of consciousness: awake and alert Pain management: pain level controlled Vital Signs Assessment: post-procedure vital signs reviewed and stable Respiratory status: spontaneous breathing, nonlabored ventilation, respiratory function stable and patient connected to nasal cannula oxygen Cardiovascular status: blood pressure returned to baseline and stable Postop Assessment: no apparent nausea or vomiting Anesthetic complications: no    Last Vitals:  Vitals:   12/28/19 0925 12/28/19 0946  BP: (!) 159/80 (!) 165/87  Pulse: 70 65  Resp: 18 18  Temp: 37 C   SpO2: 100% 100%    Last Pain:  Vitals:   12/28/19 0946  TempSrc:   PainSc: 0-No pain                 Barnet Glasgow

## 2019-12-28 NOTE — Op Note (Signed)
NAME: [name], [name] MEDICAL RECORD [id number] ACCOUNT [id number] DATE OF BIRTH:04-05-1933 FACILITY: [facility] LOCATION: [facility] PHYSICIAN:Montrel Donahoe Guido Sander, MD  OPERATIVE REPORT  DATE OF PROCEDURE:  12/28/2019  PREOPERATIVE DIAGNOSIS:  Tonsillar asymmetry.  POSTOPERATIVE DIAGNOSIS:  Tonsillar asymmetry.  PROCEDURE:  Right tonsillectomy.  SURGEON:  Melida Quitter, MD  ANESTHESIA:  General endotracheal anesthesia.  COMPLICATIONS:  None.  INDICATIONS:  The patient is an 86 year old female who recently underwent a PET scan as a workup for chest pain that demonstrated asymmetric hypermetabolic activity in the right tonsil.  She has had no symptoms.  However, due to the asymmetry, she  presents to the operating room for right tonsillectomy.  FINDINGS:  Tonsils were 1+ and symmetric in size and texture.  The right tonsil was sent for pathology.  DESCRIPTION OF PROCEDURE:  The patient was identified in the holding room, informed consent having been obtained including discussion of risks, benefits and alternatives, the patient was brought to the operative suite and put on operating table in supine  position.  Anesthesia was induced and the patient was intubated by the anesthesia team without difficulty.  The patient was given intravenous steroids during the case.  The eyes were taped closed and the bed was turned 90 degrees from anesthesia.  Head  wrap was placed around the patient's head and a Crowe-Davis retractor was inserted in the mouth and opened to reveal the oropharynx.  This was placed in suspension on the Mayo stand.  The tonsils were observed and palpated.  The right tonsil was grasped  with a curved Allis and retracted medially while a curvilinear incision was made along the anterior tonsillar pillar using Bovie electrocautery on a setting of 20.  Dissection continued in the subcapsular plane until the tonsil was removed.  The tonsil  was passed to nursing for pathology.   Bleeding was then controlled using suction cautery on a setting of 30.  After this was completed, the nose and throat were copiously irrigated with saline and a flexible catheter was passed down the esophagus to  suction out stomach and esophagus.  The retractor was taken out of suspension and removed from the patient's mouth.  She was turned back to anesthesia for wakeup and was extubated and taken to the recovery room in stable condition.  CN/NUANCE  D:12/28/2019 T:12/28/2019 JOB:[id number]

## 2019-12-28 NOTE — Brief Op Note (Signed)
12/28/2019  8:04 AM  PATIENT:  [name]  86 y.o. female  PRE-OPERATIVE DIAGNOSIS:  ASYMMETRY OF RIGHT TONSIL  POST-OPERATIVE DIAGNOSIS:  ASYMMETRY OF RIGHT TONSIL  PROCEDURE:  Procedure(s): TONSILLECTOMY (Right)  SURGEON:  Surgeon(s) and Role:    Melida Quitter, MD - Primary  PHYSICIAN ASSISTANT:   ASSISTANTS: none   ANESTHESIA:   general  EBL: Minimal  BLOOD ADMINISTERED:none  DRAINS: none   LOCAL MEDICATIONS USED:  NONE  SPECIMEN:  Source of Specimen:  Right tonsil  DISPOSITION OF SPECIMEN:  PATHOLOGY  COUNTS:  YES  TOURNIQUET:  * No tourniquets in log *  DICTATION: .Other Dictation: Dictation Number [id number]  PLAN OF CARE: Admit for overnight observation  PATIENT DISPOSITION:  PACU - hemodynamically stable.   Delay start of Pharmacological VTE agent (>24hrs) due to surgical blood loss or risk of bleeding: yes

## 2019-12-28 NOTE — Discharge Instructions (Signed)

## 2019-12-28 NOTE — Transfer of Care (Signed)
Immediate Anesthesia Transfer of Care Note  Patient: [name]  Procedure(s) Performed: TONSILLECTOMY (Right Throat)  Patient Location: PACU  Anesthesia Type:General  Level of Consciousness: sedated  Airway & Oxygen Therapy: Patient Spontanous Breathing and Patient connected to face mask oxygen  Post-op Assessment: Report given to RN and Post -op Vital signs reviewed and stable  Post vital signs: Reviewed and stable  Last Vitals:  Vitals Value Taken Time  BP 148/81 12/28/19 0819  Temp    Pulse 73 12/28/19 0821  Resp 27 12/28/19 0821  SpO2 100 % 12/28/19 0821  Vitals shown include unvalidated device data.  Last Pain:  Vitals:   12/28/19 0707  TempSrc: Tympanic  PainSc: 0-No pain      Patients Stated Pain Goal: 0 ([id number])  Complications: No apparent anesthesia complications

## 2019-12-28 NOTE — Anesthesia Procedure Notes (Signed)
Procedure Name: Intubation Date/Time: 12/28/2019 7:50 AM Performed by: Gwyndolyn Saxon, CRNA Pre-anesthesia Checklist: Patient identified, Suction available, Emergency Drugs available, Patient being monitored and Timeout performed Patient Re-evaluated:Patient Re-evaluated prior to induction Oxygen Delivery Method: Circle system utilized Preoxygenation: Pre-oxygenation with 100% oxygen Induction Type: IV induction Ventilation: Mask ventilation without difficulty Laryngoscope Size: Miller and 2 Grade View: Grade I Tube type: Oral Tube size: 7.0 mm Number of attempts: 1 Airway Equipment and Method: Stylet Placement Confirmation: ETT inserted through vocal cords under direct vision,  positive ETCO2 and breath sounds checked- equal and bilateral Secured at: 21 cm Tube secured with: Tape Dental Injury: Teeth and Oropharynx as per pre-operative assessment

## 2019-12-29 ENCOUNTER — Encounter: Payer: Self-pay | Admitting: *Deleted

## 2019-12-29 ENCOUNTER — Other Ambulatory Visit: Payer: Self-pay

## 2019-12-30 LAB — SURGICAL PATHOLOGY

## 2020-01-01 DIAGNOSIS — C099 Malignant neoplasm of tonsil, unspecified: Secondary | ICD-10-CM | POA: Insufficient documentation

## 2020-01-12 ENCOUNTER — Telehealth: Payer: Self-pay | Admitting: *Deleted

## 2020-01-12 NOTE — Telephone Encounter (Signed)
Patient didn't want to schedule appointment with Dr. Isidore Moos until she saw Dr. Conley Canal tomorrow.

## 2020-02-24 ENCOUNTER — Other Ambulatory Visit: Payer: Self-pay

## 2020-02-24 ENCOUNTER — Ambulatory Visit: Payer: Medicare PPO | Attending: Otolaryngology | Admitting: Speech Pathology

## 2020-02-24 DIAGNOSIS — R1312 Dysphagia, oropharyngeal phase: Secondary | ICD-10-CM

## 2020-02-24 NOTE — Patient Instructions (Signed)
  When you swallow:  No distractions  Head turn to the right with chin down  Keep chin down when you swallow  Cough or clear your throat intermittently, then head turn and down and swallow   Exercises:  Swallow hard with big effort, squeeze your neck and tongue muscles - you can do this with or without a sip of water 15x twice a day  Move your tongue in a circle clockwise over top and bottom teeth 15x twice a day  Move your tongue counter clockwise over top and bottom teeth 15x twice a day

## 2020-02-29 ENCOUNTER — Encounter: Payer: Self-pay | Admitting: Speech Pathology

## 2020-02-29 NOTE — Therapy (Signed)
[facility] [address] [place], [place], [address] Phone: [phone]   Fax:  [phone]  Speech Language Pathology Evaluation  Patient Details  Name: [name] MRN: [id number] Date of Birth: Jun 08, 1933 Referring Provider (SLP): Dr. Wendall Mola   Encounter Date: 02/24/2020  End of Session - 02/29/20 1215    Visit Number  1   Number of Visits  17    Date for SLP Re-Evaluation  04/25/20       Past Medical History:  Diagnosis Date  . Allergies   . Allergy   . Anxiety   . Arthritis   . Atrophic vaginitis   . Cataract    hx/o surgery, both eyes; Dr. Nile Riggs  . Complication of anesthesia   . Diverticulosis    per 2010 colonoscopy  . Dyslipidemia   . Full dentures   . GERD (gastroesophageal reflux disease)    hx/o, resolved  . Glaucoma 2016  . H/O echocardiogram 06/22/2010   normal LVEF, moderate mitral and tricuspid regurg 07/2016 Dr. Rennis Golden;  mild aortic valve stenosis, left ventricular normal, EF 55%; Dr. Allyson Sabal  06/2010  . History of cardiovascular stress test 06/22/2010   normal bruce myocadial perfusion study, 72% EF; Dr. Allyson Sabal  . History of mammogram    benign bilat calcifications, heterogeneously dense breasts, stable mammograms 2009-2014  . 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 De Blanch gyencology oncology [place]; Dr. Sylvester Harder gynecology  . Hypertension   . Lactose intolerance   . 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 Alanda Amass  . Osteoporosis   . PONV (postoperative nausea and vomiting)   . SVT (supraventricular tachycardia) (HCC)    asymptomatic 2014, hx/o SVT, Dr. Alanda Amass  . Vitamin D deficiency     Past Surgical History:  Procedure Laterality  Date  . ABDOMINAL HYSTERECTOMY Bilateral    h/o uterine cancer; oophrectomy  . CATARACT EXTRACTION     bilat  . COLONOSCOPY  08/2016   08/2016  Dr. Elnoria Howard advised no repeat colonoscopy;  scattered diverticla, medium hemorrhoids 2010; Dr. Elnoria Howard  . JOINT REPLACEMENT    . NM MYOCAR PERF WALL MOTION    . TONSILLECTOMY Right 12/28/2019   Procedure: TONSILLECTOMY;  Surgeon: Christia Reading, MD;  Location:  SURGERY CENTER;  Service: ENT;  Laterality: Right;  . TOTAL HIP ARTHROPLASTY Bilateral 2008, 2010    twice left (complication on the left, once on the right; Dr. Dorene Grebe  . TRANSTHORACIC ECHOCARDIOGRAM  02/2013   ordered for SVT; EF 55-65%; calcified MV annulus, mod MR; RSVP increased; RA mildly dilated; mod TR; PA peak pressure    There were no vitals filed for this visit.  Subjective Assessment - 02/29/20 1046    Subjective  "I;m eating more things now"         SLP Evaluation [facility] - 02/29/20 1039      SLP Visit Information   SLP Received On  02/24/20    Referring Provider (SLP)  Dr. Wendall Mola    Onset Date  02/11/20    Medical Diagnosis  tonsil cancer; s/p resection      Subjective   Patient/Family Stated Goal  To get my swallowing better"      General Information   HPI  :  [name] was taken to the operating room on 02/11/2020 where she underwent the above mentioned procedure(s) without complication. She did well postoperatively and was monitored in a regular floor bed after being observed for a short while in PACU and deemed stable for transfer. She remained afebrile and hemodynamically stable throughout hospitalization. She was transitioned to a full liquid diet which was well tolerated without nausea or vomiting. POD3 DHT was removed and oral intake continued to improve. Pain control was achieved on PO pain medications. She continued to have a normal UOP with normal vital signs for this patient. She was alert and oriented throughout  hospitalization. She will be discharged with 1 JP drain. 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 Note narrow pharyngeal space secondary to recent surgery. Dysphagia is characterized by velopharyngeal insufficiency, reduced base of tongue retraction and pharyngeal constriction R>L, and decreased supraglottic closure. Deficits result in moderate to severe post-swallow bolus residual in the valleculae R>L, worse with increased consistencies; laryngeal penetration of thin liquid during the swallow that minimizes with the completion of the swallow, subsequent swallowing, or reflexive throat clearing; and laryngeal penetration across consistencies after the swallow that clears with reflexive throat clearing. No aspiration observed upon thorough challenging. Head turn right with slight chin tuck is partially effective in improving pharyngeal clearance. Liquid wash is also partially effective in minimizing residual. Based on today's evaluation, recommend patient consume a very soft solid vs blended diet with thin liquids and strict adherence to aspiration precautions below. Recommend improved nutrition and hydration with liquid supplements as needed. Additionally, recommend trial of outpatient swallowing therapy-    Mobility Status  walks independently      Balance Screen   Has the patient fallen in the past 6 months  No    Has the patient had a decrease in activity level because of a fear of falling?   No    Is the patient reluctant to leave their home because of a fear of falling?   No      Prior Functional Status   Cognitive/Linguistic Baseline  Within functional limits    Type of Home  House     Lives With  Spouse    Available Support  Family    Vocation  Retired      IT consultant   Overall Cognitive Status  Within Functional Limits for tasks assessed      Auditory Comprehension   Overall Auditory Comprehension   Appears within functional limits for tasks assessed      Verbal Expression   Overall Verbal Expression  Appears within functional limits for tasks assessed      Oral Motor/Sensory Function   Overall Oral Motor/Sensory Function  Impaired    Labial ROM  Within Functional Limits    Labial Symmetry  Within Functional Limits    Labial Strength  Within Functional Limits    Labial Coordination  WFL    Lingual Symmetry  Abnormal symmetry right    Lingual Strength  Reduced Right    Lingual Coordination  Reduced    Facial ROM  Within Functional Limits    Velum  Impaired right    Mandible  Within Functional Limits      Motor Speech   Overall Motor Speech  Appears within functional limits for tasks assessed                      SLP  Education - 02/29/20 1048    Education Details  HEP for dysphagia; swallow precautions; head turn right with chin down    Person(s) Educated  Patient;Child(ren)    Methods  Explanation;Demonstration;Verbal cues;Handout    Comprehension  Verbalized understanding;Returned demonstration;Verbal cues required;Need further instruction       SLP Short Term Goals - 02/29/20 1212      SLP SHORT TERM GOAL #1   Title  Pt will complete HEP for dysphagia with occasional min A over 2 sessions    Time  4    Period  Weeks    Status  New      SLP SHORT TERM GOAL #2   Title  Pt will follow swallow precautions with rare min A over 2 sessions    Time  4    Period  Weeks    Status  New      SLP SHORT TERM GOAL #3   Title  Pt/family witll verbalize s/s of aspiration pna with rare min A    Time  4    Period  Weeks    Status  New       SLP Long Term Goals - 02/29/20 1213      SLP LONG TERM GOAL #1   Title  Pt will complete HEP for dysphagia with rare min A over 2 sessions    Time  4    Period  Weeks    Status  New      SLP LONG TERM GOAL #2   Title  Pt will follow swallow precautions with mod I over 2 sessions    Time  8    Period  Weeks     Status  New      SLP LONG TERM GOAL #3   Title  Pt will tolerate Dysphagia 2 (fine chop) diet with no over s/s of aspiration.    Time  8    Period  Weeks    Status  New       Plan - 02/29/20 1050    Clinical Impression Statement  Mrs. Winebrenner is referred for outpt swallowing therapy s/p FEES 02/17/20 and surgery for tonsil cancer. See details in HPI section. She is accompanied by her daughter in law, [name]. Mrs. Demo is consuming puree solids and thin liquids. She affirms coughing PO. She is taking thin liquids through as nasal mist bottle because she "feels like it gives her a safe amount"  PO trials of puree and thin liquids via cup revealed prolonged oral phase and no overt s/s of aspiration on clinical swallow evaluation. Liquid amount recommendation is cup sip. I encougaged Mrs. Bresnan to use a cup to maximize hydration, rather than take liqids with a mister. Mrs. Noble verbalized that she was told to turn her head to the right, tuck her chin, then bring her chin back up and swallow. I re-educated her that she is to keep her chin down and swallow with her chin down according to FEES recommendations. She required usual mod A to follow these postures. Mrs. Kovatch is taking more variety of food, including pureed meats. I initiated training for HEP for swallow - see pt instructions - will continue to add to this. Recommend skilled ST to maximize carryover of swallow precautions and safety of swallow    Speech Therapy Frequency  2x / week    Duration  --   8 weeks or 17 visits   Treatment/Interventions  Aspiration precaution training;Pharyngeal strengthening exercises;Diet toleration management by  SLP;Trials of upgraded texture/liquids;Environmental controls;Compensatory techniques;Other (comment);Oral motor exercises;Internal/external aids;Patient/family education   repeat FEES or MBSS - will order if indicated   Potential to Achieve Goals  Good    SLP Home Exercise Plan  Effortful swallow,  lingual sweet    Consulted and Agree with Plan of Care  Patient;Family member/caregiver   daughter in law      Patient will benefit from skilled therapeutic intervention in order to improve the following deficits and impairments:   Dysphagia, oropharyngeal phase - Plan: SLP plan of care cert/re-cert    Problem List Patient Active Problem List   Diagnosis Date Noted  . Abnormal chest x-ray 08/12/2019  . Encounter for health maintenance examination in adult 08/12/2019  . Need for influenza vaccination 08/12/2019  . Chest wall pain 06/11/2019  . Muscle strain 06/11/2019  . Abnormal EKG 06/11/2019  . Murmur 09/05/2018  . Aortic atherosclerosis (HCC) 08/04/2018  . Elevated ferritin 08/04/2018  . Other fatigue 07/14/2018  . Iron disorder 07/14/2018  . Glaucoma 07/11/2017  . Vaccine counseling 07/11/2017  . Varicose vein of leg 07/11/2017  . Need for shingles vaccine 07/11/2017  . Hearing loss 06/27/2016  . Medicare annual wellness visit, subsequent 06/26/2016  . Osteoporosis 01/12/2016  . Dyslipidemia 06/14/2015  . Essential hypertension 06/14/2015  . Mitral valve regurgitation 06/14/2015  . Tricuspid valve insufficiency 06/14/2015  . Vitamin D deficiency 06/14/2015  . Rhinitis, allergic 06/14/2015  . Renal insufficiency 06/14/2015  . 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 Lown Maryla Morrow syndrome 03/23/2014  . AVNRT (AV nodal re-entry tachycardia) (HCC) 03/23/2014    Miranda Garber, Radene Journey MS, CCC-SLP 02/29/2020, 12:23 PM  [facility] [address] [place], [place], [address] Phone: [phone]   Fax:  [phone]  Name: KWAME CARRO MRN: [id number] Date of Birth: Dec 06, 1933

## 2020-02-29 NOTE — Therapy (Deleted)
[facility] [address] [place], [place], [address] Phone: [phone]   Fax:  [phone]  Speech Language Pathology Treatment  Patient Details  Name: [name] MRN: [id number] Date of Birth: 05/24/1933 Referring Provider (SLP): Dr. Fredricka Bonine   Encounter Date: 02/24/2020  End of Session - 02/29/20 1215    Visit Number  2    Number of Visits  17    Date for SLP Re-Evaluation  04/25/20       Past Medical History:  Diagnosis Date  . Allergies   . Allergy   . Anxiety   . Arthritis   . Atrophic vaginitis   . Cataract    hx/o surgery, both eyes; Dr. Gershon Crane  . Complication of anesthesia   . Diverticulosis    per 2010 colonoscopy  . Dyslipidemia   . Full dentures   . GERD (gastroesophageal reflux disease)    hx/o, resolved  . Glaucoma 2016  . H/O echocardiogram 06/22/2010   normal LVEF, moderate mitral and tricuspid regurg 07/2016 [name];  mild aortic valve stenosis, left ventricular normal, EF 55%; Dr. Gwenlyn Found  06/2010  . History of cardiovascular stress test 06/22/2010   normal bruce myocadial perfusion study, 72% EF; Dr. Gwenlyn Found  . History of mammogram    benign bilat calcifications, heterogeneously dense breasts, stable mammograms 2009-2014  . 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 Marti Sleigh gyencology onc[facility] [place]; Dr. Lorriane Shire gynecology  . Hypertension   . Lactose intolerance   . 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 Rollene Fare  . Osteoporosis   . PONV (postoperative nausea and vomiting)   . SVT (supraventricular tachycardia) ([place])    asymptomatic 2014, hx/o SVT, Dr. Rollene Fare  . Vitamin D deficiency     Past Surgical History:  Procedure Laterality  Date  . ABDOMINAL HYSTERECTOMY Bilateral    h/o uterine cancer; oophrectomy  . CATARACT EXTRACTION     bilat  . COLONOSCOPY  08/2016   08/2016  Dr. Benson Norway advised no repeat colonoscopy;  scattered diverticla, medium hemorrhoids 2010; Dr. Benson Norway  . JOINT REPLACEMENT    . NM MYOCAR PERF WALL MOTION    . TONSILLECTOMY Right 12/28/2019   Procedure: TONSILLECTOMY;  Surgeon: Melida Quitter, MD;  Location: [facility];  Service: ENT;  Laterality: Right;  . TOTAL HIP ARTHROPLASTY Bilateral 2008, 2010    twice left (complication on the left, once on the right; Dr. Alphonzo Severance  . TRANSTHORACIC ECHOCARDIOGRAM  02/2013   ordered for SVT; EF 55-65%; calcified MV annulus, mod MR; RSVP increased; RA mildly dilated; mod TR; PA peak pressure 58mmHg    There were no vitals filed for this visit.  Subjective Assessment - 02/29/20 1046    Subjective  "I;m eating more things now"        SLP Evaluation [facility] - 02/29/20 1039      SLP Visit Information   SLP Received On  02/24/20    Referring Provider (SLP)  Dr. Fredricka Bonine    Onset Date  02/11/20    Medical Diagnosis  tonsil cancer; s/p resection      Subjective   Patient/Family Stated Goal  To get my swallowing better"      General Information   HPI  :  [facility] [address] [place], [place], [address] Phone: [phone]   Fax:  [phone]  Speech Language Pathology Treatment  Patient Details  Name: [name] MRN: [id number] Date of Birth: 05/24/1933 Referring Provider (SLP): Dr. Fredricka Bonine   Encounter Date: 02/24/2020  End of Session - 02/29/20 1215    Visit Number  2    Number of Visits  17    Date for SLP Re-Evaluation  04/25/20       Past Medical History:  Diagnosis Date  . Allergies   . Allergy   . Anxiety   . Arthritis   . Atrophic vaginitis   . Cataract    hx/o surgery, both eyes; Dr. Gershon Crane  . Complication of anesthesia   . Diverticulosis    per 2010 colonoscopy  . Dyslipidemia   . Full dentures   . GERD (gastroesophageal reflux disease)    hx/o, resolved  . Glaucoma 2016  . H/O echocardiogram 06/22/2010   normal LVEF, moderate mitral and tricuspid regurg 07/2016 [name];  mild aortic valve stenosis, left ventricular normal, EF 55%; Dr. Gwenlyn Found  06/2010  . History of cardiovascular stress test 06/22/2010   normal bruce myocadial perfusion study, 72% EF; Dr. Gwenlyn Found  . History of mammogram    benign bilat calcifications, heterogeneously dense breasts, stable mammograms 2009-2014  . 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 Marti Sleigh gyencology oncology [place]; Dr. Lorriane Shire gynecology  . Hypertension   . Lactose intolerance   . 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 Rollene Fare  . Osteoporosis   . PONV (postoperative nausea and vomiting)   . SVT (supraventricular tachycardia) ([place])    asymptomatic 2014, hx/o SVT, Dr. Rollene Fare  . Vitamin D deficiency     Past Surgical History:  Procedure Laterality  Date  . ABDOMINAL HYSTERECTOMY Bilateral    h/o uterine cancer; oophrectomy  . CATARACT EXTRACTION     bilat  . COLONOSCOPY  08/2016   08/2016  Dr. Benson Norway advised no repeat colonoscopy;  scattered diverticla, medium hemorrhoids 2010; Dr. Benson Norway  . JOINT REPLACEMENT    . NM MYOCAR PERF WALL MOTION    . TONSILLECTOMY Right 12/28/2019   Procedure: TONSILLECTOMY;  Surgeon: Melida Quitter, MD;  Location: [facility];  Service: ENT;  Laterality: Right;  . TOTAL HIP ARTHROPLASTY Bilateral 2008, 2010    twice left (complication on the left, once on the right; Dr. Alphonzo Severance  . TRANSTHORACIC ECHOCARDIOGRAM  02/2013   ordered for SVT; EF 55-65%; calcified MV annulus, mod MR; RSVP increased; RA mildly dilated; mod TR; PA peak pressure 58mmHg    There were no vitals filed for this visit.  Subjective Assessment - 02/29/20 1046    Subjective  "I;m eating more things now"        SLP Evaluation [facility] - 02/29/20 1039      SLP Visit Information   SLP Received On  02/24/20    Referring Provider (SLP)  Dr. Fredricka Bonine    Onset Date  02/11/20    Medical Diagnosis  tonsil cancer; s/p resection      Subjective   Patient/Family Stated Goal  To get my swallowing better"      General Information   HPI  :  8 weeks or 17 visits   Treatment/Interventions  Aspiration precaution training;Pharyngeal strengthening exercises;Diet toleration management by SLP;Trials of upgraded texture/liquids;Environmental controls;Compensatory techniques;Other (comment);Oral motor exercises;Internal/external  aids;Patient/family education   repeat FEES or MBSS - will order if indicated   Potential to Achieve Goals  Good    SLP Home Exercise Plan  Effortful swallow, lingual sweet    Consulted and Agree with Plan of Care  Patient;Family member/caregiver   daughter in law      Patient will benefit from skilled therapeutic intervention in order to improve the following deficits and impairments:   Dysphagia, oropharyngeal phase    Problem List Patient Active Problem List   Diagnosis Date Noted  . Abnormal chest x-ray 08/12/2019  . Encounter for health maintenance examination in adult 08/12/2019  . Need for influenza vaccination 08/12/2019  . Chest wall pain 06/11/2019  . Muscle strain 06/11/2019  . Abnormal EKG 06/11/2019  . Murmur 09/05/2018  . Aortic atherosclerosis ([place]) 08/04/2018  . Elevated ferritin 08/04/2018  . Other fatigue 07/14/2018  . Iron disorder 07/14/2018  . Glaucoma 07/11/2017  . Vaccine counseling 07/11/2017  . Varicose vein of leg 07/11/2017  . Need for shingles vaccine 07/11/2017  . Hearing loss 06/27/2016  . Medicare annual wellness visit, subsequent 06/26/2016  . Osteoporosis 01/12/2016  . Dyslipidemia 06/14/2015  . Essential hypertension 06/14/2015  . Mitral valve regurgitation 06/14/2015  . Tricuspid valve insufficiency 06/14/2015  . Vitamin D deficiency 06/14/2015  . Rhinitis, allergic 06/14/2015  . Renal insufficiency 06/14/2015  . 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 AVNRT (AV nodal re-entry tachycardia) ([place]) 03/23/2014    Louisa Favaro, Annye Rusk MS, CCC-SLP 02/29/2020, 12:15 PM  [facility] [address] [place], [place], [address] Phone: [phone]   Fax:  [phone]   Name: [name] MRN: [id number] Date of Birth: 1932-12-19  8 weeks or 17 visits   Treatment/Interventions  Aspiration precaution training;Pharyngeal strengthening exercises;Diet toleration management by SLP;Trials of upgraded texture/liquids;Environmental controls;Compensatory techniques;Other (comment);Oral motor exercises;Internal/external  aids;Patient/family education   repeat FEES or MBSS - will order if indicated   Potential to Achieve Goals  Good    SLP Home Exercise Plan  Effortful swallow, lingual sweet    Consulted and Agree with Plan of Care  Patient;Family member/caregiver   daughter in law      Patient will benefit from skilled therapeutic intervention in order to improve the following deficits and impairments:   Dysphagia, oropharyngeal phase    Problem List Patient Active Problem List   Diagnosis Date Noted  . Abnormal chest x-ray 08/12/2019  . Encounter for health maintenance examination in adult 08/12/2019  . Need for influenza vaccination 08/12/2019  . Chest wall pain 06/11/2019  . Muscle strain 06/11/2019  . Abnormal EKG 06/11/2019  . Murmur 09/05/2018  . Aortic atherosclerosis ([place]) 08/04/2018  . Elevated ferritin 08/04/2018  . Other fatigue 07/14/2018  . Iron disorder 07/14/2018  . Glaucoma 07/11/2017  . Vaccine counseling 07/11/2017  . Varicose vein of leg 07/11/2017  . Need for shingles vaccine 07/11/2017  . Hearing loss 06/27/2016  . Medicare annual wellness visit, subsequent 06/26/2016  . Osteoporosis 01/12/2016  . Dyslipidemia 06/14/2015  . Essential hypertension 06/14/2015  . Mitral valve regurgitation 06/14/2015  . Tricuspid valve insufficiency 06/14/2015  . Vitamin D deficiency 06/14/2015  . Rhinitis, allergic 06/14/2015  . Renal insufficiency 06/14/2015  . 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 AVNRT (AV nodal re-entry tachycardia) ([place]) 03/23/2014    Louisa Favaro, Annye Rusk MS, CCC-SLP 02/29/2020, 12:15 PM  [facility] [address] [place], [place], [address] Phone: [phone]   Fax:  [phone]   Name: [name] MRN: [id number] Date of Birth: 1932-12-19

## 2020-03-02 ENCOUNTER — Ambulatory Visit: Payer: Medicare PPO | Admitting: Speech Pathology

## 2020-03-02 ENCOUNTER — Encounter: Payer: Self-pay | Admitting: Speech Pathology

## 2020-03-02 ENCOUNTER — Other Ambulatory Visit: Payer: Self-pay

## 2020-03-02 DIAGNOSIS — R1312 Dysphagia, oropharyngeal phase: Secondary | ICD-10-CM | POA: Diagnosis not present

## 2020-03-02 NOTE — Patient Instructions (Signed)
  Keep up head turn right and chin down  Alternate solids and liquids  Use hard swallow when you eat and drink  Consider trying some very soft foods with a little texture like soft mac or soft meat - you should chew until the food becomes puree in your mouth   SWALLOWING EXERCISES 1. Effortful Swallows - Squeeze hard with the muscles in your neck while you swallow your  saliva or a sip of water - Repeat 10 times, 2 times a day, and use whenever you eat or drink  2. Tongue Press - Press your entire tongue as hard as you can against the roof of your mouth for 3-5 seconds - Repeat 15 times, 2 times a day  3. Tongue Stretch/Teeth Clean - Move your tongue around the pocket between your gums and teeth, clockwise and then counter-clockwise - Repeat on the back side, clockwise and then counter-clockwise - Repeat 15 times, 2 times a day   11. Stick out your tongue and say "GA-GA-GA" loud and shart           -Repeat 10 sets of 2, 2 times a day  12. Say ING-GA loud and exaggerated             - 15 times 2 times a day  If any of this hurts don't do it

## 2020-03-02 NOTE — Therapy (Signed)
[facility] [address] [place], [place], [address] Phone: [phone]   Fax:  [phone]  Speech Language Pathology Treatment  Patient Details  Name: [name] MRN: [id number] Date of Birth: [birth year] Referring Provider (SLP): Dr. Wendall Mola   Encounter Date: 03/02/2020    Past Medical History:  Diagnosis Date  . Allergies   . Allergy   . Anxiety   . Arthritis   . Atrophic vaginitis   . Cataract    hx/o surgery, both eyes; Dr. Nile Riggs  . Complication of anesthesia   . Diverticulosis    per 2010 colonoscopy  . Dyslipidemia   . Full dentures   . GERD (gastroesophageal reflux disease)    hx/o, resolved  . Glaucoma 2016  . H/O echocardiogram 06/22/2010   normal LVEF, moderate mitral and tricuspid regurg 07/2016 Dr. Rennis Golden;  mild aortic valve stenosis, left ventricular normal, EF 55%; Dr. Allyson Sabal  06/2010  . History of cardiovascular stress test 06/22/2010   normal bruce myocadial perfusion study, 72% EF; Dr. Allyson Sabal  . History of mammogram    benign bilat calcifications, heterogeneously dense breasts, stable mammograms 2009-2014  . 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 De Blanch gyencology oncology [place]; Dr. Sylvester Harder gynecology  . Hypertension   . Lactose intolerance   . 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 Alanda Amass  . Osteoporosis   . PONV (postoperative nausea and vomiting)   . SVT (supraventricular tachycardia) ([facility])    asymptomatic 2014, hx/o SVT, Dr. Alanda Amass  . Vitamin D deficiency     Past Surgical History:  Procedure Laterality Date  . ABDOMINAL HYSTERECTOMY Bilateral    h/o uterine cancer; oophrectomy  . CATARACT EXTRACTION     bilat  .  COLONOSCOPY  08/2016   08/2016  Dr. Elnoria Howard advised no repeat colonoscopy;  scattered diverticla, medium hemorrhoids 2010; Dr. Elnoria Howard  . JOINT REPLACEMENT    . NM MYOCAR PERF WALL MOTION    . TONSILLECTOMY Right 12/28/2019   Procedure: TONSILLECTOMY;  Surgeon: Christia Reading, MD;  Location: [facility];  Service: ENT;  Laterality: Right;  . TOTAL HIP ARTHROPLASTY Bilateral 2008, 2010    twice left (complication on the left, once on the right; Dr. Dorene Grebe  . TRANSTHORACIC ECHOCARDIOGRAM  02/2013   ordered for SVT; EF 55-65%; calcified MV annulus, mod MR; RSVP increased; RA mildly dilated; mod TR; PA peak pressure    There were no vitals filed for this visit.  Subjective Assessment - 03/02/20 1357    Subjective  "I told my husband not to interrput me while I was eating and I turn off the TV"    Patient is accompained by:  Family member   daughter in law, [name]   Currently in Pain?  No/denies            ADULT SLP TREATMENT - 03/02/20 1357      General Information   Behavior/Cognition  Alert;Cooperative;Pleasant mood      Treatment Provided   Treatment provided  Dysphagia      Dysphagia Treatment   Temperature Spikes Noted  No    Respiratory Status  Nasal cannula    Oral Cavity - Dentition  Adequate natural dentition    Treatment Methods  Skilled observation;Therapeutic exercise;Compensation strategy training;Patient/caregiver education    Patient observed directly with PO's  Yes    Type of PO's observed  Dysphagia 1 (puree);Thin liquids    Feeding  Able to feed self    Liquids provided via  Cup    Pharyngeal Phase Signs & Symptoms  Delayed throat clear;Delayed cough    Type of cueing  Verbal    Amount of cueing  Moderate    Other treatment/comments  Pt brought in puree pear and water. She demonstrated swallow precautions of head turn right and chin tuck with rare min A. Usual cues and explanation to alterate solids and liqiuds. Added 2 exercises to HEP -  see pt instructions. Pt required occasional min cues and frequent modeling to complete accurately. She demonstrates effortful swallow and ligual sweet with mod I. Pt tried some mac and cheese she mashed with a fork and small pieces of deli [place] at home with success. I provided handout and explanation of Dysphagia 2 diet, encouraged her to try some things she feels comfortable trying . Instructed her to chew until bolus is puree in her mouth prior to swallowing.       Assessment / Recommendations / Plan   Plan  Continue with current plan of care      Dysphagia Recommendations   Diet recommendations  Dysphagia 2 (fine chop)    Liquids provided via  Cup    Medication Administration  Crushed with puree    Supervision  Patient able to self feed    Compensations  Slow rate;Small sips/bites;Follow solids with liquid;Clear throat intermittently;Effortful swallow    Postural Changes and/or Swallow Maneuvers  Head turn right during swallow;Chin tuck;Upright 30-60 min after meal      Progression Toward Goals   Progression toward goals  Progressing toward goals         SLP Short Term Goals - 03/02/20 1452      SLP SHORT TERM GOAL #1   Title  Pt will complete HEP for dysphagia with occasional min A over 2 sessions    Time  4    Period  Weeks    Status  On-going      SLP SHORT TERM GOAL #2   Title  Pt will follow swallow precautions with rare min A over 2 sessions    Time  4    Period  Weeks    Status  On-going      SLP SHORT TERM GOAL #3   Title  Pt/family witll verbalize s/s of aspiration pna with rare min A    Time  4    Period  Weeks    Status  On-going       SLP Long Term Goals - 03/02/20 1453      SLP LONG TERM GOAL #1   Title  Pt will complete HEP for dysphagia with rare min A over 2 sessions    Time  8    Period  Weeks    Status  On-going      SLP LONG TERM GOAL #2   Title  Pt will follow swallow precautions with mod I over 2 sessions    Time  8    Period  Weeks     Status  On-going      SLP LONG TERM GOAL #3   Title  Pt will tolerate Dysphagia 2 (fine chop) diet with no over s/s of aspiration.    Time  8    Period  Weeks  Status  New       Plan - 03/02/20 1451    Clinical Impression Statement  Mrs. Marrero is referred for outpt swallowing therapy s/p FEES 02/17/20 and surgery for tonsil cancer. See details in HPI section. She is accompanied by her daughter in law, [name]. Mrs. Grondahl is consuming puree solids and thin liquids. She affirms coughing PO. She is taking thin liquids through as nasal mist bottle because she "feels like it gives her a safe amount"  PO trials of puree and thin liquids via cup revealed prolonged oral phase and no overt s/s of aspiration on clinical swallow evaluation. Liquid amount recommendation is cup sip. I encougaged Mrs. Camuso to use a cup to maximize hydration, rather than take liqids with a mister. Mrs. Leisey verbalized that she was told to turn her head to the right, tuck her chin, then bring her chin back up and swallow. I re-educated her that she is to keep her chin down and swallow with her chin down according to FEES recommendations. She required usual mod A to follow these postures. Mrs. Plager is taking more variety of food, including pureed meats. I initiated training for HEP for swallow - see pt instructions - will continue to add to this. Recommend skilled ST to maximize carryover of swallow precautions and safety of swallow    Speech Therapy Frequency  2x / week    Duration  --   8 weeks or 17 visits   Treatment/Interventions  Aspiration precaution training;Pharyngeal strengthening exercises;Diet toleration management by SLP;Trials of upgraded texture/liquids;Environmental controls;Compensatory techniques;Other (comment);Oral motor exercises;Internal/external aids;Patient/family education    Potential to Achieve Goals  Good    SLP Home Exercise Plan  Effortful swallow, lingual sweep    Consulted and Agree with Plan  of Care  Patient;Family member/caregiver   daughter in Dispensing optician      Patient will benefit from skilled therapeutic intervention in order to improve the following deficits and impairments:   Dysphagia, oropharyngeal phase    Problem List Patient Active Problem List   Diagnosis Date Noted  . Abnormal chest x-ray 08/12/2019  . Encounter for health maintenance examination in adult 08/12/2019  . Need for influenza vaccination 08/12/2019  . Chest wall pain 06/11/2019  . Muscle strain 06/11/2019  . Abnormal EKG 06/11/2019  . Murmur 09/05/2018  . Aortic atherosclerosis ([facility]) 08/04/2018  . Elevated ferritin 08/04/2018  . Other fatigue 07/14/2018  . Iron disorder 07/14/2018  . Glaucoma 07/11/2017  . Vaccine counseling 07/11/2017  . Varicose vein of leg 07/11/2017  . Need for shingles vaccine 07/11/2017  . Hearing loss 06/27/2016  . Medicare annual wellness visit, subsequent 06/26/2016  . Osteoporosis 01/12/2016  . Dyslipidemia 06/14/2015  . Essential hypertension 06/14/2015  . Mitral valve regurgitation 06/14/2015  . Tricuspid valve insufficiency 06/14/2015  . Vitamin D deficiency 06/14/2015  . Rhinitis, allergic 06/14/2015  . Renal insufficiency 06/14/2015  . 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 Lown Maryla Morrow syndrome 03/23/2014  . AVNRT (AV nodal re-entry tachycardia) ([facility]) 03/23/2014    Salvador Coupe, Radene Journey MS, CCC-SLP 03/02/2020, 2:53 PM  [facility] [address] [place], [place], [address] Phone: [phone]   Fax:  [phone]   Name: MARTIN UMPIERRE MRN: [id number] Date of Birth: [birth year]

## 2020-03-07 ENCOUNTER — Other Ambulatory Visit: Payer: Self-pay

## 2020-03-07 ENCOUNTER — Ambulatory Visit: Payer: Medicare PPO

## 2020-03-07 DIAGNOSIS — R1312 Dysphagia, oropharyngeal phase: Secondary | ICD-10-CM | POA: Diagnosis not present

## 2020-03-07 NOTE — Therapy (Signed)
[facility] [address] [place], [place], [address] Phone: [phone]   Fax:  [phone]  Speech Language Pathology Treatment  Patient Details  Name: [name] MRN: [id number] Date of Birth: May 31, 1933 Referring Provider (SLP): Dr. Wendall Mola   Encounter Date: 03/07/2020  End of Session - 03/07/20 0921    Visit Number  4    Number of Visits  17    Date for SLP Re-Evaluation  04/25/20    SLP Start Time  0803    SLP Stop Time   0845    SLP Time Calculation (min)  42 min    Activity Tolerance  Patient tolerated treatment well       Past Medical History:  Diagnosis Date  . Allergies   . Allergy   . Anxiety   . Arthritis   . Atrophic vaginitis   . Cataract    hx/o surgery, both eyes; Dr. Nile Riggs  . Complication of anesthesia   . Diverticulosis    per 2010 colonoscopy  . Dyslipidemia   . Full dentures   . GERD (gastroesophageal reflux disease)    hx/o, resolved  . Glaucoma 2016  . H/O echocardiogram 06/22/2010   normal LVEF, moderate mitral and tricuspid regurg 07/2016 Dr. Rennis Golden;  mild aortic valve stenosis, left ventricular normal, EF 55%; Dr. Allyson Sabal  06/2010  . History of cardiovascular stress test 06/22/2010   normal bruce myocadial perfusion study, 72% EF; Dr. Allyson Sabal  . History of mammogram    benign bilat calcifications, heterogeneously dense breasts, stable mammograms 2009-2014  . 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 De Blanch gyencology oncology [place]; Dr. Sylvester Harder gynecology  . Hypertension   . Lactose intolerance   . 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 Alanda Amass  . Osteoporosis   . PONV (postoperative nausea and vomiting)   . SVT  (supraventricular tachycardia) (HCC)    asymptomatic 2014, hx/o SVT, Dr. Alanda Amass  . Vitamin D deficiency     Past Surgical History:  Procedure Laterality Date  . ABDOMINAL HYSTERECTOMY Bilateral    h/o uterine cancer; oophrectomy  . CATARACT EXTRACTION     bilat  . COLONOSCOPY  08/2016   08/2016  Dr. Elnoria Howard advised no repeat colonoscopy;  scattered diverticla, medium hemorrhoids 2010; Dr. Elnoria Howard  . JOINT REPLACEMENT    . NM MYOCAR PERF WALL MOTION    . TONSILLECTOMY Right 12/28/2019   Procedure: TONSILLECTOMY;  Surgeon: Christia Reading, MD;  Location: [facility];  Service: ENT;  Laterality: Right;  . TOTAL HIP ARTHROPLASTY Bilateral 2008, 2010    twice left (complication on the left, once on the right; Dr. Dorene Grebe  . TRANSTHORACIC ECHOCARDIOGRAM  02/2013   ordered for SVT; EF 55-65%; calcified MV annulus, mod MR; RSVP increased; RA mildly dilated; mod TR; PA peak pressure    There were no vitals filed for this visit.  Subjective Assessment - 03/07/20 0806    Subjective  Pt tried some new things over the weekend.    Currently in Pain?  No/denies            ADULT SLP TREATMENT - 03/07/20 0806      General Information   Behavior/Cognition  Alert;Cooperative;Pleasant mood      Treatment  Provided   Treatment provided  Dysphagia      Dysphagia Treatment   Temperature Spikes Noted  No    Respiratory Status  Room air    Oral Cavity - Dentition  Adequate natural dentition    Treatment Methods  Skilled observation;Therapeutic exercise;Compensation strategy training;Patient/caregiver education    Patient observed directly with PO's  Yes    Type of PO's observed  Dysphagia 3 (soft);Thin liquids    Feeding  Able to feed self    Liquids provided via  --   bottle   Pharyngeal Phase Signs & Symptoms  Immediate throat clear;Multiple swallows;Complaints of residue    Type of cueing  Verbal    Amount of cueing  Minimal   occasional faded to rare   Other  treatment/comments  Pt brought nutri-grain bar this morning, along with pureed pears, and water. With all boluses, pt experienced consistent throat clearing. When SLP cued pt to perform at least two HARD swallows per bite or sip, frequency of throat clears decreased but was not eliminated. SLP ensured pt knew to incorporate double HARD swallow with each bite and sip adn was independent with this and her other strategies by end of session. Pt req'd cuing for keeping head in neutral position for swallowing liquids as she consistently tilted head back. Pt stated, "It does feel better when I do it like that." SLP provided pt overt s/sx aspiration PNA and explalined them to her. She req'd rare min A for HEP, fatigue noted with effortful swallows after 4th rep.      Assessment / Recommendations / Plan   Plan  Continue with current plan of care      Dysphagia Recommendations   Diet recommendations  Dysphagia 2 (fine chop);Thin liquid    Liquids provided via  --   bottle   Medication Administration  Crushed with puree    Supervision  Patient able to self feed    Compensations  Slow rate;Small sips/bites;Follow solids with liquid;Clear throat intermittently;Effortful swallow;Multiple dry swallows after each bite/sip    Postural Changes and/or Swallow Maneuvers  Head turn right during swallow;Chin tuck;Upright 30-60 min after meal      Progression Toward Goals   Progression toward goals  Progressing toward goals       SLP Education - 03/07/20 0919    Education Details  HEP, aspiration precautions (added double HARD swallow), keep head in neutral postition with liquids before right/chin down and don't tilt head up, aspiration PNA s/sx    Person(s) Educated  Patient    Methods  Explanation;Demonstration;Verbal cues;Handout    Comprehension  Verbalized understanding;Returned demonstration;Verbal cues required;Need further instruction       SLP Short Term Goals - 03/07/20 0925      SLP SHORT TERM GOAL  #1   Title  Pt will complete HEP for dysphagia with occasional min A over 2 sessions    Baseline  03-07-20    Time  3    Period  Weeks    Status  On-going      SLP SHORT TERM GOAL #2   Title  Pt will follow swallow precautions with rare min A over 2 sessions    Time  3    Period  Weeks    Status  On-going      SLP SHORT TERM GOAL #3   Title  Pt/family witll verbalize s/s of aspiration pna with rare min A    Status  Achieved       SLP  Long Term Goals - 03/07/20 0926      SLP LONG TERM GOAL #1   Title  Pt will complete HEP for dysphagia with rare min A over 2 sessions    Time  7    Period  Weeks    Status  On-going      SLP LONG TERM GOAL #2   Title  Pt will follow swallow precautions with mod I over 2 sessions    Time  7    Period  Weeks    Status  On-going      SLP LONG TERM GOAL #3   Title  Pt will tolerate Dysphagia 2 (fine chop) diet with no over s/s of aspiration.    Time  7    Period  Weeks    Status  On-going       Plan - 03/07/20 6213    Clinical Impression Statement  [name] is referred for outpt swallowing therapy s/p FEES 02/17/20 and surgery for tonsil cancer. See details in HPI section. This weekend pt ate some other dys II, and rare dys III items, and thin liquids. Pt without mister today for hydration, drank via bottle. SLP told pt to double swallow HARD with all POs - added this to her aspiration precautions. Overt s/sx aspiraiton PNA provided today. Recommend skilled ST to maximize carryover of swallow precautions and safety of swallow    Speech Therapy Frequency  2x / week    Duration  --   8 weeks or 17 visits   Treatment/Interventions  Aspiration precaution training;Pharyngeal strengthening exercises;Diet toleration management by SLP;Trials of upgraded texture/liquids;Environmental controls;Compensatory techniques;Other (comment);Oral motor exercises;Internal/external aids;Patient/family education    Potential to Achieve Goals  Good    SLP Home  Exercise Plan  Effortful swallow, lingual sweep    Consulted and Agree with Plan of Care  Patient;Family member/caregiver   daughter in [employer] optician      Patient will benefit from skilled therapeutic intervention in order to improve the following deficits and impairments:   Dysphagia, oropharyngeal phase    Problem List Patient Active Problem List   Diagnosis Date Noted  . Abnormal chest x-ray 08/12/2019  . Encounter for health maintenance examination in adult 08/12/2019  . Need for influenza vaccination 08/12/2019  . Chest wall pain 06/11/2019  . Muscle strain 06/11/2019  . Abnormal EKG 06/11/2019  . Murmur 09/05/2018  . Aortic atherosclerosis (HCC) 08/04/2018  . Elevated ferritin 08/04/2018  . Other fatigue 07/14/2018  . Iron disorder 07/14/2018  . Glaucoma 07/11/2017  . Vaccine counseling 07/11/2017  . Varicose vein of leg 07/11/2017  . Need for shingles vaccine 07/11/2017  . Hearing loss 06/27/2016  . Medicare annual wellness visit, subsequent 06/26/2016  . Osteoporosis 01/12/2016  . Dyslipidemia 06/14/2015  . Essential hypertension 06/14/2015  . Mitral valve regurgitation 06/14/2015  . Tricuspid valve insufficiency 06/14/2015  . Vitamin D deficiency 06/14/2015  . Rhinitis, allergic 06/14/2015  . Renal insufficiency 06/14/2015  . 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 Lown Maryla Morrow syndrome 03/23/2014  . AVNRT (AV nodal re-entry tachycardia) (HCC) 03/23/2014    [name] ,MS, CCC-SLP' 03/07/2020, 9:26 AM  [facility] [address] [place], [place], [address] Phone: [phone]   Fax:  [phone]   Name: [name] MRN: [id number] Date of Birth: 24-Sep-1933

## 2020-03-07 NOTE — Patient Instructions (Signed)
**  MAKE SURE YOU ARE SWALLOWING HARD, TWICE, WITH EVERYTHING YOU EAT OR DRINK**     Signs of Aspiration Pneumonia   . Chest pain/tightness . Fever (can be low grade) . Cough  o With foul-smelling phlegm (sputum) o With sputum containing pus or blood o With greenish sputum . Fatigue  . Shortness of breath  . Wheezing   **IF YOU HAVE THESE SIGNS, CONTACT YOUR DOCTOR OR GO TO THE EMERGENCY DEPARTMENT OR URGENT CARE AS SOON AS POSSIBLE**

## 2020-03-09 ENCOUNTER — Ambulatory Visit: Payer: Medicare PPO | Admitting: Speech Pathology

## 2020-03-09 ENCOUNTER — Other Ambulatory Visit: Payer: Self-pay

## 2020-03-09 ENCOUNTER — Encounter: Payer: Self-pay | Admitting: Speech Pathology

## 2020-03-09 DIAGNOSIS — R1312 Dysphagia, oropharyngeal phase: Secondary | ICD-10-CM

## 2020-03-09 NOTE — Therapy (Signed)
Status  On-going      SLP LONG TERM GOAL #3   Title  Pt will tolerate Dysphagia 2 (fine chop) diet with no over s/s of aspiration.    Time  7    Period  Weeks    Status  On-going       Plan - 03/09/20 1521    Clinical Impression Statement  [name] is referred for outpt swallowing therapy s/p FEES 02/17/20 and surgery for tonsil cancer. See details in HPI section. This weekend pt ate some other dys II, and rare dys III items, and thin liquids. Pt without mister today for hydration, drank via bottle. SLP told pt to double swallow HARD with all POs - added this to her aspiration precautions. Overt s/sx aspiraiton PNA provided today. Recommend skilled ST to maximize carryover of swallow precautions and safety of swallow. She follows up with Dr. Conley Canal Monday and believes she may have repeat FEES at that time. Improvement noted, increasing textures slowly.    Speech Therapy Frequency  2x / week    Duration  --   8 weeks or 17 visits   Treatment/Interventions  Aspiration precaution training;Pharyngeal strengthening exercises;Diet toleration management by SLP;Trials of upgraded texture/liquids;Environmental controls;Compensatory techniques;Other (comment);Oral motor exercises;Internal/external aids;Patient/family education       Patient will benefit from skilled therapeutic intervention in order to improve the following deficits and impairments:   Dysphagia, oropharyngeal phase    Problem List Patient Active Problem List   Diagnosis Date Noted  . Abnormal chest x-ray 08/12/2019  . Encounter for health maintenance examination in adult 08/12/2019  . Need for influenza vaccination 08/12/2019  . Chest wall pain 06/11/2019  . Muscle strain  06/11/2019  . Abnormal EKG 06/11/2019  . Murmur 09/05/2018  . Aortic atherosclerosis ([place]) 08/04/2018  . Elevated ferritin 08/04/2018  . Other fatigue 07/14/2018  . Iron disorder 07/14/2018  . Glaucoma 07/11/2017  . Vaccine counseling 07/11/2017  . Varicose vein of leg 07/11/2017  . Need for shingles vaccine 07/11/2017  . Hearing loss 06/27/2016  . Medicare annual wellness visit, subsequent 06/26/2016  . Osteoporosis 01/12/2016  . Dyslipidemia 06/14/2015  . Essential hypertension 06/14/2015  . Mitral valve regurgitation 06/14/2015  . Tricuspid valve insufficiency 06/14/2015  . Vitamin D deficiency 06/14/2015  . Rhinitis, allergic 06/14/2015  . Renal insufficiency 06/14/2015  . 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 AVNRT (AV nodal re-entry tachycardia) ([place]) 03/23/2014    Ell Tiso, Annye Rusk MS, CCC-SLP 03/09/2020, 3:24 PM  [facility] [address] [place], [place], [address] Phone: [phone]   Fax:  [phone]   Name: [name] MRN: [id number] Date of Birth: [birth year]  [facility] [address] [place], [place], [address] Phone: [phone]   Fax:  [phone]  Speech Language Pathology Treatment  Patient Details  Name: [name] MRN: [id number] Date of Birth: [birth year] Referring Provider (SLP): Dr. Fredricka Bonine   Encounter Date: 03/09/2020  End of Session - 03/09/20 1523    Visit Number  5    Number of Visits  17    Date for SLP Re-Evaluation  04/25/20    SLP Start Time  1325    SLP Stop Time   1400    SLP Time Calculation (min)  35 min    Activity Tolerance  Patient tolerated treatment well       Past Medical History:  Diagnosis Date  . Allergies   . Allergy   . Anxiety   . Arthritis   . Atrophic vaginitis   . Cataract    hx/o surgery, both eyes; Dr. Gershon Crane  . Complication of anesthesia   . Diverticulosis    per 2010 colonoscopy  . Dyslipidemia   . Full dentures   . GERD (gastroesophageal reflux disease)    hx/o, resolved  . Glaucoma 2016  . H/O echocardiogram 06/22/2010   normal LVEF, moderate mitral and tricuspid regurg 07/2016 [name];  mild aortic valve stenosis, left ventricular normal, EF 55%; Dr. Gwenlyn Found  06/2010  . History of cardiovascular stress test 06/22/2010   normal bruce myocadial perfusion study, 72% EF; Dr. Gwenlyn Found  . History of mammogram    benign bilat calcifications, heterogeneously dense breasts, stable mammograms 2009-2014  . 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 Marti Sleigh gyencology oncology [place]; Dr. Lorriane Shire gynecology  . Hypertension   . Lactose intolerance   . 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 Rollene Fare  . Osteoporosis   . PONV (postoperative nausea and vomiting)   . SVT  (supraventricular tachycardia) ([place])    asymptomatic 2014, hx/o SVT, Dr. Rollene Fare  . Vitamin D deficiency     Past Surgical History:  Procedure Laterality Date  . ABDOMINAL HYSTERECTOMY Bilateral    h/o uterine cancer; oophrectomy  . CATARACT EXTRACTION     bilat  . COLONOSCOPY  08/2016   08/2016  Dr. Benson Norway advised no repeat colonoscopy;  scattered diverticla, medium hemorrhoids 2010; Dr. Benson Norway  . JOINT REPLACEMENT    . NM MYOCAR PERF WALL MOTION    . TONSILLECTOMY Right 12/28/2019   Procedure: TONSILLECTOMY;  Surgeon: Melida Quitter, MD;  Location: [facility];  Service: ENT;  Laterality: Right;  . TOTAL HIP ARTHROPLASTY Bilateral 2008, 2010    twice left (complication on the left, once on the right; Dr. Alphonzo Severance  . TRANSTHORACIC ECHOCARDIOGRAM  02/2013   ordered for SVT; EF 55-65%; calcified MV annulus, mod MR; RSVP increased; RA mildly dilated; mod TR; PA peak pressure 6mmHg    There were no vitals filed for this visit.  Subjective Assessment - 03/09/20 1516    Subjective  "I tell my husband not to interrupt me or ask for anything when I'm eating"    Currently in Pain?  No/denies            ADULT SLP TREATMENT - 03/09/20 1516      General Information   Behavior/Cognition  Alert;Cooperative;Pleasant  Status  On-going      SLP LONG TERM GOAL #3   Title  Pt will tolerate Dysphagia 2 (fine chop) diet with no over s/s of aspiration.    Time  7    Period  Weeks    Status  On-going       Plan - 03/09/20 1521    Clinical Impression Statement  [name] is referred for outpt swallowing therapy s/p FEES 02/17/20 and surgery for tonsil cancer. See details in HPI section. This weekend pt ate some other dys II, and rare dys III items, and thin liquids. Pt without mister today for hydration, drank via bottle. SLP told pt to double swallow HARD with all POs - added this to her aspiration precautions. Overt s/sx aspiraiton PNA provided today. Recommend skilled ST to maximize carryover of swallow precautions and safety of swallow. She follows up with Dr. Conley Canal Monday and believes she may have repeat FEES at that time. Improvement noted, increasing textures slowly.    Speech Therapy Frequency  2x / week    Duration  --   8 weeks or 17 visits   Treatment/Interventions  Aspiration precaution training;Pharyngeal strengthening exercises;Diet toleration management by SLP;Trials of upgraded texture/liquids;Environmental controls;Compensatory techniques;Other (comment);Oral motor exercises;Internal/external aids;Patient/family education       Patient will benefit from skilled therapeutic intervention in order to improve the following deficits and impairments:   Dysphagia, oropharyngeal phase    Problem List Patient Active Problem List   Diagnosis Date Noted  . Abnormal chest x-ray 08/12/2019  . Encounter for health maintenance examination in adult 08/12/2019  . Need for influenza vaccination 08/12/2019  . Chest wall pain 06/11/2019  . Muscle strain  06/11/2019  . Abnormal EKG 06/11/2019  . Murmur 09/05/2018  . Aortic atherosclerosis ([place]) 08/04/2018  . Elevated ferritin 08/04/2018  . Other fatigue 07/14/2018  . Iron disorder 07/14/2018  . Glaucoma 07/11/2017  . Vaccine counseling 07/11/2017  . Varicose vein of leg 07/11/2017  . Need for shingles vaccine 07/11/2017  . Hearing loss 06/27/2016  . Medicare annual wellness visit, subsequent 06/26/2016  . Osteoporosis 01/12/2016  . Dyslipidemia 06/14/2015  . Essential hypertension 06/14/2015  . Mitral valve regurgitation 06/14/2015  . Tricuspid valve insufficiency 06/14/2015  . Vitamin D deficiency 06/14/2015  . Rhinitis, allergic 06/14/2015  . Renal insufficiency 06/14/2015  . 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 AVNRT (AV nodal re-entry tachycardia) ([place]) 03/23/2014    Ell Tiso, Annye Rusk MS, CCC-SLP 03/09/2020, 3:24 PM  [facility] [address] [place], [place], [address] Phone: [phone]   Fax:  [phone]   Name: [name] MRN: [id number] Date of Birth: [birth year]

## 2020-03-09 NOTE — Patient Instructions (Signed)
   Keep up the good work taking small bites, turning chin right with chin down and alternating solids then liquids - just make sure you swallow the solid before taking the liquid sip  Try taking small bites of cereal bar, rolled up chicken, 2 slices of chicken on a piece of bread, tuna or salmon  Be careful with noodle soup that you turn your head when you swallow both the liquid and noodles - don't let the liquid go down with your head up  Make sure you do your head turn and chin down with meds  Write down any questions you have your Dr. Conley Canal before your visit  Keep doing your exercises but the hard swallow is the most important if you are pressed for time  Ask about a swallow test to see if you still need to do your chin right and down  Just make sure you chew everything to a puree consistency

## 2020-03-15 ENCOUNTER — Ambulatory Visit: Payer: Medicare PPO

## 2020-03-15 ENCOUNTER — Other Ambulatory Visit: Payer: Self-pay

## 2020-03-15 DIAGNOSIS — R1312 Dysphagia, oropharyngeal phase: Secondary | ICD-10-CM

## 2020-03-15 NOTE — Therapy (Signed)
[facility] [address] [place], [place], [address] Phone: [phone]   Fax:  [phone]  Speech Language Pathology Treatment  Patient Details  Name: [name] MRN: [id number] Date of Birth: 1933/11/05 Referring Provider (SLP): Dr. Fredricka Bonine   Encounter Date: 03/15/2020  End of Session - 03/15/20 1516    Visit Number  6    Number of Visits  17    Date for SLP Re-Evaluation  04/25/20    SLP Start Time  0850    SLP Stop Time   0925    SLP Time Calculation (min)  35 min    Activity Tolerance  Patient tolerated treatment well       Past Medical History:  Diagnosis Date  . Allergies   . Allergy   . Anxiety   . Arthritis   . Atrophic vaginitis   . Cataract    hx/o surgery, both eyes; Dr. Gershon Crane  . Complication of anesthesia   . Diverticulosis    per 2010 colonoscopy  . Dyslipidemia   . Full dentures   . GERD (gastroesophageal reflux disease)    hx/o, resolved  . Glaucoma 2016  . H/O echocardiogram 06/22/2010   normal LVEF, moderate mitral and tricuspid regurg 07/2016 [name];  mild aortic valve stenosis, left ventricular normal, EF 55%; Dr. Gwenlyn Found  06/2010  . History of cardiovascular stress test 06/22/2010   normal bruce myocadial perfusion study, 72% EF; Dr. Gwenlyn Found  . History of mammogram    benign bilat calcifications, heterogeneously dense breasts, stable mammograms 2009-2014  . 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 Marti Sleigh gyencology oncology [place]; Dr. Lorriane Shire gynecology  . Hypertension   . Lactose intolerance   . 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 Rollene Fare  . Osteoporosis   . PONV (postoperative nausea and vomiting)   . SVT  (supraventricular tachycardia) ([place])    asymptomatic 2014, hx/o SVT, Dr. Rollene Fare  . Vitamin D deficiency     Past Surgical History:  Procedure Laterality Date  . ABDOMINAL HYSTERECTOMY Bilateral    h/o uterine cancer; oophrectomy  . CATARACT EXTRACTION     bilat  . COLONOSCOPY  08/2016   08/2016  Dr. Benson Norway advised no repeat colonoscopy;  scattered diverticla, medium hemorrhoids 2010; Dr. Benson Norway  . JOINT REPLACEMENT    . NM MYOCAR PERF WALL MOTION    . TONSILLECTOMY Right 12/28/2019   Procedure: TONSILLECTOMY;  Surgeon: Melida Quitter, MD;  Location: [facility];  Service: ENT;  Laterality: Right;  . TOTAL HIP ARTHROPLASTY Bilateral 2008, 2010    twice left (complication on the left, once on the right; Dr. Alphonzo Severance  . TRANSTHORACIC ECHOCARDIOGRAM  02/2013   ordered for SVT; EF 55-65%; calcified MV annulus, mod MR; RSVP increased; RA mildly dilated; mod TR; PA peak pressure 5mmHg    There were no vitals filed for this visit.  Subjective Assessment - 03/15/20 0859    Subjective  I saw the assistant (SLP) yesterday and she said I did better.    Currently in Pain?  No/denies            ADULT SLP TREATMENT - 03/15/20 0001      General Information   Behavior/Cognition  Alert;Cooperative;Pleasant mood  Treatment Provided   Treatment provided  Dysphagia      Dysphagia Treatment   Temperature Spikes Noted  No    Respiratory Status  Room air    Oral Cavity - Dentition  Adequate natural dentition    Treatment Methods  Skilled observation;Compensation strategy training;Patient/caregiver education    Patient observed directly with PO's  Yes    Type of PO's observed  Dysphagia 3 (soft);Thin liquids    Feeding  Able to feed self    Liquids provided via  Cup    Oral Phase Signs & Symptoms  Prolonged mastication    Pharyngeal Phase Signs & Symptoms  Audible swallow;Multiple swallows;Immediate throat clear   throat clear x1/7 boluses, but clearing prior to POs too    Other treatment/comments  Pt and SLP discussed results of pt's FEES yesterday. Pt expressed satisfaction at the improvement. SLP reviewed the swallow precautions given her yesterday: head turn rt and slight tuck chin with solids, alternate bite/sip (follow every bite with a sip), double swallow). She demo'd these with rare min A faded to independence. HEP was completed with independence.       Assessment / Recommendations / Plan   Plan  Goals updated     Dysphagia Recommendations   Diet recommendations  Dysphagia 3 (mechanical soft);Thin liquid    Liquids provided via  Cup    Medication Administration  --   whole meds one at a time, or crushed if possilble   Supervision  Patient able to self feed    Compensations  Slow rate;Follow solids with liquid;Multiple dry swallows after each bite/sip   fully swallow bites/sips   Postural Changes and/or Swallow Maneuvers  Head turn right during swallow   and slight chin tuck, with solids     Progression Toward Goals   Progression toward goals  Progressing toward goals       SLP Education - 03/15/20 1515    Education Details  swllow precaustions from FEES 03-14-20, diet recommendtions    Person(s) Educated  Patient    Methods  Explanation;Handout;Verbal cues    Comprehension  Verbalized understanding;Returned demonstration;Verbal cues required       SLP Short Term Goals - 03/15/20 1519      SLP SHORT TERM GOAL #1   Title  Pt will complete HEP for dysphagia with occasional min A over 2 sessions    Baseline  03-07-20    Status  Achieved      SLP SHORT TERM GOAL #2   Title  Pt will follow swallow precautions with rare min A over 2 sessions    Baseline  03-15-20    Time  2    Period  Weeks    Status  On-going      SLP SHORT TERM GOAL #3   Title  Pt/family witll verbalize s/s of aspiration pna with rare min A    Status  Achieved       SLP Long Term Goals - 03/15/20 1519      SLP LONG TERM GOAL #1   Title  Pt will complete HEP for  dysphagia with rare min A over 4 sessions    Baseline  03-15-20    Time  6    Period  Weeks    Status  Revised      SLP LONG TERM GOAL #2   Title  Pt will follow swallow precautions with mod I over 4 sessions    Baseline  03-15-20    Time  6  Period  Weeks    Status  Revised      SLP LONG TERM GOAL #3   Title  Pt will tolerate Dysphagia 3 diet with no overt s/s of aspiration.    Time  6    Status  Revised       Plan - 03/15/20 1516    Clinical Impression Statement  Mrs. Intriago is referred for outpt swallowing therapy s/p FEES 02/17/20 and surgery for tonsil cancer. She had follow up FEES yesterday and improvement was shown. Dys III diet with thin liquids recommeded. See those results from [facility] note from yesterday. Recommend cont'd skilled ST to maximize carryover of swallow precautions and safety of swallow. She follows up with Dr. Conley Canal Monday and believes she may have repeat FEES at that time. Improvement noted, increasing textures slowly.    Speech Therapy Frequency  2x / week    Duration  --   8 weeks or 17 visits   Treatment/Interventions  Aspiration precaution training;Pharyngeal strengthening exercises;Diet toleration management by SLP;Trials of upgraded texture/liquids;Environmental controls;Compensatory techniques;Other (comment);Oral motor exercises;Internal/external aids;Patient/family education       Patient will benefit from skilled therapeutic intervention in order to improve the following deficits and impairments:   Dysphagia, oropharyngeal phase    Problem List Patient Active Problem List   Diagnosis Date Noted  . Abnormal chest x-ray 08/12/2019  . Encounter for health maintenance examination in adult 08/12/2019  . Need for influenza vaccination 08/12/2019  . Chest wall pain 06/11/2019  . Muscle strain 06/11/2019  . Abnormal EKG 06/11/2019  . Murmur 09/05/2018  . Aortic atherosclerosis ([place]) 08/04/2018  . Elevated ferritin 08/04/2018  .  Other fatigue 07/14/2018  . Iron disorder 07/14/2018  . Glaucoma 07/11/2017  . Vaccine counseling 07/11/2017  . Varicose vein of leg 07/11/2017  . Need for shingles vaccine 07/11/2017  . Hearing loss 06/27/2016  . Medicare annual wellness visit, subsequent 06/26/2016  . Osteoporosis 01/12/2016  . Dyslipidemia 06/14/2015  . Essential hypertension 06/14/2015  . Mitral valve regurgitation 06/14/2015  . Tricuspid valve insufficiency 06/14/2015  . Vitamin D deficiency 06/14/2015  . Rhinitis, allergic 06/14/2015  . Renal insufficiency 06/14/2015  . 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 AVNRT (AV nodal re-entry tachycardia) ([place] [facility]) 03/23/2014    [place] ,[place], [place]  03/15/2020, 3:22 PM  [facility] [address] [place], [place], [address] Phone: [phone]   Fax:  [phone]   Name: KYMIA PEA MRN: [id number] Date of Birth: [birth year]

## 2020-03-15 NOTE — Patient Instructions (Addendum)
When you eat:  Turn your head to the right and tuck chin just *a bit* with solids  Take a sip after each bite of food  Double swallow for all bites or sips  As we talked about, FULLY swallow before adding to your mouth (do not mix food and liquid in one bite)

## 2020-03-16 ENCOUNTER — Ambulatory Visit: Payer: Medicare PPO

## 2020-03-16 DIAGNOSIS — R1312 Dysphagia, oropharyngeal phase: Secondary | ICD-10-CM

## 2020-03-16 NOTE — Therapy (Signed)
[facility] [address] [place], [place], [address] Phone: [phone]   Fax:  [phone]  Speech Language Pathology Treatment  Patient Details  Name: [name] MRN: [id number] Date of Birth: [birth year] Referring Provider (SLP): Dr. Fredricka Bonine   Encounter Date: 03/16/2020  End of Session - 03/16/20 1402    Visit Number  7    Number of Visits  17    Date for SLP Re-Evaluation  04/25/20    SLP Start Time  1316    SLP Stop Time   1350    SLP Time Calculation (min)  34 min    Activity Tolerance  Patient tolerated treatment well       Past Medical History:  Diagnosis Date  . Allergies   . Allergy   . Anxiety   . Arthritis   . Atrophic vaginitis   . Cataract    hx/o surgery, both eyes; Dr. Gershon Crane  . Complication of anesthesia   . Diverticulosis    per 2010 colonoscopy  . Dyslipidemia   . Full dentures   . GERD (gastroesophageal reflux disease)    hx/o, resolved  . Glaucoma 2016  . H/O echocardiogram 06/22/2010   normal LVEF, moderate mitral and tricuspid regurg 07/2016 [name];  mild aortic valve stenosis, left ventricular normal, EF 55%; Dr. Gwenlyn Found  06/2010  . History of cardiovascular stress test 06/22/2010   normal bruce myocadial perfusion study, 72% EF; Dr. Gwenlyn Found  . History of mammogram    benign bilat calcifications, heterogeneously dense breasts, stable mammograms 2009-2014  . 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 Marti Sleigh gyencology oncology [place]; Dr. Lorriane Shire gynecology  . Hypertension   . Lactose intolerance   . 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 Rollene Fare  . Osteoporosis   . PONV (postoperative nausea and vomiting)   . SVT  (supraventricular tachycardia) ([place])    asymptomatic 2014, hx/o SVT, Dr. Rollene Fare  . Vitamin D deficiency     Past Surgical History:  Procedure Laterality Date  . ABDOMINAL HYSTERECTOMY Bilateral    h/o uterine cancer; oophrectomy  . CATARACT EXTRACTION     bilat  . COLONOSCOPY  08/2016   08/2016  Dr. Benson Norway advised no repeat colonoscopy;  scattered diverticla, medium hemorrhoids 2010; Dr. Benson Norway  . JOINT REPLACEMENT    . NM MYOCAR PERF WALL MOTION    . TONSILLECTOMY Right 12/28/2019   Procedure: TONSILLECTOMY;  Surgeon: Melida Quitter, MD;  Location: [facility];  Service: ENT;  Laterality: Right;  . TOTAL HIP ARTHROPLASTY Bilateral 2008, 2010    twice left (complication on the left, once on the right; Dr. Alphonzo Severance  . TRANSTHORACIC ECHOCARDIOGRAM  02/2013   ordered for SVT; EF 55-65%; calcified MV annulus, mod MR; RSVP increased; RA mildly dilated; mod TR; PA peak pressure 68mmHg    There were no vitals filed for this visit.  Subjective Assessment - 03/16/20 1318    Subjective  "I've been practicing the things to do."    Currently in Pain?  No/denies            ADULT SLP TREATMENT - 03/16/20 1320      General Information   Behavior/Cognition  Alert;Cooperative;Pleasant mood      Treatment Provided  Treatment provided  Dysphagia      Dysphagia Treatment   Temperature Spikes Noted  No    Respiratory Status  Room air    Oral Cavity - Dentition  Adequate natural dentition    Treatment Methods  Skilled observation;Therapeutic exercise;Compensation strategy training;Patient/caregiver education    Patient observed directly with PO's  Yes    Type of PO's observed  Dysphagia 1 (puree);Dysphagia 3 (soft);Thin liquids    Feeding  Able to feed self    Liquids provided via  --   bottle   Oral Phase Signs & Symptoms  Prolonged mastication    Pharyngeal Phase Signs & Symptoms  Audible swallow    Other treatment/comments  Pt used her list of precautions from  yesterday provided her by SLP for POs. She was independent with precautions, as she didn't look at handout of precautions. She performed HEP with SBA, with initial cues for maintaining tongue protrusion with "ga-ga-ga." Pt confirmed number of reps for each exercise with SLP and SLP told pt to do at least the recommended number of reps - pt stated she often does more than the req'd amount.       Assessment / Recommendations / Plan   Plan  Continue with current plan of care      Dysphagia Recommendations   Diet recommendations  Dysphagia 3 (mechanical soft)    Liquids provided via  Cup   or bottle   Medication Administration  Whole meds with liquid   or crushed if able   Supervision  Patient able to self feed    Compensations  Slow rate;Follow solids with liquid;Multiple dry swallows after each bite/sip    Postural Changes and/or Swallow Maneuvers  Head turn right during swallow      Progression Toward Goals   Progression toward goals  Progressing toward goals       SLP Education - 03/16/20 1338    Education Details  Cont to do HEP with prescribed reps/frequency    Person(s) Educated  Patient    Methods  Explanation    Comprehension  Verbalized understanding       SLP Short Term Goals - 03/16/20 1405      SLP SHORT TERM GOAL #1   Title  Pt will complete HEP for dysphagia with occasional min A over 2 sessions    Baseline  03-07-20    Status  Achieved      SLP SHORT TERM GOAL #2   Title  Pt will follow swallow precautions with rare min A over 2 sessions    Baseline  03-15-20, 03-16-20    Status  Achieved      SLP SHORT TERM GOAL #3   Title  Pt/family witll verbalize s/s of aspiration pna with rare min A    Status  Achieved       SLP Long Term Goals - 03/16/20 1405      SLP LONG TERM GOAL #1   Title  Pt will complete HEP for dysphagia with rare min A over 4 sessions    Baseline  03-15-20, 03-16-20    Time  6    Period  Weeks    Status  Revised      SLP LONG TERM GOAL #2    Title  Pt will follow swallow precautions with mod I over 4 sessions    Baseline  03-15-20, 03-16-20    Time  6    Period  Weeks    Status  Revised  SLP LONG TERM GOAL #3   Title  Pt will tolerate Dysphagia 3 diet with no overt s/s of aspiration x3 sessions    Baseline  03-16-20    Time  6    Status  Revised       Plan - 03/16/20 1403    Clinical Impression Statement  Mrs. Febo is referred for outpt swallowing therapy s/p FEES 02/17/20 and surgery for tonsil cancer. Pt followed precautions independently with dys III, applesauce, and thin liqiuds today. HEP completed with SBA. Recommend cont'd skilled ST to maximize carryover of swallow precautions and safety of swallow. She follows up with Dr. Conley Canal Monday and believes she may have repeat FEES at that time. Improvement noted, increasing textures slowly. Pt possibly decr to once/week depending on progress next week.    Speech Therapy Frequency  2x / week    Duration  --   8 weeks or 17 visits   Treatment/Interventions  Aspiration precaution training;Pharyngeal strengthening exercises;Diet toleration management by SLP;Trials of upgraded texture/liquids;Environmental controls;Compensatory techniques;Other (comment);Oral motor exercises;Internal/external aids;Patient/family education       Patient will benefit from skilled therapeutic intervention in order to improve the following deficits and impairments:   Dysphagia, oropharyngeal phase    Problem List Patient Active Problem List   Diagnosis Date Noted  . Abnormal chest x-ray 08/12/2019  . Encounter for health maintenance examination in adult 08/12/2019  . Need for influenza vaccination 08/12/2019  . Chest wall pain 06/11/2019  . Muscle strain 06/11/2019  . Abnormal EKG 06/11/2019  . Murmur 09/05/2018  . Aortic atherosclerosis ([place]) 08/04/2018  . Elevated ferritin 08/04/2018  . Other fatigue 07/14/2018  . Iron disorder 07/14/2018  . Glaucoma 07/11/2017  . Vaccine  counseling 07/11/2017  . Varicose vein of leg 07/11/2017  . Need for shingles vaccine 07/11/2017  . Hearing loss 06/27/2016  . Medicare annual wellness visit, subsequent 06/26/2016  . Osteoporosis 01/12/2016  . Dyslipidemia 06/14/2015  . Essential hypertension 06/14/2015  . Mitral valve regurgitation 06/14/2015  . Tricuspid valve insufficiency 06/14/2015  . Vitamin D deficiency 06/14/2015  . Rhinitis, allergic 06/14/2015  . Renal insufficiency 06/14/2015  . 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 AVNRT (AV nodal re-entry tachycardia) ([place]) 03/23/2014    [place] ,[place], [place]  03/16/2020, 2:06 PM  [facility] [address] [place], [place], [address] Phone: [phone]   Fax:  [phone]   Name: MELINE SWIDER MRN: [id number] Date of Birth: [birth year]

## 2020-03-23 ENCOUNTER — Ambulatory Visit: Payer: Medicare PPO | Attending: Otolaryngology | Admitting: Speech Pathology

## 2020-03-23 ENCOUNTER — Encounter: Payer: Self-pay | Admitting: Speech Pathology

## 2020-03-23 ENCOUNTER — Other Ambulatory Visit: Payer: Self-pay

## 2020-03-23 DIAGNOSIS — R1312 Dysphagia, oropharyngeal phase: Secondary | ICD-10-CM

## 2020-03-23 NOTE — Therapy (Signed)
Weeks    Status  On-going      SLP LONG TERM GOAL #3   Title  Pt will tolerate Dysphagia 3 diet with no overt s/s of aspiration x3 sessions    Baseline  03-16-20, 03-23-20    Time  5    Status  On-going       Plan - 03/23/20 1221    Clinical Impression Statement  Repeat FEES 03/14/20 revealed improved swallow with mild to moderate pharyngeal residue.which was reduced with head turn right and chin tuck, and liquid wash. Pt can advance to Dys 3 and thin liquids with precautions. Recommend to repeat FEES upon completion of this course of ST. Pt continues to be mod I with swallow precautions and HEP, decrease to 1x a week to ensure caryover of precautions and diet advancement.    Speech Therapy Frequency  1x /week    Duration  --   8 weeks or 17 visits   Treatment/Interventions  Aspiration precaution training;Pharyngeal strengthening exercises;Diet toleration management by SLP;Trials of upgraded texture/liquids;Environmental controls;Compensatory techniques;Other (comment);Oral motor exercises;Internal/external aids;Patient/family education       Patient will benefit from skilled therapeutic intervention in order to improve the following deficits and impairments:   Dysphagia, oropharyngeal phase    Problem List Patient Active Problem List   Diagnosis Date Noted  . Abnormal chest x-ray 08/12/2019  . Encounter for health maintenance examination in adult 08/12/2019  . Need for influenza vaccination 08/12/2019  . Chest wall pain 06/11/2019  . Muscle strain 06/11/2019  . Abnormal EKG 06/11/2019  . Murmur 09/05/2018  . Aortic atherosclerosis ([place]) 08/04/2018  . Elevated ferritin 08/04/2018  . Other fatigue 07/14/2018  . Iron disorder 07/14/2018  . Glaucoma 07/11/2017  . Vaccine counseling 07/11/2017  . Varicose vein of  leg 07/11/2017  . Need for shingles vaccine 07/11/2017  . Hearing loss 06/27/2016  . Medicare annual wellness visit, subsequent 06/26/2016  . Osteoporosis 01/12/2016  . Dyslipidemia 06/14/2015  . Essential hypertension 06/14/2015  . Mitral valve regurgitation 06/14/2015  . Tricuspid valve insufficiency 06/14/2015  . Vitamin D deficiency 06/14/2015  . Rhinitis, allergic 06/14/2015  . Renal insufficiency 06/14/2015  . 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 AVNRT (AV nodal re-entry tachycardia) ([facility]) 03/23/2014    [name], Annye Rusk MS, CCC-SLP 03/23/2020, 12:29 PM  [facility] [address] [place], [place], [address] Phone: [phone]   Fax:  [phone]   Name: [name] MRN: [id number] Date of Birth: 27-Jun-1933  [facility] [address] [place], [place], [address] Phone: [phone]   Fax:  [phone]  Speech Language Pathology Treatment  Patient Details  Name: [name] MRN: [id number] Date of Birth: 09/21/1933 Referring Provider (SLP): Dr. Fredricka Bonine   Encounter Date: 03/23/2020  End of Session - 03/23/20 1229    Visit Number  8    Number of Visits  17    Date for SLP Re-Evaluation  04/25/20    SLP Start Time  1146    SLP Stop Time   1227    SLP Time Calculation (min)  41 min    Activity Tolerance  Patient tolerated treatment well       Past Medical History:  Diagnosis Date  . Allergies   . Allergy   . Anxiety   . Arthritis   . Atrophic vaginitis   . Cataract    hx/o surgery, both eyes; Dr. Gershon Crane  . Complication of anesthesia   . Diverticulosis    per 2010 colonoscopy  . Dyslipidemia   . Full dentures   . GERD (gastroesophageal reflux disease)    hx/o, resolved  . Glaucoma 2016  . H/O echocardiogram 06/22/2010   normal LVEF, moderate mitral and tricuspid regurg 07/2016 [name];  mild aortic valve stenosis, left ventricular normal, EF 55%; Dr. Gwenlyn Found  06/2010  . History of cardiovascular stress test 06/22/2010   normal bruce myocadial perfusion study, 72% EF; Dr. Gwenlyn Found  . History of mammogram    benign bilat calcifications, heterogeneously dense breasts, stable mammograms 2009-2014  . 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 Marti Sleigh gyencology oncology [place]; Dr. Lorriane Shire gynecology  . Hypertension   . Lactose intolerance   . 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 Rollene Fare  . Osteoporosis   . PONV (postoperative nausea and vomiting)   . SVT (supraventricular  tachycardia) ([place])    asymptomatic 2014, hx/o SVT, Dr. Rollene Fare  . Vitamin D deficiency     Past Surgical History:  Procedure Laterality Date  . ABDOMINAL HYSTERECTOMY Bilateral    h/o uterine cancer; oophrectomy  . CATARACT EXTRACTION     bilat  . COLONOSCOPY  08/2016   08/2016  Dr. Benson Norway advised no repeat colonoscopy;  scattered diverticla, medium hemorrhoids 2010; Dr. Benson Norway  . JOINT REPLACEMENT    . NM MYOCAR PERF WALL MOTION    . TONSILLECTOMY Right 12/28/2019   Procedure: TONSILLECTOMY;  Surgeon: Melida Quitter, MD;  Location: [facility];  Service: ENT;  Laterality: Right;  . TOTAL HIP ARTHROPLASTY Bilateral 2008, 2010    twice left (complication on the left, once on the right; Dr. Alphonzo Severance  . TRANSTHORACIC ECHOCARDIOGRAM  02/2013   ordered for SVT; EF 55-65%; calcified MV annulus, mod MR; RSVP increased; RA mildly dilated; mod TR; PA peak pressure 12mmHg    There were no vitals filed for this visit.  Subjective Assessment - 03/23/20 1153    Subjective  "She said I can eat soft meats but still turn my head and put my head down"    Currently in Pain?  No/denies            ADULT SLP TREATMENT - 03/23/20 1154      General Information   Behavior/Cognition  Weeks    Status  On-going      SLP LONG TERM GOAL #3   Title  Pt will tolerate Dysphagia 3 diet with no overt s/s of aspiration x3 sessions    Baseline  03-16-20, 03-23-20    Time  5    Status  On-going       Plan - 03/23/20 1221    Clinical Impression Statement  Repeat FEES 03/14/20 revealed improved swallow with mild to moderate pharyngeal residue.which was reduced with head turn right and chin tuck, and liquid wash. Pt can advance to Dys 3 and thin liquids with precautions. Recommend to repeat FEES upon completion of this course of ST. Pt continues to be mod I with swallow precautions and HEP, decrease to 1x a week to ensure caryover of precautions and diet advancement.    Speech Therapy Frequency  1x /week    Duration  --   8 weeks or 17 visits   Treatment/Interventions  Aspiration precaution training;Pharyngeal strengthening exercises;Diet toleration management by SLP;Trials of upgraded texture/liquids;Environmental controls;Compensatory techniques;Other (comment);Oral motor exercises;Internal/external aids;Patient/family education       Patient will benefit from skilled therapeutic intervention in order to improve the following deficits and impairments:   Dysphagia, oropharyngeal phase    Problem List Patient Active Problem List   Diagnosis Date Noted  . Abnormal chest x-ray 08/12/2019  . Encounter for health maintenance examination in adult 08/12/2019  . Need for influenza vaccination 08/12/2019  . Chest wall pain 06/11/2019  . Muscle strain 06/11/2019  . Abnormal EKG 06/11/2019  . Murmur 09/05/2018  . Aortic atherosclerosis ([place]) 08/04/2018  . Elevated ferritin 08/04/2018  . Other fatigue 07/14/2018  . Iron disorder 07/14/2018  . Glaucoma 07/11/2017  . Vaccine counseling 07/11/2017  . Varicose vein of  leg 07/11/2017  . Need for shingles vaccine 07/11/2017  . Hearing loss 06/27/2016  . Medicare annual wellness visit, subsequent 06/26/2016  . Osteoporosis 01/12/2016  . Dyslipidemia 06/14/2015  . Essential hypertension 06/14/2015  . Mitral valve regurgitation 06/14/2015  . Tricuspid valve insufficiency 06/14/2015  . Vitamin D deficiency 06/14/2015  . Rhinitis, allergic 06/14/2015  . Renal insufficiency 06/14/2015  . 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 AVNRT (AV nodal re-entry tachycardia) ([facility]) 03/23/2014    [name], Annye Rusk MS, CCC-SLP 03/23/2020, 12:29 PM  [facility] [address] [place], [place], [address] Phone: [phone]   Fax:  [phone]   Name: [name] MRN: [id number] Date of Birth: 27-Jun-1933

## 2020-03-25 ENCOUNTER — Ambulatory Visit: Payer: Medicare PPO

## 2020-03-30 ENCOUNTER — Other Ambulatory Visit: Payer: Self-pay

## 2020-03-30 ENCOUNTER — Ambulatory Visit: Payer: Medicare PPO | Admitting: Speech Pathology

## 2020-03-30 DIAGNOSIS — R1312 Dysphagia, oropharyngeal phase: Secondary | ICD-10-CM | POA: Diagnosis not present

## 2020-03-30 NOTE — Patient Instructions (Addendum)
   Tomatoes Strawberry Hashbrowns Broccoli  Grapes Pizza- [employer] lets you order half amount of cheese, [employer] may offer this if you ask

## 2020-03-30 NOTE — Therapy (Signed)
[facility] [address] [place], [place], [address] Phone: [phone]   Fax:  [phone]  Speech Language Pathology Treatment  Patient Details  Name: [name] MRN: [id number] Date of Birth: 1933-07-09 Referring Provider (SLP): Dr. Wendall Mola   Encounter Date: 03/30/2020  End of Session - 03/30/20 1222    Visit Number  9    Number of Visits  17    Date for SLP Re-Evaluation  04/25/20    SLP Start Time  1147    SLP Stop Time   1215    SLP Time Calculation (min)  28 min    Activity Tolerance  Patient tolerated treatment well       Past Medical History:  Diagnosis Date  . Allergies   . Allergy   . Anxiety   . Arthritis   . Atrophic vaginitis   . Cataract    hx/o surgery, both eyes; Dr. Nile Riggs  . Complication of anesthesia   . Diverticulosis    per 2010 colonoscopy  . Dyslipidemia   . Full dentures   . GERD (gastroesophageal reflux disease)    hx/o, resolved  . Glaucoma 2016  . H/O echocardiogram 06/22/2010   normal LVEF, moderate mitral and tricuspid regurg 07/2016 Dr. Rennis Golden;  mild aortic valve stenosis, left ventricular normal, EF 55%; Dr. Allyson Sabal  06/2010  . History of cardiovascular stress test 06/22/2010   normal bruce myocadial perfusion study, 72% EF; Dr. Allyson Sabal  . History of mammogram    benign bilat calcifications, heterogeneously dense breasts, stable mammograms 2009-2014  . 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 De Blanch gyencology oncology [place]; Dr. Sylvester Harder gynecology  . Hypertension   . Lactose intolerance   . 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 Alanda Amass  . Osteoporosis   . PONV (postoperative nausea and vomiting)   . SVT  (supraventricular tachycardia) ([facility])    asymptomatic 2014, hx/o SVT, Dr. Alanda Amass  . Vitamin D deficiency     Past Surgical History:  Procedure Laterality Date  . ABDOMINAL HYSTERECTOMY Bilateral    h/o uterine cancer; oophrectomy  . CATARACT EXTRACTION     bilat  . COLONOSCOPY  08/2016   08/2016  Dr. Elnoria Howard advised no repeat colonoscopy;  scattered diverticla, medium hemorrhoids 2010; Dr. Elnoria Howard  . JOINT REPLACEMENT    . NM MYOCAR PERF WALL MOTION    . TONSILLECTOMY Right 12/28/2019   Procedure: TONSILLECTOMY;  Surgeon: Christia Reading, MD;  Location: [facility];  Service: ENT;  Laterality: Right;  . TOTAL HIP ARTHROPLASTY Bilateral 2008, 2010    twice left (complication on the left, once on the right; Dr. Dorene Grebe  . TRANSTHORACIC ECHOCARDIOGRAM  02/2013   ordered for SVT; EF 55-65%; calcified MV annulus, mod MR; RSVP increased; RA mildly dilated; mod TR; PA peak pressure    There were no vitals filed for this visit.  Subjective Assessment - 03/30/20 1203    Subjective  "I have tried okra and meatloaf"    Currently in Pain?  No/denies            ADULT SLP TREATMENT - 03/30/20 1204      General Information   Behavior/Cognition  Alert;Cooperative;Pleasant mood      Treatment Provided  Treatment provided  Cognitive-Linquistic      Dysphagia Treatment   Temperature Spikes Noted  No    Respiratory Status  Room air    Oral Cavity - Dentition  Adequate natural dentition    Treatment Methods  Skilled observation;Therapeutic exercise;Compensation strategy training;Patient/caregiver education    Patient observed directly with PO's  Yes    Type of PO's observed  Dysphagia 3 (soft);Thin liquids    Feeding  Able to feed self    Liquids provided via  Cup    Oral Phase Signs & Symptoms  Other (comment)   Improved speed of oral phase with increased confidence   Type of cueing  Verbal    Amount of cueing  Modified independent    Other treatment/comments   pear slices, cubed chicken and ham, water bottle - no overt s/s of aspiration; oral phase improved. Pt independently verbalized foods she should avoid and verbalized s/s of aspiration pna      Assessment / Recommendations / Plan   Plan  Continue with current plan of care      Dysphagia Recommendations   Diet recommendations  Dysphagia 3 (mechanical soft)    Liquids provided via  Cup    Medication Administration  Whole meds with liquid    Supervision  Patient able to self feed    Compensations  Slow rate;Follow solids with liquid;Multiple dry swallows after each bite/sip    Postural Changes and/or Swallow Maneuvers  Chin tuck;Head turn right during swallow   no distractions with meals     Progression Toward Goals   Progression toward goals  Progressing toward goals       SLP Education - 03/30/20 1220    Education Details  lists of mech soft foods to try; foods to avoid       SLP Short Term Goals - 03/30/20 1221      SLP SHORT TERM GOAL #1   Title  Pt will complete HEP for dysphagia with occasional min A over 2 sessions    Baseline  03-07-20    Status  Achieved      SLP SHORT TERM GOAL #2   Title  Pt will follow swallow precautions with rare min A over 2 sessions    Baseline  03-15-20, 03-16-20    Status  Achieved      SLP SHORT TERM GOAL #3   Title  Pt/family witll verbalize s/s of aspiration pna with rare min A    Status  Achieved       SLP Long Term Goals - 03/30/20 1221      SLP LONG TERM GOAL #1   Title  Pt will complete HEP for dysphagia with rare min A over 4 sessions    Baseline  03-15-20, 03-16-20, 03-23-20, 03/30/20    Time  5    Period  Weeks    Status  Achieved      SLP LONG TERM GOAL #2   Title  Pt will follow swallow precautions with mod I over 4 sessions    Baseline  03-15-20, 03-16-20, 03-23-20, 03/30/20    Time  4    Period  Weeks    Status  On-going      SLP LONG TERM GOAL #3   Title  Pt will tolerate Dysphagia 3 diet with no overt s/s of aspiration x3  sessions    Baseline  03-16-20, 03-23-20, 03/30/20    Time  4    Status  On-going  Plan - 03/30/20 1221    Clinical Impression Statement  Repeat FEES 03/14/20 revealed improved swallow with mild to moderate pharyngeal residue.which was reduced with head turn right and chin tuck, and liquid wash. Pt can advance to Dys 3 and thin liquids with precautions. Recommend to repeat FEES upon completion of this course of ST. Pt continues to be mod I with swallow precautions and HEP, decrease to 1x a week to ensure caryover of precautions and diet advancement. Expect d/c 1-2 sessions with f/u FEES upon d/c at [facility] Therapy Frequency  1x /week    Duration  --   8 weeks or 17 visits   Treatment/Interventions  Aspiration precaution training;Pharyngeal strengthening exercises;Diet toleration management by SLP;Trials of upgraded texture/liquids;Environmental controls;Compensatory techniques;Other (comment);Oral motor exercises;Internal/external aids;Patient/family education    Potential to Achieve Goals  Good    SLP Home Exercise Plan  Effortful swallow, lingual sweep       Patient will benefit from skilled therapeutic intervention in order to improve the following deficits and impairments:   Dysphagia, oropharyngeal phase    Problem List Patient Active Problem List   Diagnosis Date Noted  . Abnormal chest x-ray 08/12/2019  . Encounter for health maintenance examination in adult 08/12/2019  . Need for influenza vaccination 08/12/2019  . Chest wall pain 06/11/2019  . Muscle strain 06/11/2019  . Abnormal EKG 06/11/2019  . Murmur 09/05/2018  . Aortic atherosclerosis ([facility]) 08/04/2018  . Elevated ferritin 08/04/2018  . Other fatigue 07/14/2018  . Iron disorder 07/14/2018  . Glaucoma 07/11/2017  . Vaccine counseling 07/11/2017  . Varicose vein of leg 07/11/2017  . Need for shingles vaccine 07/11/2017  . Hearing loss 06/27/2016  . Medicare annual wellness visit, subsequent 06/26/2016   . Osteoporosis 01/12/2016  . Dyslipidemia 06/14/2015  . Essential hypertension 06/14/2015  . Mitral valve regurgitation 06/14/2015  . Tricuspid valve insufficiency 06/14/2015  . Vitamin D deficiency 06/14/2015  . Rhinitis, allergic 06/14/2015  . Renal insufficiency 06/14/2015  . 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 Lown Maryla Morrow syndrome 03/23/2014  . AVNRT (AV nodal re-entry tachycardia) ([facility]) 03/23/2014    Verlee Pope, Radene Journey MS, CCC-SLP 03/30/2020, 12:23 PM  [facility] [address] [place], [place], [address] Phone: [phone]   Fax:  [phone]   Name: [name] MRN: [id number] Date of Birth: [birth year]

## 2020-04-04 ENCOUNTER — Other Ambulatory Visit: Payer: Self-pay

## 2020-04-04 ENCOUNTER — Encounter: Payer: Self-pay | Admitting: Family Medicine

## 2020-04-04 ENCOUNTER — Ambulatory Visit: Payer: Medicare PPO | Admitting: Family Medicine

## 2020-04-04 VITALS — BP 155/84 | HR 84 | Ht 61.25 in | Wt 144.4 lb

## 2020-04-04 DIAGNOSIS — E559 Vitamin D deficiency, unspecified: Secondary | ICD-10-CM

## 2020-04-04 DIAGNOSIS — I700 Atherosclerosis of aorta: Secondary | ICD-10-CM

## 2020-04-04 DIAGNOSIS — M810 Age-related osteoporosis without current pathological fracture: Secondary | ICD-10-CM

## 2020-04-04 DIAGNOSIS — I10 Essential (primary) hypertension: Secondary | ICD-10-CM

## 2020-04-04 DIAGNOSIS — R1313 Dysphagia, pharyngeal phase: Secondary | ICD-10-CM | POA: Insufficient documentation

## 2020-04-04 DIAGNOSIS — E785 Hyperlipidemia, unspecified: Secondary | ICD-10-CM

## 2020-04-04 DIAGNOSIS — R7989 Other specified abnormal findings of blood chemistry: Secondary | ICD-10-CM

## 2020-04-04 DIAGNOSIS — J309 Allergic rhinitis, unspecified: Secondary | ICD-10-CM

## 2020-04-04 DIAGNOSIS — C099 Malignant neoplasm of tonsil, unspecified: Secondary | ICD-10-CM | POA: Diagnosis not present

## 2020-04-04 MED ORDER — MONTELUKAST SODIUM 10 MG PO TABS: 10.0000 mg | ORAL_TABLET | Freq: Every day | ORAL | 11 refills | Status: DC

## 2020-04-04 NOTE — Progress Notes (Signed)
Office Visit Note   Patient: [name]           Date of Birth: 04/07/1933           MRN: [id number] Visit Date: 04/04/2020 Requested by: Carlena Hurl, PA-C [address] [place],  [place] [address] PCP: Eunice Blase, MD  Subjective: Chief Complaint  Patient presents with  . establish primary care    HPI: She is here to establish care.  Her [name] is a patient of mine.  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  She is recovering nicely from that.  She is struggling with seasonal allergies right now.  She is using Zyrtec and Flonase.  The medicines help, but she thinks Flonase is increasing her blood pressure.  She would like to try something different.  Hypertension has generally been well controlled with lisinopril and atenolol.  She has been asymptomatic on that regimen.  She has a history of osteoporosis but did not tolerate Fosamax.  She is now taking vitamin D and calcium.  Hyperlipidemia is controlled with medication.  She has a history of elevated ferritin monitored by hematology a few years ago.  She was told to avoid iron supplements.  She will be due for her annual wellness exam at the end of August.                ROS:   All other systems were reviewed and are negative.  Objective: Vital Signs: BP (!) 155/84   Pulse 84   Ht 5' 1.25" (1.556 m)   Wt 144 lb 6.4 oz (65.5 kg)   BMI 27.06 kg/m   Physical Exam:  General:  Alert and oriented, in no acute distress. Pulm:  Breathing unlabored. Psy:  Normal mood, congruent affect.  HEENT: Oropharynx looks clear today.  Neck has no lymphadenopathy, no carotid bruits.  No thyromegaly. CV: Regular rate and rhythm without murmurs, rubs, or gallops.  No peripheral edema.  2+ radial and posterior tibial pulses. Lungs: Clear to auscultation throughout with no wheezing or areas of consolidation.     Imaging: None today  Assessment & Plan: 1.  Visit to  establish care. -Return at the end of August for a wellness exam with labs.  2.  Doing well status post surgery for right tonsil cancer.  Monitored by oncology every 3 months.  3.  Seasonal allergies -We will add Singulair and discontinue Flonase.  4.  Hypertension -No change in her medicines right now.  We will recheck in August.  5.  Osteoporosis -We will periodically monitor bone density. - Monitor vitamin D deficiency with next labs.  6.  History of renal insufficiency, hyperlipidemia, elevated ferritin.     Procedures: No procedures performed  No notes on file     PMFS History: Patient Active Problem List   Diagnosis Date Noted  . Pharyngeal dysphagia 04/04/2020  . Tonsil cancer ([place]) 01/01/2020  . Tonsil asymmetry 11/03/2019  . Abnormal chest x-ray 08/12/2019  . Encounter for health maintenance examination in adult 08/12/2019  . Need for influenza vaccination 08/12/2019  . Chest wall pain 06/11/2019  . Muscle strain 06/11/2019  . Abnormal EKG 06/11/2019  . Murmur 09/05/2018  . Aortic atherosclerosis ([place]) 08/04/2018  . Elevated ferritin 08/04/2018  . Other fatigue 07/14/2018  . Iron disorder 07/14/2018  . Primary open angle glaucoma of both eyes, mild stage 10/14/2017  . Glaucoma 07/11/2017  . Vaccine counseling 07/11/2017  .  Varicose vein of leg 07/11/2017  . Need for shingles vaccine 07/11/2017  . Hearing loss 06/27/2016  . Medicare annual wellness visit, subsequent 06/26/2016  . Osteoporosis 01/12/2016  . Dyslipidemia 06/14/2015  . Essential hypertension 06/14/2015  . Mitral valve regurgitation 06/14/2015  . Tricuspid valve insufficiency 06/14/2015  . Vitamin D deficiency 06/14/2015  . Rhinitis, allergic 06/14/2015  . Renal insufficiency 06/14/2015  . 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 AVNRT (AV nodal re-entry tachycardia) ([place]) 03/23/2014   Past Medical History:  Diagnosis Date    . Allergies   . Allergy   . Anxiety   . Arthritis   . Atrophic vaginitis   . Cataract    hx/o surgery, both eyes; Dr. Gershon Crane  . Complication of anesthesia   . Diverticulosis    per 2010 colonoscopy  . Dyslipidemia   . Full dentures   . GERD (gastroesophageal reflux disease)    hx/o, resolved  . Glaucoma 2016  . H/O echocardiogram 06/22/2010   normal LVEF, moderate mitral and tricuspid regurg 07/2016 [name];  mild aortic valve stenosis, left ventricular normal, EF 55%; Dr. Gwenlyn Found  06/2010  . History of cardiovascular stress test 06/22/2010   normal bruce myocadial perfusion study, 72% EF; Dr. Gwenlyn Found  . History of mammogram    benign bilat calcifications, heterogeneously dense breasts, stable mammograms 2009-2014  . 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 Marti Sleigh gyencology oncology [place]; Dr. Lorriane Shire gynecology  . Hypertension   . Lactose intolerance   . 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 Rollene Fare  . Osteoporosis   . PONV (postoperative nausea and vomiting)   . SVT (supraventricular tachycardia) ([place])    asymptomatic 2014, hx/o SVT, Dr. Rollene Fare  . Vitamin D deficiency     Family History  Family history unknown: Yes    Past Surgical History:  Procedure Laterality Date  . ABDOMINAL HYSTERECTOMY Bilateral    h/o uterine cancer; oophrectomy  . CATARACT EXTRACTION     bilat  . COLONOSCOPY  08/2016   08/2016  Dr. Benson Norway advised no repeat colonoscopy;  scattered diverticla, medium hemorrhoids 2010; Dr. Benson Norway  . JOINT REPLACEMENT    . NM MYOCAR PERF WALL MOTION    . TONSILLECTOMY Right 12/28/2019   Procedure: TONSILLECTOMY;  Surgeon: Melida Quitter, MD;  Location: [facility];  Service: ENT;  Laterality: Right;  . TOTAL HIP  ARTHROPLASTY Bilateral 2008, 2010    twice left (complication on the left, once on the right; Dr. Alphonzo Severance  . TRANSTHORACIC ECHOCARDIOGRAM  02/2013   ordered for SVT; EF 55-65%; calcified MV annulus, mod MR; RSVP increased; RA mildly dilated; mod TR; PA peak pressure 40mmHg   Social History   Occupational History  . Not on file  Tobacco Use  . Smoking status: Never Smoker  . Smokeless tobacco: Never Used  Substance and Sexual Activity  . Alcohol use: No  . Drug use: No  . Sexual activity: Never    Birth control/protection: Surgical    Comment: walks 3x/wk, retired, married, 2 grandchildren

## 2020-04-06 ENCOUNTER — Ambulatory Visit: Payer: Medicare PPO | Admitting: Speech Pathology

## 2020-04-06 ENCOUNTER — Encounter: Payer: Self-pay | Admitting: Speech Pathology

## 2020-04-06 ENCOUNTER — Other Ambulatory Visit: Payer: Self-pay

## 2020-04-06 DIAGNOSIS — R1312 Dysphagia, oropharyngeal phase: Secondary | ICD-10-CM

## 2020-04-06 NOTE — Therapy (Signed)
[facility] [address] [place], [place], [address] Phone: [phone]   Fax:  [phone]  Speech Language Pathology Treatment  Patient Details  Name: [name] MRN: [id number] Date of Birth: 1933/11/24 Referring Provider (SLP): Dr. Fredricka Bonine  Progress Note pt seen for 10th visit, reporting period 02/24/20 to 04/06/20   Encounter Date: 04/06/2020  End of Session - 04/06/20 1221    Visit Number  10    Number of Visits  17    Date for SLP Re-Evaluation  04/25/20    SLP Start Time  [id number]    SLP Stop Time   1216    SLP Time Calculation (min)  28 min    Activity Tolerance  Patient tolerated treatment well       Past Medical History:  Diagnosis Date  . Allergies   . Allergy   . Anxiety   . Arthritis   . Atrophic vaginitis   . Cataract    hx/o surgery, both eyes; Dr. Gershon Crane  . Complication of anesthesia   . Diverticulosis    per 2010 colonoscopy  . Dyslipidemia   . Full dentures   . GERD (gastroesophageal reflux disease)    hx/o, resolved  . Glaucoma 2016  . H/O echocardiogram 06/22/2010   normal LVEF, moderate mitral and tricuspid regurg 07/2016 [name];  mild aortic valve stenosis, left ventricular normal, EF 55%; Dr. Gwenlyn Found  06/2010  . History of cardiovascular stress test 06/22/2010   normal bruce myocadial perfusion study, 72% EF; Dr. Gwenlyn Found  . History of mammogram    benign bilat calcifications, heterogeneously dense breasts, stable mammograms 2009-2014  . 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 Marti Sleigh gyencology oncology [place]; Dr. Lorriane Shire gynecology  . Hypertension   . Lactose intolerance   . 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 Rollene Fare  .  Osteoporosis   . PONV (postoperative nausea and vomiting)   . SVT (supraventricular tachycardia) ([place])    asymptomatic 2014, hx/o SVT, Dr. Rollene Fare  . Vitamin D deficiency     Past Surgical History:  Procedure Laterality Date  . ABDOMINAL HYSTERECTOMY Bilateral    h/o uterine cancer; oophrectomy  . CATARACT EXTRACTION     bilat  . COLONOSCOPY  08/2016   08/2016  Dr. Benson Norway advised no repeat colonoscopy;  scattered diverticla, medium hemorrhoids 2010; Dr. Benson Norway  . JOINT REPLACEMENT    . NM MYOCAR PERF WALL MOTION    . TONSILLECTOMY Right 12/28/2019   Procedure: TONSILLECTOMY;  Surgeon: Melida Quitter, MD;  Location: [facility];  Service: ENT;  Laterality: Right;  . TOTAL HIP ARTHROPLASTY Bilateral 2008, 2010    twice left (complication on the left, once on the right; Dr. Alphonzo Severance  . TRANSTHORACIC ECHOCARDIOGRAM  02/2013   ordered for SVT; EF 55-65%; calcified MV annulus, mod MR; RSVP increased; RA mildly dilated; mod TR; PA peak pressure 75mmHg    There were no vitals filed for this visit.  Subjective Assessment - 04/06/20 1155    Subjective  "I tried to swallow with my head straight and it didn't go well - I'm not ready for that yet."    Currently in Pain?  No/denies  head turn right and chin tuck, and liquid wash. Pt can advance to Dys 3 and thin liquids with precautions. Recommend to repeat FEES upon completion of this course of ST. Pt continues to be mod I with swallow precautions and HEP, decrease to 1x a week to ensure caryover of precautions and diet advancement. Expect d/c next session with f/u FEES upon d/c at [place] Frequency  1x /week    Duration  --   8 weeks or 17 visits   Treatment/Interventions  Aspiration precaution training;Pharyngeal strengthening exercises;Diet toleration management by SLP;Trials of upgraded texture/liquids;Environmental controls;Compensatory techniques;Other (comment);Oral motor exercises;Internal/external aids;Patient/family education    Potential to Achieve Goals  Good       Patient will benefit from skilled therapeutic intervention in order to improve the following deficits and impairments:   Dysphagia, oropharyngeal phase    Problem List Patient Active Problem List   Diagnosis Date Noted  . Pharyngeal dysphagia 04/04/2020  . 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 Tonsil asymmetry 11/03/2019  . Abnormal chest x-ray 08/12/2019  . Encounter for health maintenance examination in adult 08/12/2019  . Need for influenza vaccination 08/12/2019  . Chest wall pain 06/11/2019  . Muscle strain 06/11/2019  . Abnormal EKG 06/11/2019  . Murmur 09/05/2018  . Aortic atherosclerosis ([place]) 08/04/2018  . Elevated ferritin 08/04/2018  . Other fatigue 07/14/2018  . Iron disorder 07/14/2018  . Primary open angle glaucoma of both eyes, mild stage 10/14/2017  . Glaucoma 07/11/2017  . Vaccine counseling 07/11/2017  . Varicose vein of leg 07/11/2017  . Need for shingles vaccine 07/11/2017  . Hearing loss 06/27/2016  . Medicare annual wellness visit, subsequent 06/26/2016  . Osteoporosis 01/12/2016  . Dyslipidemia 06/14/2015   . Essential hypertension 06/14/2015  . Mitral valve regurgitation 06/14/2015  . Tricuspid valve insufficiency 06/14/2015  . Vitamin D deficiency 06/14/2015  . Rhinitis, allergic 06/14/2015  . Renal insufficiency 06/14/2015  . 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 AVNRT (AV nodal re-entry tachycardia) ([place]) 03/23/2014    Leverne Amrhein, Annye Rusk MS, CCC-SLP 04/06/2020, 12:22 PM  [facility] [address] [place], [place], [address] Phone: [phone]   Fax:  [phone]   Name: [name] MRN: [id number] Date of Birth: [birth year]  [facility] [address] [place], [place], [address] Phone: [phone]   Fax:  [phone]  Speech Language Pathology Treatment  Patient Details  Name: [name] MRN: [id number] Date of Birth: 1933/11/24 Referring Provider (SLP): Dr. Fredricka Bonine  Progress Note pt seen for 10th visit, reporting period 02/24/20 to 04/06/20   Encounter Date: 04/06/2020  End of Session - 04/06/20 1221    Visit Number  10    Number of Visits  17    Date for SLP Re-Evaluation  04/25/20    SLP Start Time  [id number]    SLP Stop Time   1216    SLP Time Calculation (min)  28 min    Activity Tolerance  Patient tolerated treatment well       Past Medical History:  Diagnosis Date  . Allergies   . Allergy   . Anxiety   . Arthritis   . Atrophic vaginitis   . Cataract    hx/o surgery, both eyes; Dr. Gershon Crane  . Complication of anesthesia   . Diverticulosis    per 2010 colonoscopy  . Dyslipidemia   . Full dentures   . GERD (gastroesophageal reflux disease)    hx/o, resolved  . Glaucoma 2016  . H/O echocardiogram 06/22/2010   normal LVEF, moderate mitral and tricuspid regurg 07/2016 [name];  mild aortic valve stenosis, left ventricular normal, EF 55%; Dr. Gwenlyn Found  06/2010  . History of cardiovascular stress test 06/22/2010   normal bruce myocadial perfusion study, 72% EF; Dr. Gwenlyn Found  . History of mammogram    benign bilat calcifications, heterogeneously dense breasts, stable mammograms 2009-2014  . 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 Marti Sleigh gyencology oncology [place]; Dr. Lorriane Shire gynecology  . Hypertension   . Lactose intolerance   . 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 Rollene Fare  .  Osteoporosis   . PONV (postoperative nausea and vomiting)   . SVT (supraventricular tachycardia) ([place])    asymptomatic 2014, hx/o SVT, Dr. Rollene Fare  . Vitamin D deficiency     Past Surgical History:  Procedure Laterality Date  . ABDOMINAL HYSTERECTOMY Bilateral    h/o uterine cancer; oophrectomy  . CATARACT EXTRACTION     bilat  . COLONOSCOPY  08/2016   08/2016  Dr. Benson Norway advised no repeat colonoscopy;  scattered diverticla, medium hemorrhoids 2010; Dr. Benson Norway  . JOINT REPLACEMENT    . NM MYOCAR PERF WALL MOTION    . TONSILLECTOMY Right 12/28/2019   Procedure: TONSILLECTOMY;  Surgeon: Melida Quitter, MD;  Location: [facility];  Service: ENT;  Laterality: Right;  . TOTAL HIP ARTHROPLASTY Bilateral 2008, 2010    twice left (complication on the left, once on the right; Dr. Alphonzo Severance  . TRANSTHORACIC ECHOCARDIOGRAM  02/2013   ordered for SVT; EF 55-65%; calcified MV annulus, mod MR; RSVP increased; RA mildly dilated; mod TR; PA peak pressure 75mmHg    There were no vitals filed for this visit.  Subjective Assessment - 04/06/20 1155    Subjective  "I tried to swallow with my head straight and it didn't go well - I'm not ready for that yet."    Currently in Pain?  No/denies

## 2020-04-15 ENCOUNTER — Ambulatory Visit: Payer: Medicare PPO | Admitting: Speech Pathology

## 2020-04-15 ENCOUNTER — Other Ambulatory Visit: Payer: Self-pay

## 2020-04-15 DIAGNOSIS — R1312 Dysphagia, oropharyngeal phase: Secondary | ICD-10-CM | POA: Diagnosis not present

## 2020-04-15 NOTE — Therapy (Signed)
[facility] [address] [place], [place], [address] Phone: [phone]   Fax:  [phone]  Speech Language Pathology Treatment & Discharge Summary  Patient Details  Name: [name] MRN: [id number] Date of Birth: [birth year] Referring Provider (SLP): Dr. Fredricka Bonine   Encounter Date: 04/15/2020  End of Session - 04/15/20 1142    Visit Number  11    Number of Visits  17    Date for SLP Re-Evaluation  04/25/20    SLP Start Time  45    SLP Stop Time   1134    SLP Time Calculation (min)  32 min    Activity Tolerance  Patient tolerated treatment well       Past Medical History:  Diagnosis Date  . Allergies   . Allergy   . Anxiety   . Arthritis   . Atrophic vaginitis   . Cataract    hx/o surgery, both eyes; Dr. Gershon Crane  . Complication of anesthesia   . Diverticulosis    per 2010 colonoscopy  . Dyslipidemia   . Full dentures   . GERD (gastroesophageal reflux disease)    hx/o, resolved  . Glaucoma 2016  . H/O echocardiogram 06/22/2010   normal LVEF, moderate mitral and tricuspid regurg 07/2016 [name];  mild aortic valve stenosis, left ventricular normal, EF 55%; Dr. Gwenlyn Found  06/2010  . History of cardiovascular stress test 06/22/2010   normal bruce myocadial perfusion study, 72% EF; Dr. Gwenlyn Found  . History of mammogram    benign bilat calcifications, heterogeneously dense breasts, stable mammograms 2009-2014  . 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 Marti Sleigh gyencology oncology [place]; Dr. Lorriane Shire gynecology  . Hypertension   . Lactose intolerance   . 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 Rollene Fare  . Osteoporosis   . PONV (postoperative nausea and vomiting)   .  SVT (supraventricular tachycardia) ([place])    asymptomatic 2014, hx/o SVT, Dr. Rollene Fare  . Vitamin D deficiency     Past Surgical History:  Procedure Laterality Date  . ABDOMINAL HYSTERECTOMY Bilateral    h/o uterine cancer; oophrectomy  . CATARACT EXTRACTION     bilat  . COLONOSCOPY  08/2016   08/2016  Dr. Benson Norway advised no repeat colonoscopy;  scattered diverticla, medium hemorrhoids 2010; Dr. Benson Norway  . JOINT REPLACEMENT    . NM MYOCAR PERF WALL MOTION    . TONSILLECTOMY Right 12/28/2019   Procedure: TONSILLECTOMY;  Surgeon: Melida Quitter, MD;  Location: [facility];  Service: ENT;  Laterality: Right;  . TOTAL HIP ARTHROPLASTY Bilateral 2008, 2010    twice left (complication on the left, once on the right; Dr. Alphonzo Severance  . TRANSTHORACIC ECHOCARDIOGRAM  02/2013   ordered for SVT; EF 55-65%; calcified MV annulus, mod MR; RSVP increased; RA mildly dilated; mod TR; PA peak pressure 40mHg    There were no vitals filed for this visit.         ADULT SLP TREATMENT - 04/15/20 1135      General Information   Behavior/Cognition  Alert;Cooperative;Pleasant mood      Treatment Provided   Treatment provided  Dysphagia      Dysphagia Treatment   Temperature Spikes Noted  No    Respiratory Status  03-16-20, 03-23-20, 03/30/20    Time  4    Status  Achieved       Plan - 04/15/20 1140    Clinical Impression Statement  [name] in independent in HEP for dysphagia and swallow preacations. She is following diet modifications independently and has improved variety of foods she is eating. Recommend d/c outpt ST. [name] is to f/u with SLP Elmyra Ricks at [facility] for repeat FEES upon d/c.    Speech Therapy Frequency  1x /week    Duration  --   8 weeks or 17 visits   Treatment/Interventions  Aspiration precaution training;Pharyngeal strengthening exercises;Diet toleration management by SLP;Trials of upgraded texture/liquids;Environmental controls;Compensatory techniques;Other (comment);Oral motor exercises;Internal/external aids;Patient/family education    Potential to Achieve Goals  Good       Patient will benefit from skilled therapeutic intervention in order to improve the following deficits and impairments:   Dysphagia, oropharyngeal phase    Problem List Patient Active Problem List   Diagnosis Date Noted  . Pharyngeal dysphagia 04/04/2020  . 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 Tonsil asymmetry 11/03/2019  . Abnormal chest x-ray 08/12/2019  . Encounter for health maintenance examination in adult 08/12/2019  . Need for influenza vaccination 08/12/2019  . Chest wall pain 06/11/2019  . Muscle strain 06/11/2019  . Abnormal EKG 06/11/2019  . Murmur 09/05/2018  . Aortic atherosclerosis ([place]) 08/04/2018  . Elevated ferritin 08/04/2018  . Other fatigue 07/14/2018  . Iron disorder 07/14/2018  . Primary open angle glaucoma of both eyes, mild stage 10/14/2017  . Glaucoma 07/11/2017  . Vaccine counseling 07/11/2017  . Varicose vein of leg 07/11/2017  . Need for shingles vaccine 07/11/2017  . Hearing loss 06/27/2016  . Medicare  annual wellness visit, subsequent 06/26/2016  . Osteoporosis 01/12/2016  . Dyslipidemia 06/14/2015  . Essential hypertension 06/14/2015  . Mitral valve regurgitation 06/14/2015  . Tricuspid valve insufficiency 06/14/2015  . Vitamin D deficiency 06/14/2015  . Rhinitis, allergic 06/14/2015  . Renal insufficiency 06/14/2015  . 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 AVNRT (AV nodal re-entry tachycardia) ([place]) 03/23/2014    [name], Emma Rusk  MS, CCC-SLP 04/15/2020, 11:43 AM  [facility] [address] [place], [place], [address] Phone: [phone]   Fax:  [phone]   Name: MAKEYA HILGERT MRN: [id number] Date of Birth: [birth year]  [facility] [address] [place], [place], [address] Phone: [phone]   Fax:  [phone]  Speech Language Pathology Treatment & Discharge Summary  Patient Details  Name: [name] MRN: [id number] Date of Birth: [birth year] Referring Provider (SLP): Dr. Fredricka Bonine   Encounter Date: 04/15/2020  End of Session - 04/15/20 1142    Visit Number  11    Number of Visits  17    Date for SLP Re-Evaluation  04/25/20    SLP Start Time  45    SLP Stop Time   1134    SLP Time Calculation (min)  32 min    Activity Tolerance  Patient tolerated treatment well       Past Medical History:  Diagnosis Date  . Allergies   . Allergy   . Anxiety   . Arthritis   . Atrophic vaginitis   . Cataract    hx/o surgery, both eyes; Dr. Gershon Crane  . Complication of anesthesia   . Diverticulosis    per 2010 colonoscopy  . Dyslipidemia   . Full dentures   . GERD (gastroesophageal reflux disease)    hx/o, resolved  . Glaucoma 2016  . H/O echocardiogram 06/22/2010   normal LVEF, moderate mitral and tricuspid regurg 07/2016 [name];  mild aortic valve stenosis, left ventricular normal, EF 55%; Dr. Gwenlyn Found  06/2010  . History of cardiovascular stress test 06/22/2010   normal bruce myocadial perfusion study, 72% EF; Dr. Gwenlyn Found  . History of mammogram    benign bilat calcifications, heterogeneously dense breasts, stable mammograms 2009-2014  . 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 Marti Sleigh gyencology oncology [place]; Dr. Lorriane Shire gynecology  . Hypertension   . Lactose intolerance   . 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 Rollene Fare  . Osteoporosis   . PONV (postoperative nausea and vomiting)   .  SVT (supraventricular tachycardia) ([place])    asymptomatic 2014, hx/o SVT, Dr. Rollene Fare  . Vitamin D deficiency     Past Surgical History:  Procedure Laterality Date  . ABDOMINAL HYSTERECTOMY Bilateral    h/o uterine cancer; oophrectomy  . CATARACT EXTRACTION     bilat  . COLONOSCOPY  08/2016   08/2016  Dr. Benson Norway advised no repeat colonoscopy;  scattered diverticla, medium hemorrhoids 2010; Dr. Benson Norway  . JOINT REPLACEMENT    . NM MYOCAR PERF WALL MOTION    . TONSILLECTOMY Right 12/28/2019   Procedure: TONSILLECTOMY;  Surgeon: Melida Quitter, MD;  Location: [facility];  Service: ENT;  Laterality: Right;  . TOTAL HIP ARTHROPLASTY Bilateral 2008, 2010    twice left (complication on the left, once on the right; Dr. Alphonzo Severance  . TRANSTHORACIC ECHOCARDIOGRAM  02/2013   ordered for SVT; EF 55-65%; calcified MV annulus, mod MR; RSVP increased; RA mildly dilated; mod TR; PA peak pressure 40mHg    There were no vitals filed for this visit.         ADULT SLP TREATMENT - 04/15/20 1135      General Information   Behavior/Cognition  Alert;Cooperative;Pleasant mood      Treatment Provided   Treatment provided  Dysphagia      Dysphagia Treatment   Temperature Spikes Noted  No    Respiratory Status

## 2020-04-15 NOTE — Patient Instructions (Signed)
   [facility] Speech Therapist is Doneen Poisson [phone]  Ask her to look at your vocal folds because you are hoarse - see how long the hoarseness is expected to last  [facility] has a voice lab they can check out your vocal folds if it is recommended  Keep up the good work!!  No rice, corn bread, popcorn, nuts, pork chops,  Pretzels, steak,

## 2020-04-26 IMAGING — CR CHEST - 2 VIEW
2 series · 2 of 2 positions shown · non-contrast
Comparison: 02/06/2018

CLINICAL DATA: Left lateral chest pain for 2 weeks.

EXAM:
CHEST - 2 VIEW

[w chest pa]
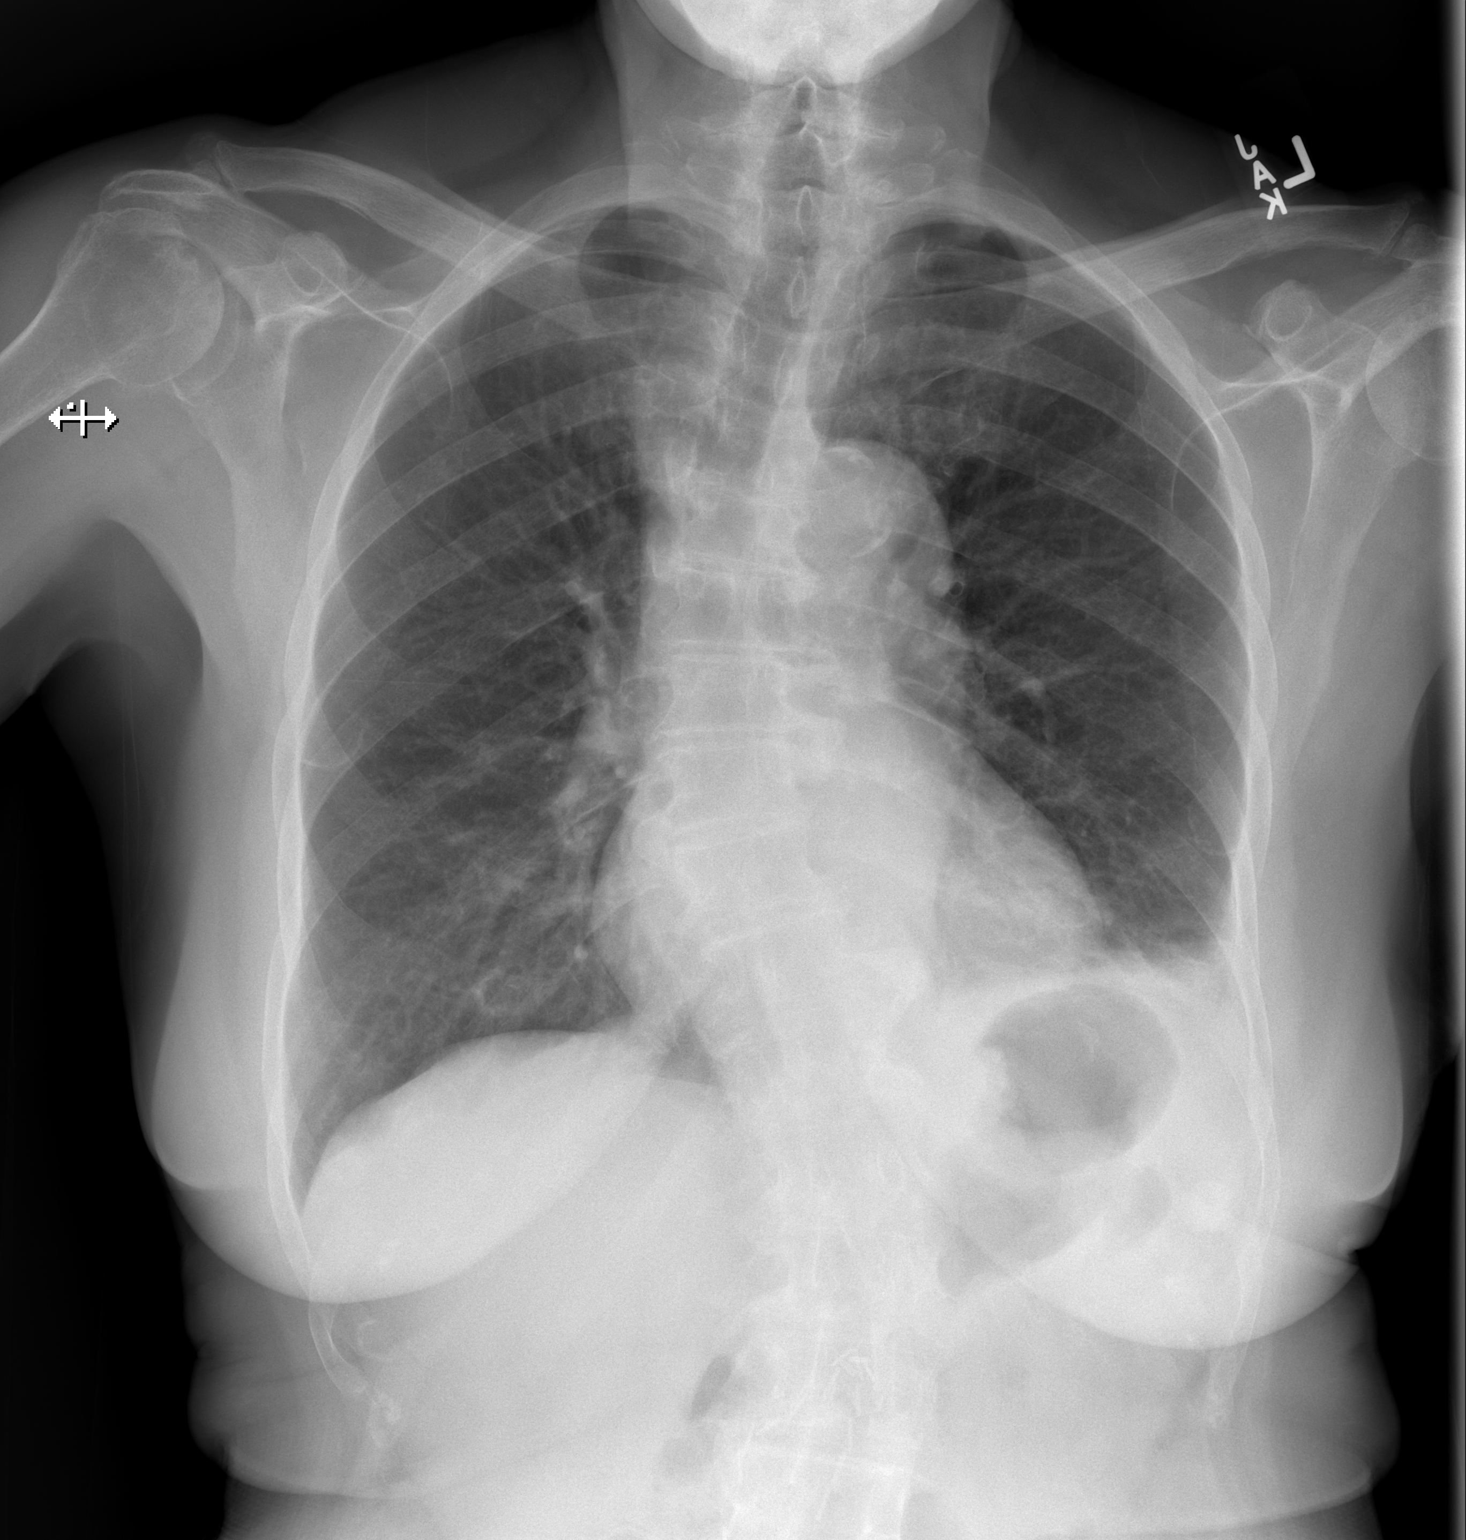

[w chest lat]
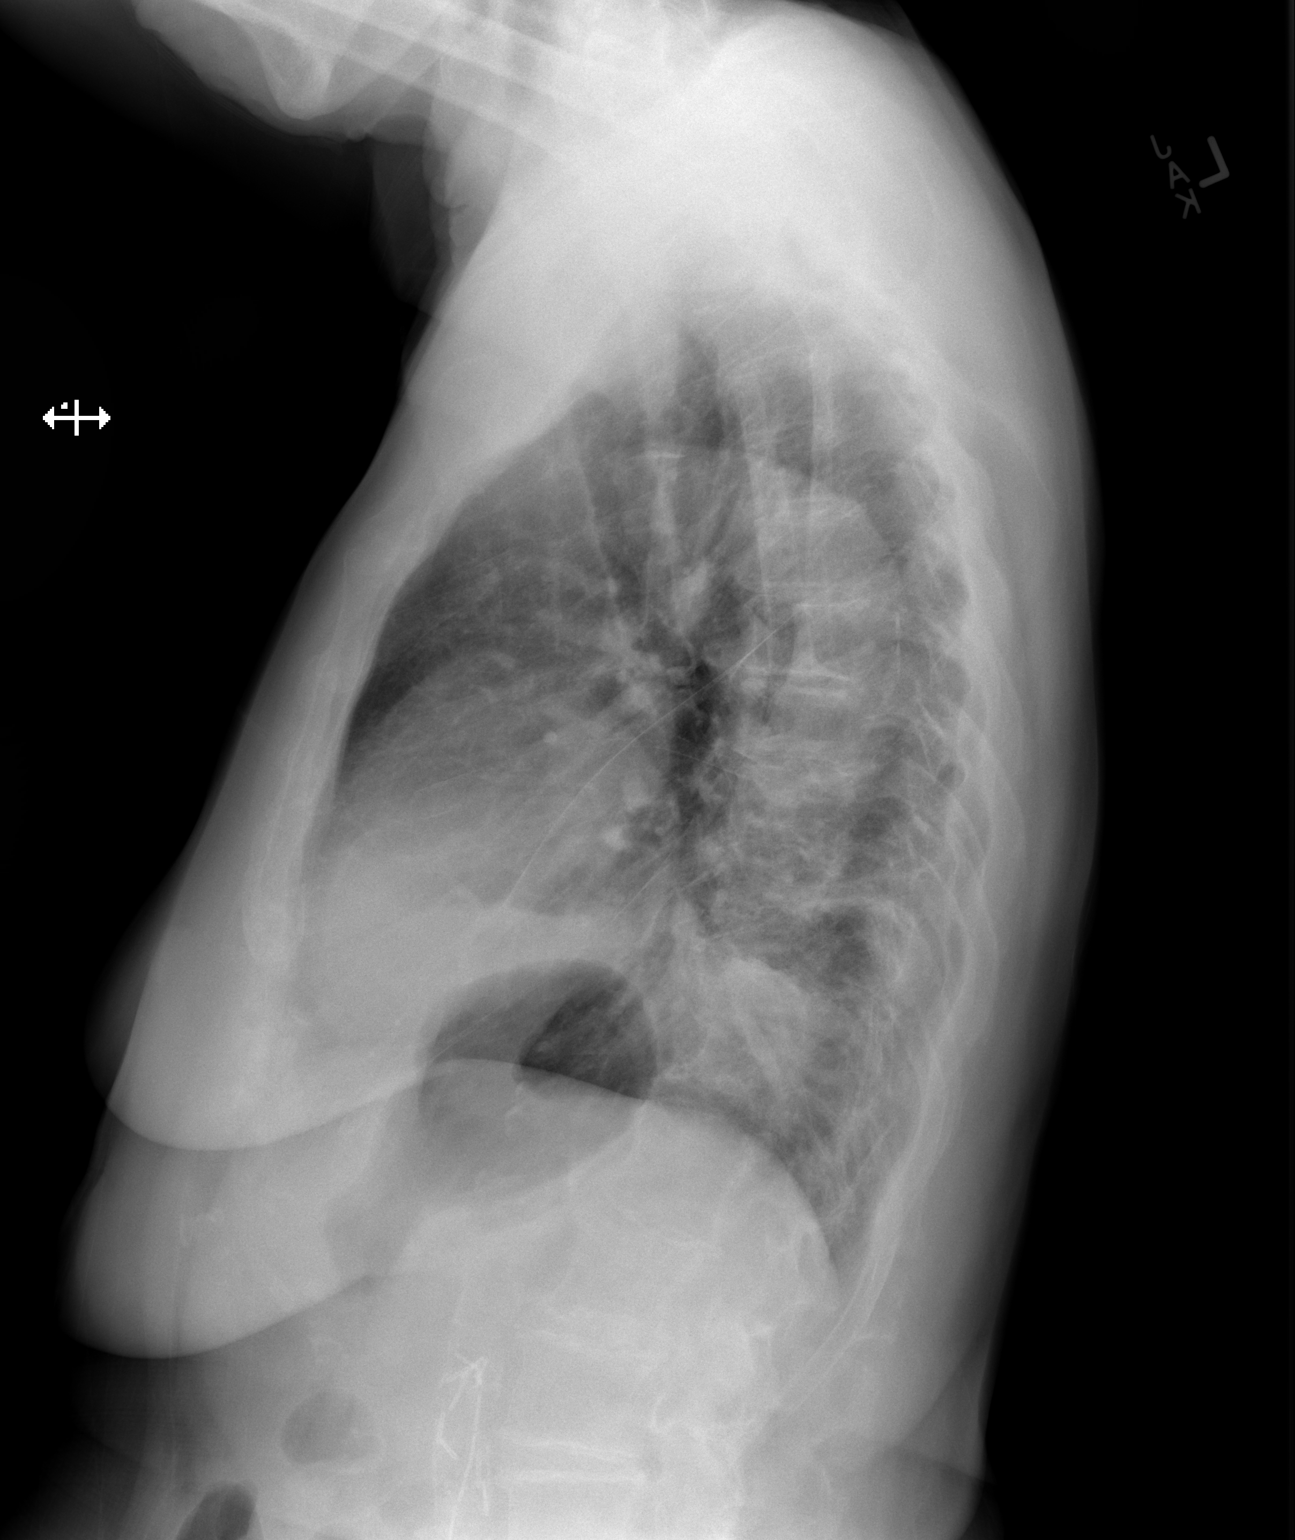

[2 of 2 positions shown; findings below may reference images not displayed]

FINDINGS: The cardiomediastinal silhouette is unchanged with normal heart
size. Aortic atherosclerosis is noted. There is new mild elevation
of the left hemidiaphragm with mild left basilar airspace opacity
and possible small left pleural effusion. The right lung remains
clear. No pneumothorax is identified. Moderate S-shaped
thoracolumbar scoliosis is again seen.
IMPRESSION: New elevation of the left hemidiaphragm with mild left basilar
atelectasis or pneumonia and possible small left pleural effusion.

## 2020-05-02 ENCOUNTER — Encounter: Payer: Self-pay | Admitting: Family Medicine

## 2020-05-04 ENCOUNTER — Encounter: Payer: Self-pay | Admitting: Family Medicine

## 2020-06-08 ENCOUNTER — Other Ambulatory Visit: Payer: Medicare PPO

## 2020-06-14 ENCOUNTER — Other Ambulatory Visit: Payer: Self-pay

## 2020-06-14 ENCOUNTER — Ambulatory Visit
Admission: RE | Admit: 2020-06-14 | Discharge: 2020-06-14 | Disposition: A | Discharge status: Home / Self Care | Payer: Medicare PPO | Source: Ambulatory Visit | Attending: Pulmonary Disease | Admitting: Pulmonary Disease

## 2020-06-14 DIAGNOSIS — R918 Other nonspecific abnormal finding of lung field: Secondary | ICD-10-CM

## 2020-06-17 ENCOUNTER — Telehealth: Payer: Self-pay | Admitting: Pulmonary Disease

## 2020-06-17 NOTE — Telephone Encounter (Signed)
Patient requesting results of CT scan. Message left that we will call her as soon as Dr. Valeta Harms reviews her scan.

## 2020-06-21 NOTE — Telephone Encounter (Signed)
Contacted patient with Dr. Juline Patch message about her CT scan. Patient verbalized understanding of results.

## 2020-06-21 NOTE — Telephone Encounter (Signed)
You can let her know that her ct looks stable. No additional follow up needed at this time.   Garner Nash, DO [facility] 06/21/2020 2:29 PM

## 2020-06-23 ENCOUNTER — Other Ambulatory Visit: Payer: Self-pay | Admitting: Physician Assistant

## 2020-08-01 DIAGNOSIS — C099 Malignant neoplasm of tonsil, unspecified: Secondary | ICD-10-CM | POA: Diagnosis not present

## 2020-08-01 DIAGNOSIS — Z85818 Personal history of malignant neoplasm of other sites of lip, oral cavity, and pharynx: Secondary | ICD-10-CM | POA: Diagnosis not present

## 2020-08-01 DIAGNOSIS — Z08 Encounter for follow-up examination after completed treatment for malignant neoplasm: Secondary | ICD-10-CM | POA: Diagnosis not present

## 2020-08-01 DIAGNOSIS — Z9089 Acquired absence of other organs: Secondary | ICD-10-CM | POA: Diagnosis not present

## 2020-08-15 ENCOUNTER — Encounter: Payer: Self-pay | Admitting: Family Medicine

## 2020-08-15 ENCOUNTER — Ambulatory Visit: Payer: Medicare PPO | Admitting: Family Medicine

## 2020-08-15 ENCOUNTER — Other Ambulatory Visit: Payer: Self-pay

## 2020-08-15 VITALS — BP 163/77 | HR 73 | Ht 61.5 in | Wt 146.8 lb

## 2020-08-15 DIAGNOSIS — I700 Atherosclerosis of aorta: Secondary | ICD-10-CM | POA: Diagnosis not present

## 2020-08-15 DIAGNOSIS — M810 Age-related osteoporosis without current pathological fracture: Secondary | ICD-10-CM

## 2020-08-15 DIAGNOSIS — Z0000 Encounter for general adult medical examination without abnormal findings: Secondary | ICD-10-CM | POA: Diagnosis not present

## 2020-08-15 DIAGNOSIS — J309 Allergic rhinitis, unspecified: Secondary | ICD-10-CM

## 2020-08-15 DIAGNOSIS — I10 Essential (primary) hypertension: Secondary | ICD-10-CM

## 2020-08-15 DIAGNOSIS — R7989 Other specified abnormal findings of blood chemistry: Secondary | ICD-10-CM

## 2020-08-15 DIAGNOSIS — E785 Hyperlipidemia, unspecified: Secondary | ICD-10-CM

## 2020-08-15 DIAGNOSIS — C099 Malignant neoplasm of tonsil, unspecified: Secondary | ICD-10-CM

## 2020-08-15 DIAGNOSIS — E559 Vitamin D deficiency, unspecified: Secondary | ICD-10-CM

## 2020-08-15 MED ORDER — SIMVASTATIN 40 MG PO TABS: ORAL_TABLET | ORAL | 3 refills | Status: AC

## 2020-08-15 MED ORDER — LISINOPRIL 10 MG PO TABS: 10.0000 mg | ORAL_TABLET | Freq: Every day | ORAL | 3 refills | Status: DC

## 2020-08-15 MED ORDER — ATENOLOL 50 MG PO TABS: 50.0000 mg | ORAL_TABLET | Freq: Every day | ORAL | 3 refills | Status: DC

## 2020-08-15 MED ORDER — CETIRIZINE HCL 10 MG PO TABS: 10.0000 mg | ORAL_TABLET | Freq: Every day | ORAL | 3 refills | Status: DC | PRN

## 2020-08-15 NOTE — Progress Notes (Signed)
Office Visit Note   Patient: [name]           Date of Birth: [birth year]           MRN: [id number] Visit Date: 08/15/2020 Requested by: Eunice Blase, MD [address] [place],  [place] [address] PCP: Eunice Blase, MD  Subjective: Chief Complaint  Patient presents with  . Medicare Wellness    HPI: She is here for annual wellness exam.  Overall she is feeling well.  Her only concern is seasonal allergies.  She has a lot of phlegm and has difficulty getting it up due to her throat surgery.  In the past she did well with Zyrtec and would like to try it again.  She is currently taking Singulair.  Otherwise her energy level is good, she is eating protein shakes since her appetite is still not great.  This seems to help her feel better.  Denies any pain.  She is trying to walk for exercise when weather permits.  She is not having any cardiac symptoms.  She sees her cardiologist in October.  She saw her throat surgeon recently, she goes every 3 months.  She goes to her eye doctor and dentist in the near future.  She no longer gets colonoscopies but she did have Cologuard testing a couple years ago.  She is up-to-date on mammograms.               ROS: No vision disturbance, no dental issues.  Denies bowel or bladder dysfunction.  All other systems were reviewed and are negative.  Objective: Vital Signs: BP (!) 163/77   Pulse 73   Ht 5' 1.5" (1.562 m)   Wt 146 lb 12.8 oz (66.6 kg)   BMI 27.29 kg/m   Physical Exam:  General:  Alert and oriented, in no acute distress. Pulm:  Breathing unlabored. Psy:  Normal mood, congruent affect. Skin: No suspicious lesions HEENT:  [place]/AT, PERRLA, EOM Full, no nystagmus.  Funduscopic examination within normal limits.  No conjunctival erythema.  Tympanic membranes are pearly gray with normal landmarks.  External ear canals are normal.  Nasal passages are clear.  Oropharynx is clear.  No significant lymphadenopathy.  No thyromegaly or  nodules.  2+ carotid pulses without bruits. CV: Regular rate and rhythm without murmurs, rubs, or gallops.  Trace bilateral ankle peripheral edema.  2+ radial and posterior tibial pulses. Lungs: Clear to auscultation throughout with no wheezing or areas of consolidation. Abdomen: Bowel sounds are active, no hepatosplenomegaly.    Imaging: No results found.  Assessment & Plan: 1.  Wellness examination -Labs today.  Follow-up yearly. -She is up-to-date on health maintenance.  2.  Seasonal allergy - we will add Zyrtec.  3.  Hyperlipidemia -Recheck labs today.  Refilled medication.  4.  Hypertension -Refilled medications.  Follow-up in about 4 to 6 months.      Procedures: No procedures performed  No notes on file     PMFS History: Patient Active Problem List   Diagnosis Date Noted  . Pharyngeal dysphagia 04/04/2020  . 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 Tonsil asymmetry 11/03/2019  . Abnormal chest x-ray 08/12/2019  . Encounter for health maintenance examination in adult 08/12/2019  . Need for influenza vaccination 08/12/2019  . Chest wall pain 06/11/2019  . Muscle strain 06/11/2019  . Abnormal EKG 06/11/2019  . Murmur 09/05/2018  . Aortic atherosclerosis ([place]) 08/04/2018  . Elevated ferritin 08/04/2018  . Other fatigue 07/14/2018  . Iron disorder 07/14/2018  .  Primary open angle glaucoma of both eyes, mild stage 10/14/2017  . Glaucoma 07/11/2017  . Vaccine counseling 07/11/2017  . Varicose vein of leg 07/11/2017  . Need for shingles vaccine 07/11/2017  . Hearing loss 06/27/2016  . Medicare annual wellness visit, subsequent 06/26/2016  . Osteoporosis 01/12/2016  . Dyslipidemia 06/14/2015  . Essential hypertension 06/14/2015  . Mitral valve regurgitation 06/14/2015  . Tricuspid valve insufficiency 06/14/2015  . Vitamin D deficiency 06/14/2015  . Rhinitis, allergic 06/14/2015  . Renal insufficiency 06/14/2015  . Disorders of both mitral and tricuspid valves  03/04/2015  . 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 AVNRT (AV nodal re-entry tachycardia) ([place]) 03/23/2014   Past Medical History:  Diagnosis Date  . Allergies   . Allergy   . Anxiety   . Arthritis   . Atrophic vaginitis   . Cataract    hx/o surgery, both eyes; Dr. Gershon Crane  . Complication of anesthesia   . Diverticulosis    per 2010 colonoscopy  . Dyslipidemia   . Full dentures   . GERD (gastroesophageal reflux disease)    hx/o, resolved  . Glaucoma 2016  . H/O echocardiogram 06/22/2010   normal LVEF, moderate mitral and tricuspid regurg 07/2016 [name];  mild aortic valve stenosis, left ventricular normal, EF 55%; Dr. Gwenlyn Found  06/2010  . History of cardiovascular stress test 06/22/2010   normal bruce myocadial perfusion study, 72% EF; Dr. Gwenlyn Found  . History of mammogram    benign bilat calcifications, heterogeneously dense breasts, stable mammograms 2009-2014  . 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 Marti Sleigh gyencology oncology [place]; Dr. Lorriane Shire gynecology  . Hypertension   . Lactose intolerance   . 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 Rollene Fare  . Osteoporosis   . PONV (postoperative nausea and vomiting)   . SVT (supraventricular tachycardia) ([place])    asymptomatic 2014, hx/o SVT, Dr. Rollene Fare  . Vitamin D deficiency     Family History  Family history unknown: Yes    Past Surgical History:  Procedure Laterality Date  . ABDOMINAL HYSTERECTOMY Bilateral    h/o uterine cancer; oophrectomy  . CATARACT EXTRACTION     bilat  . COLONOSCOPY  08/2016   08/2016  Dr. Benson Norway advised no repeat colonoscopy;  scattered diverticla, medium hemorrhoids 2010; Dr. Benson Norway  . JOINT REPLACEMENT    . NM MYOCAR PERF WALL MOTION    . TONSILLECTOMY Right  12/28/2019   Procedure: TONSILLECTOMY;  Surgeon: Melida Quitter, MD;  Location: [facility];  Service: ENT;  Laterality: Right;  . TOTAL HIP ARTHROPLASTY Bilateral 2008, 2010    twice left (complication on the left, once on the right; Dr. Alphonzo Severance  . TRANSTHORACIC ECHOCARDIOGRAM  02/2013   ordered for SVT; EF 55-65%; calcified MV annulus, mod MR; RSVP increased; RA mildly dilated; mod TR; PA peak pressure 75mmHg   Social History   Occupational History  . Not on file  Tobacco Use  . Smoking status: Never Smoker  . Smokeless tobacco: Never Used  Substance and Sexual Activity  . Alcohol use: No  . Drug use: No  . Sexual activity: Never    Birth control/protection: Surgical    Comment: walks 3x/wk, retired, married, 2 grandchildren

## 2020-08-16 ENCOUNTER — Telehealth: Payer: Self-pay | Admitting: Family Medicine

## 2020-08-16 LAB — COMPREHENSIVE METABOLIC PANEL
AG Ratio: 1.3 (calc) (ref 1.0–2.5)
ALT: 23 U/L (ref 6–29)
AST: 30 U/L (ref 10–35)
Albumin: 4.4 g/dL (ref 3.6–5.1)
Alkaline phosphatase (APISO): 61 U/L (ref 37–153)
BUN/Creatinine Ratio: 16 (calc) (ref 6–22)
BUN: 16 mg/dL (ref 7–25)
CO2: 29 mmol/L (ref 20–32)
Calcium: 9.7 mg/dL (ref 8.6–10.4)
Chloride: 105 mmol/L (ref 98–110)
Creat: 0.97 mg/dL — ABNORMAL HIGH (ref 0.60–0.88)
Globulin: 3.3 g/dL (calc) (ref 1.9–3.7)
Glucose, Bld: 80 mg/dL (ref 65–99)
Potassium: 4.7 mmol/L (ref 3.5–5.3)
Sodium: 142 mmol/L (ref 135–146)
Total Bilirubin: 0.9 mg/dL (ref 0.2–1.2)
Total Protein: 7.7 g/dL (ref 6.1–8.1)

## 2020-08-16 LAB — CBC WITH DIFFERENTIAL/PLATELET
Absolute Monocytes: 524 cells/uL (ref 200–950)
Basophils Absolute: 19 cells/uL (ref 0–200)
Basophils Relative: 0.5 %
Eosinophils Absolute: 91 cells/uL (ref 15–500)
Eosinophils Relative: 2.4 %
HCT: 43.5 % (ref 35.0–45.0)
Hemoglobin: 13.8 g/dL (ref 11.7–15.5)
Lymphs Abs: 1478 cells/uL (ref 850–3900)
MCH: 28.6 pg (ref 27.0–33.0)
MCHC: 31.7 g/dL — ABNORMAL LOW (ref 32.0–36.0)
MCV: 90.2 fL (ref 80.0–100.0)
MPV: 10.9 fL (ref 7.5–12.5)
Monocytes Relative: 13.8 %
Neutro Abs: 1687 cells/uL (ref 1500–7800)
Neutrophils Relative %: 44.4 %
Platelets: 191 10*3/uL (ref 140–400)
RBC: 4.82 10*6/uL (ref 3.80–5.10)
RDW: 13.1 % (ref 11.0–15.0)
Total Lymphocyte: 38.9 %
WBC: 3.8 10*3/uL (ref 3.8–10.8)

## 2020-08-16 LAB — IRON,TIBC AND FERRITIN PANEL
%SAT: 29 % (calc) (ref 16–45)
Ferritin: 169 ng/mL (ref 16–288)
Iron: 98 ug/dL (ref 45–160)
TIBC: 340 mcg/dL (calc) (ref 250–450)

## 2020-08-16 LAB — LIPID PANEL
Cholesterol: 172 mg/dL (ref <200)
HDL: 56 mg/dL (ref >=50)
LDL Cholesterol (Calc): 90 mg/dL (calc)
Non-HDL Cholesterol (Calc): 116 mg/dL (calc) (ref <130)
Total CHOL/HDL Ratio: 3.1 (calc) (ref <5.0)
Triglycerides: 161 mg/dL — ABNORMAL HIGH (ref <150)

## 2020-08-16 LAB — HIGH SENSITIVITY CRP: hs-CRP: 0.9 mg/L

## 2020-08-16 LAB — VITAMIN D 25 HYDROXY (VIT D DEFICIENCY, FRACTURES): Vit D, 25-Hydroxy: 21 ng/mL — ABNORMAL LOW (ref 30–100)

## 2020-08-16 NOTE — Telephone Encounter (Signed)
Labs show:  Vitamin D is low at 21 (goal is 50-80).  I recommend taking vitamin D3 at 5,000 IU daily long-term.  Will recheck in 6 monts.  Triglycerides are slightly elevated at 161.  Be sure to minimize intake of processed carbohydrates and sweets.  Kidney function/creatinine is slightly abnormal, but has improved.  Will recheck in 6 months.  All others look good.

## 2020-08-25 ENCOUNTER — Telehealth: Payer: Self-pay

## 2020-08-25 NOTE — Telephone Encounter (Signed)
Patient called about blood work she hasn't heard back about her results she would like message to be left on her voicemail she doesn't use mychart. Call back:[phone]

## 2020-08-25 NOTE — Telephone Encounter (Signed)
Left voice mail to call back regarding results.

## 2020-08-26 NOTE — Telephone Encounter (Signed)
Left a 2nd voice message to call me back regarding results (too much to leave on machine + her name is not mentioned on her incoming message).

## 2020-08-26 NOTE — Telephone Encounter (Signed)
I spoke with the patient and advised her on her lab results from 08/15/20 and instructions from Dr. Junius Roads. She has already gotten the vitamin D3 5,000 iu and has started this, one daily.

## 2020-09-05 DIAGNOSIS — H401132 Primary open-angle glaucoma, bilateral, moderate stage: Secondary | ICD-10-CM | POA: Diagnosis not present

## 2020-09-05 DIAGNOSIS — Z961 Presence of intraocular lens: Secondary | ICD-10-CM | POA: Diagnosis not present

## 2020-09-13 ENCOUNTER — Telehealth: Payer: Self-pay | Admitting: Internal Medicine

## 2020-09-13 NOTE — Telephone Encounter (Signed)
lvm for patient to return call to get follow up scheduled with [facility] from recall list 

## 2020-10-31 DIAGNOSIS — C099 Malignant neoplasm of tonsil, unspecified: Secondary | ICD-10-CM | POA: Diagnosis not present

## 2020-10-31 DIAGNOSIS — Z9089 Acquired absence of other organs: Secondary | ICD-10-CM | POA: Diagnosis not present

## 2020-10-31 DIAGNOSIS — Z85818 Personal history of malignant neoplasm of other sites of lip, oral cavity, and pharynx: Secondary | ICD-10-CM | POA: Diagnosis not present

## 2020-10-31 DIAGNOSIS — Z08 Encounter for follow-up examination after completed treatment for malignant neoplasm: Secondary | ICD-10-CM | POA: Diagnosis not present

## 2020-11-17 ENCOUNTER — Encounter: Payer: Self-pay | Admitting: Internal Medicine

## 2020-11-17 ENCOUNTER — Other Ambulatory Visit: Payer: Self-pay

## 2020-11-17 ENCOUNTER — Ambulatory Visit: Payer: Medicare PPO | Admitting: Internal Medicine

## 2020-11-17 VITALS — BP 164/82 | HR 68 | Ht 61.0 in | Wt 143.0 lb

## 2020-11-17 DIAGNOSIS — E785 Hyperlipidemia, unspecified: Secondary | ICD-10-CM

## 2020-11-17 DIAGNOSIS — R079 Chest pain, unspecified: Secondary | ICD-10-CM

## 2020-11-17 DIAGNOSIS — I471 Supraventricular tachycardia, unspecified: Secondary | ICD-10-CM

## 2020-11-17 DIAGNOSIS — I10 Essential (primary) hypertension: Secondary | ICD-10-CM | POA: Diagnosis not present

## 2020-11-17 MED ORDER — LISINOPRIL 20 MG PO TABS: 20.0000 mg | ORAL_TABLET | Freq: Every day | ORAL | 3 refills | Status: DC

## 2020-11-17 NOTE — Patient Instructions (Signed)
Medication Instructions:  INCREASE lisinopril to 20mg  daily *If you need a refill on your cardiac medications before your next appointment, please call your pharmacy*   Follow-Up: At [facility], you and your health needs are our priority.  As part of our continuing mission to provide you with exceptional heart care, we have created designated Provider Care Teams.  These Care Teams include your primary Cardiologist (physician) and Advanced Practice Providers (APPs -  Physician Assistants and Nurse Practitioners) who all work together to provide you with the care you need, when you need it.  We recommend signing up for the patient portal called "MyChart".  Sign up information is provided on this After Visit Summary.  MyChart is used to connect with patients for Virtual Visits (Telemedicine).  Patients are able to view lab/test results, encounter notes, upcoming appointments, etc.  Non-urgent messages can be sent to your provider as well.   To learn more about what you can do with MyChart, go to [email or web address].    Your next appointment:   2 month(s)  The format for your next appointment:   In Person  Provider:   You may see Pixie Casino, MD or one of the following Advanced Practice Providers on your designated Care Team:    Almyra Deforest, PA-C  Fabian Sharp, PA-C or   Roby Lofts, [place]    Other Instructions

## 2020-11-17 NOTE — Progress Notes (Signed)
OFFICE NOTE  Chief Complaint:  No complaints  Primary Care Physician: Eunice Blase, MD  HPI:  [name] is a pleasant 86 year old female previously followed by Dr. Rollene Fare. She's here today to establish cardiac care with me. She has history of hypertension, remote bilateral hip placements and redo hip surgery for failed prosthesis in 2011. 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 She was previously on flecainide but has been taken off of that. She's had no recurrence of her palpitations on beta blockers. She had a negative Myoview for ischemia in 2011 an echocardiogram in 2014 which showed normal systolic function and moderate mitral regurgitation.  She has no complaints today.  The pleasure see [name] back in the office today. She is without any complaints. She continues to be active and denies any worsening shortness of breath or chest pain. She's had no further recurrent palpitations. Heart rate seems well controlled on atenolol. Recently she's had some issues with lower blood pressure and her lisinopril HCTZ was changed to lisinopril. This is associated with a small increase in blood pressure to the normal range. She now feels well again. Should be noted she had an echocardiogram in 2014 which showed moderate MR and moderate TR.  08/09/2016  [name] returns today for follow-up. She continues to be without complaints. She walks regularly and has no shortness of breath or worsening chest pain. Blood pressure is well-controlled today. She just had a physical which was unremarkable. Cholesterol has been well controlled -recent total cholesterol 146, HDL 42, LDL 74 and triglycerides 149. She just had a repeat echocardiogram which shows persistent moderate MR and TR with an EF of 65%. This appears to be unchanged compared to her prior echo in 2014. There is very mild pulmonary hypertension but she is asymptomatic with  this.  08/13/2017  [name] was seen today follow-up. She denies any recurrent SVT. There is no chest pain or worsening shortness of breath. She says she walks regularly. She had a recent cholesterol profile which is very stable showing total cholesterol is above 150 and LDL 79. She's had normal LV function by echo within the past several years and moderate MR and TR. She is asymptomatic. I don't anticipate any valve surgery that will be necessary.  09/05/2018  [name] returns today for routine follow-up.  She denies any recurrent SVT.  She denies chest pain or shortness of breath.  Cholesterol is been well controlled with LDL 72.  Blood pressure is at goal.  She was noted to have moderate MR and TR by echo in 2017.  Her murmurs are stable.  She denies any new heart failure symptoms.  11/17/2020  [name] is seen today for follow-up.  She was seen twice over the summer for chest pain by our advanced practice providers.  An echo and stress test were ordered.  The echo showed normal systolic function and her stress test showed some mild reversible ischemia but otherwise was thought to be a low risk stress test.  Medical therapy was recommended.  Her chest pain was felt to be atypical.  Subsequently however she was found to have an enlarged tonsil and ultimately was noted to have cancer.  This required significant resection and robotic surgery done at [facility].  Ultimately then she had to relearn to swallow.  She does report her chest pain symptoms have resolved.  PMHx:  Past Medical History:  Diagnosis  Date  . Allergies   . Allergy   . Anxiety   . Arthritis   . Atrophic vaginitis   . Cataract    hx/o surgery, both eyes; Dr. Gershon Crane  . Complication of anesthesia   . Diverticulosis    per 2010 colonoscopy  . Dyslipidemia   . Full dentures   . GERD (gastroesophageal reflux disease)    hx/o, resolved  . Glaucoma 2016  . H/O echocardiogram 06/22/2010   normal LVEF, moderate mitral  and tricuspid regurg 07/2016 [name];  mild aortic valve stenosis, left ventricular normal, EF 55%; Dr. Gwenlyn Found  06/2010  . History of cardiovascular stress test 06/22/2010   normal bruce myocadial perfusion study, 72% EF; Dr. Gwenlyn Found  . History of mammogram    benign bilat calcifications, heterogeneously dense breasts, stable mammograms 2009-2014  . 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 Marti Sleigh gyencology oncology [place]; Dr. Lorriane Shire gynecology  . Hypertension   . Lactose intolerance   . 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 Rollene Fare  . Osteoporosis   . PONV (postoperative nausea and vomiting)   . SVT (supraventricular tachycardia) ([place])    asymptomatic 2014, hx/o SVT, Dr. Rollene Fare  . Vitamin D deficiency     Past Surgical History:  Procedure Laterality Date  . ABDOMINAL HYSTERECTOMY Bilateral    h/o uterine cancer; oophrectomy  . CATARACT EXTRACTION     bilat  . COLONOSCOPY  08/2016   08/2016  Dr. Benson Norway advised no repeat colonoscopy;  scattered diverticla, medium hemorrhoids 2010; Dr. Benson Norway  . JOINT REPLACEMENT    . NM MYOCAR PERF WALL MOTION    . TONSILLECTOMY Right 12/28/2019   Procedure: TONSILLECTOMY;  Surgeon: Melida Quitter, MD;  Location: [facility];  Service: ENT;  Laterality: Right;  . TOTAL HIP ARTHROPLASTY Bilateral 2008, 2010    twice left (complication on the left, once on the right; Dr. Alphonzo Severance  . TRANSTHORACIC ECHOCARDIOGRAM  02/2013   ordered for SVT; EF 55-65%; calcified MV annulus, mod MR; RSVP increased; RA mildly dilated; mod TR; PA peak pressure 10mmHg    FAMHx:  Family History  Family history unknown: Yes    SOCHx:   reports that she has never smoked. She has never used smokeless tobacco. She reports that  she does not drink alcohol and does not use drugs.  ALLERGIES:  Allergies  Allergen Reactions  . Other Nausea Only    "medicines with steroids in it make me nauseous"    ROS: Pertinent items noted in HPI and remainder of comprehensive ROS otherwise negative.  HOME MEDS: Current Outpatient Medications  Medication Sig Dispense Refill  . alendronate (FOSAMAX) 70 MG tablet TAKE 1 TABLET BY MOUTH EVERY 7 DAYS WITH A FULL GLASS OF WATER AN EMPTY STOMACH 12 tablet 3  . aspirin 81 MG EC tablet Take by mouth.    Marland Kitchen atenolol (TENORMIN) 50 MG tablet Take 1 tablet (50 mg total) by mouth daily. 90 tablet 3  . brimonidine (ALPHAGAN P) 0.1 % SOLN Place 1 drop into both eyes 2 times daily.    . calcium carbonate (CALCIUM 600) 600 MG TABS tablet Take 1 tablet (600 mg total) by mouth 2 (two) times daily with a meal. 180 tablet 3  . Cholecalciferol (D3 HIGH  POTENCY) 25 MCG (1000 UT) capsule Take 1 capsule (1,000 Units total) by mouth daily. 90 capsule 3  . COMBIGAN 0.2-0.5 % ophthalmic solution     . HYDROcodone-acetaminophen (HYCET) 7.5-325 mg/15 ml solution Take 10-15 mLs by mouth 4 (four) times daily as needed for moderate pain. 473 mL 0  . latanoprost (XALATAN) 0.005 % ophthalmic solution Place 1 drop into both eyes.    Marland Kitchen lisinopril (ZESTRIL) 10 MG tablet Take 1 tablet (10 mg total) by mouth daily. 90 tablet 3  . montelukast (SINGULAIR) 10 MG tablet Take 1 tablet (10 mg total) by mouth daily. 30 tablet 11  . simvastatin (ZOCOR) 40 MG tablet TAKE 1 TABLET BY MOUTH EVERYDAY AT BEDTIME 90 tablet 3  . cetirizine (ZYRTEC) 10 MG tablet Take 1 tablet (10 mg total) by mouth daily as needed for allergies. (Patient not taking: Reported on 11/17/2020) 90 tablet 3  . fluticasone (FLONASE) 50 MCG/ACT nasal spray 1 spray by Each Nare route daily for 30 days.     No current facility-administered medications for this visit.    LABS/IMAGING: No results found for this or any previous visit (from the past 48  hour(s)). No results found.  VITALS: BP (!) 164/82   Pulse 68   Ht 5\' 1"  (1.549 m)   Wt 143 lb (64.9 kg)   SpO2 99%   BMI 27.02 kg/m   EXAM: General appearance: alert and no distress Neck: no carotid bruit, no JVD and thyroid not enlarged, symmetric, no tenderness/mass/nodules Lungs: clear to auscultation bilaterally Heart: regular rate and rhythm, S1, S2 normal, systolic murmur: holosystolic 3/6, blowing at apex and 2nd systolic murmur: early systolic 2/6, crescendo at 2nd right intercostal space Abdomen: soft, non-tender; bowel sounds normal; no masses,  no organomegaly Extremities: extremities normal, atraumatic, no cyanosis or edema Pulses: 2+ and symmetric Skin: Skin color, texture, turgor normal. No rashes or lesions Neurologic: Grossly normal Psych: Mood, affect normal  EKG: Sinus rhythm with PACs at 68-personally reviewed  ASSESSMENT: 1. LGL syndrome 2. History of AVNRT 3. Hypertension 4. Dyslipidemia 5. Moderate MR and moderate TR (2017), LVEF 65%, however improved with EF greater than 00%, grade 2 diastolic dysfunction and mild pulmonary hypertension with RVSP 40 mmHg (06/2019)  PLAN: 1.   [name] denies any recurrent chest pain.  She has had no episodes of palpitations or tachycardia.  Echo in 2017 showed moderate MR and TR, however her last echo showed normal EF with mild pulmonary hypertension and grade 2 diastolic dysfunction.  Blood pressure continues to be an issue.  She still not adequately controlled.  I recommended further increase in her lisinopril today from 10 to 20 mg daily.  She may need further increases.  We will schedule her for a follow-up in a couple months with our APP's for further adjustment if necessary.  Follow-up with me annually or sooner as necessary.  Pixie Casino, MD, [facility], [place] Director of the Advanced Lipid Disorders &  Cardiovascular Risk [facility] Diplomate of the American Board  of Clinical Lipidology Attending Cardiologist  Direct Dial: [phone]  Fax: [phone]  Website:  [email or web address] 11/17/2020, 11:52 AM

## 2020-11-21 ENCOUNTER — Ambulatory Visit: Payer: Medicare PPO | Admitting: Family Medicine

## 2020-11-21 ENCOUNTER — Other Ambulatory Visit: Payer: Self-pay

## 2020-11-21 ENCOUNTER — Encounter: Payer: Self-pay | Admitting: Family Medicine

## 2020-11-21 VITALS — BP 147/72 | HR 84 | Ht 61.0 in | Wt 141.0 lb

## 2020-11-21 DIAGNOSIS — I10 Essential (primary) hypertension: Secondary | ICD-10-CM | POA: Diagnosis not present

## 2020-11-21 NOTE — Progress Notes (Signed)
Office Visit Note   Patient: [name]           Date of Birth: 01-17-1933           MRN: [id number] Visit Date: 11/21/2020 Requested by: Eunice Blase, MD [address] [place],  [place] [address] PCP: Eunice Blase, MD  Subjective: Chief Complaint  Patient presents with  . Blood Pressure Check    has been elevated    HPI:   She is here for blood pressure follow-up.  She recently saw [name] and was placed on 20 mg lisinopril instead of 10 mg.  No side effects with this new dosage.  She is not having any further chest pain.  She is not checking blood pressure on her own.  She is down to figure out what has caused her blood pressure to go up, no changes in her diet.  She has been under some stress recently.  Overall she sleeps adequately.  She does note that her energy is not quite like it used to be since undergoing throat cancer treatment.               ROS:   All other systems were reviewed and are negative.  Objective: Vital Signs: BP (!) 147/72   Pulse 84   Ht 5\' 1"  (1.549 m)   Wt 141 lb (64 kg)   BMI 26.64 kg/m   Physical Exam:  General:  Alert and oriented, in no acute distress. Pulm:  Breathing unlabored. Psy:  Normal mood, congruent affect.  CV: Regular rate and rhythm without murmurs, rubs, or gallops.  1+ bilateral lower extremity peripheral edema.  2+ radial and posterior tibial pulses. Lungs: Clear to auscultation throughout with no wheezing or areas of consolidation.    Imaging: No results found.  Assessment & Plan: 1.  Hypertension, under better control. -Elected to continue her current dosage and refer her to [facility] for conditioning exercises. -Follow-up in a month for recheck.     Procedures: No procedures performed  No notes on file     PMFS History: Patient Active Problem List   Diagnosis Date Noted  . Pharyngeal dysphagia 04/04/2020  . 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 Tonsil asymmetry 11/03/2019  . Abnormal  chest x-ray 08/12/2019  . Encounter for health maintenance examination in adult 08/12/2019  . Need for influenza vaccination 08/12/2019  . Chest wall pain 06/11/2019  . Muscle strain 06/11/2019  . Abnormal EKG 06/11/2019  . Murmur 09/05/2018  . Aortic atherosclerosis ([place]) 08/04/2018  . Elevated ferritin 08/04/2018  . Other fatigue 07/14/2018  . Iron disorder 07/14/2018  . Primary open angle glaucoma of both eyes, mild stage 10/14/2017  . Glaucoma 07/11/2017  . Vaccine counseling 07/11/2017  . Varicose vein of leg 07/11/2017  . Need for shingles vaccine 07/11/2017  . Hearing loss 06/27/2016  . Medicare annual wellness visit, subsequent 06/26/2016  . Osteoporosis 01/12/2016  . Dyslipidemia 06/14/2015  . Essential hypertension 06/14/2015  . Mitral valve regurgitation 06/14/2015  . Tricuspid valve insufficiency 06/14/2015  . Vitamin D deficiency 06/14/2015  . Rhinitis, allergic 06/14/2015  . Renal insufficiency 06/14/2015  . 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 AVNRT (AV nodal re-entry tachycardia) ([place]) 03/23/2014   Past Medical History:  Diagnosis Date  . Allergies   . Allergy   . Anxiety   . Arthritis   . Atrophic vaginitis   . Cataract    hx/o surgery,  both eyes; Dr. Gershon Crane  . Complication of anesthesia   . Diverticulosis    per 2010 colonoscopy  . Dyslipidemia   . Full dentures   . GERD (gastroesophageal reflux disease)    hx/o, resolved  . Glaucoma 2016  . H/O echocardiogram 06/22/2010   normal LVEF, moderate mitral and tricuspid regurg 07/2016 [name];  mild aortic valve stenosis, left ventricular normal, EF 55%; Dr. Gwenlyn Found  06/2010  . History of cardiovascular stress test 06/22/2010   normal bruce myocadial perfusion study, 72% EF; Dr. Gwenlyn Found  . History of mammogram    benign bilat calcifications, heterogeneously dense breasts, stable mammograms 2009-2014  . History of uterine cancer 12/2002   s/p  TAH and BSO, pelvic and periarotic lymphadenectomy 12/2002, no adjuvant therapy; stage 1b carcinosarcoma of uterus, completed 5 years of f/u as of 2008; Dr. Marti Sleigh gyencology oncology [place]; Dr. Lorriane Shire gynecology  . Hypertension   . Lactose intolerance   . 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 Rollene Fare  . Osteoporosis   . PONV (postoperative nausea and vomiting)   . SVT (supraventricular tachycardia) ([place])    asymptomatic 2014, hx/o SVT, Dr. Rollene Fare  . Vitamin D deficiency     Family History  Family history unknown: Yes    Past Surgical History:  Procedure Laterality Date  . ABDOMINAL HYSTERECTOMY Bilateral    h/o uterine cancer; oophrectomy  . CATARACT EXTRACTION     bilat  . COLONOSCOPY  08/2016   08/2016  Dr. Benson Norway advised no repeat colonoscopy;  scattered diverticla, medium hemorrhoids 2010; Dr. Benson Norway  . JOINT REPLACEMENT    . NM MYOCAR PERF WALL MOTION    . TONSILLECTOMY Right 12/28/2019   Procedure: TONSILLECTOMY;  Surgeon: Melida Quitter, MD;  Location: [facility];  Service: ENT;  Laterality: Right;  . TOTAL HIP ARTHROPLASTY Bilateral 2008, 2010    twice left (complication on the left, once on the right; Dr. Alphonzo Severance  . TRANSTHORACIC ECHOCARDIOGRAM  02/2013   ordered for SVT; EF 55-65%; calcified MV annulus, mod MR; RSVP increased; RA mildly dilated; mod TR; PA peak pressure 87mmHg   Social History   Occupational History  . Not on file  Tobacco Use  . Smoking status: Never Smoker  . Smokeless tobacco: Never Used  Substance and Sexual Activity  . Alcohol use: No  . Drug use: No  . Sexual activity: Never    Birth control/protection: Surgical    Comment: walks 3x/wk, retired, married, 2 grandchildren

## 2020-11-24 DIAGNOSIS — B351 Tinea unguium: Secondary | ICD-10-CM | POA: Diagnosis not present

## 2020-11-24 DIAGNOSIS — L989 Disorder of the skin and subcutaneous tissue, unspecified: Secondary | ICD-10-CM | POA: Diagnosis not present

## 2020-11-24 DIAGNOSIS — D485 Neoplasm of uncertain behavior of skin: Secondary | ICD-10-CM | POA: Diagnosis not present

## 2020-11-30 DIAGNOSIS — L988 Other specified disorders of the skin and subcutaneous tissue: Secondary | ICD-10-CM | POA: Diagnosis not present

## 2020-12-05 DIAGNOSIS — H401131 Primary open-angle glaucoma, bilateral, mild stage: Secondary | ICD-10-CM | POA: Diagnosis not present

## 2020-12-07 DIAGNOSIS — M6281 Muscle weakness (generalized): Secondary | ICD-10-CM | POA: Diagnosis not present

## 2020-12-07 DIAGNOSIS — Z723 Lack of physical exercise: Secondary | ICD-10-CM | POA: Diagnosis not present

## 2020-12-19 ENCOUNTER — Ambulatory Visit: Payer: Medicare PPO | Admitting: Family Medicine

## 2020-12-26 ENCOUNTER — Other Ambulatory Visit: Payer: Medicare PPO

## 2020-12-29 DIAGNOSIS — M6281 Muscle weakness (generalized): Secondary | ICD-10-CM | POA: Diagnosis not present

## 2020-12-29 DIAGNOSIS — Z723 Lack of physical exercise: Secondary | ICD-10-CM | POA: Diagnosis not present

## 2021-01-10 ENCOUNTER — Encounter: Payer: Self-pay | Admitting: Family Medicine

## 2021-01-10 ENCOUNTER — Other Ambulatory Visit: Payer: Self-pay

## 2021-01-10 ENCOUNTER — Ambulatory Visit: Payer: Medicare PPO | Admitting: Family Medicine

## 2021-01-10 VITALS — BP 156/72 | HR 73 | Ht 61.0 in | Wt 146.0 lb

## 2021-01-10 DIAGNOSIS — I10 Essential (primary) hypertension: Secondary | ICD-10-CM | POA: Diagnosis not present

## 2021-01-10 NOTE — Progress Notes (Signed)
Office Visit Note   Patient: [name]           Date of Birth: 02/20/1933           MRN: [id number] Visit Date: 01/10/2021 Requested by: Eunice Blase, MD [address] [place],  [place] [address] PCP: Eunice Blase, MD  Subjective: Chief Complaint  Patient presents with  . Blood Pressure Check    HPI: She is here for follow-up hypertension.  No change in her medication regimen which includes lisinopril 20 mg daily and atenolol 50 mg daily.  Feels great, energy level is good, blood pressure readings at home have been normal.  She is eating well most of the time, and exercising at the gym.                ROS:   All other systems were reviewed and are negative.  Objective: Vital Signs: BP (!) 156/72   Pulse 73   Ht 5\' 1"  (1.549 m)   Wt 146 lb (66.2 kg)   BMI 27.59 kg/m   Physical Exam:  General:  Alert and oriented, in no acute distress. Pulm:  Breathing unlabored. Psy:  Normal mood, congruent affect.  CV: Regular rate and rhythm without murmurs, rubs, or gallops.  No peripheral edema.  2+ radial and posterior tibial pulses. Lungs: Clear to auscultation throughout with no wheezing or areas of consolidation.    Imaging: No results found.  Assessment & Plan: 1.  Hypertension, under good control at home.  Question whitecoat hypertension. -She will bring her blood pressure cuff in for calibration.  Presuming it is accurate, we will not make any changes in her dosage today.  I will been seeing her back in 3 to 6 months for recheck.     Procedures: No procedures performed        PMFS History: Patient Active Problem List   Diagnosis Date Noted  . Pharyngeal dysphagia 04/04/2020  . 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 Tonsil asymmetry 11/03/2019  . Abnormal chest x-ray 08/12/2019  . Encounter for health maintenance examination in adult 08/12/2019  . Need for influenza vaccination 08/12/2019  . Chest wall pain 06/11/2019  . Muscle strain 06/11/2019  .  Abnormal EKG 06/11/2019  . Murmur 09/05/2018  . Aortic atherosclerosis ([place]) 08/04/2018  . Elevated ferritin 08/04/2018  . Other fatigue 07/14/2018  . Iron disorder 07/14/2018  . Primary open angle glaucoma of both eyes, mild stage 10/14/2017  . Glaucoma 07/11/2017  . Vaccine counseling 07/11/2017  . Varicose vein of leg 07/11/2017  . Need for shingles vaccine 07/11/2017  . Hearing loss 06/27/2016  . Medicare annual wellness visit, subsequent 06/26/2016  . Osteoporosis 01/12/2016  . Dyslipidemia 06/14/2015  . Essential hypertension 06/14/2015  . Mitral valve regurgitation 06/14/2015  . Tricuspid valve insufficiency 06/14/2015  . Vitamin D deficiency 06/14/2015  . Rhinitis, allergic 06/14/2015  . Renal insufficiency 06/14/2015  . 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 AVNRT (AV nodal re-entry tachycardia) ([place]) 03/23/2014   Past Medical History:  Diagnosis Date  . Allergies   . Allergy   . Anxiety   . Arthritis   . Atrophic vaginitis   . Cataract    hx/o surgery, both eyes; Dr. Gershon Crane  . Complication of anesthesia   . Diverticulosis    per 2010 colonoscopy  . Dyslipidemia   . Full dentures   . GERD (gastroesophageal reflux disease)    hx/o, resolved  .  Glaucoma 2016  . H/O echocardiogram 06/22/2010   normal LVEF, moderate mitral and tricuspid regurg 07/2016 [name];  mild aortic valve stenosis, left ventricular normal, EF 55%; Dr. Gwenlyn Found  06/2010  . History of cardiovascular stress test 06/22/2010   normal bruce myocadial perfusion study, 72% EF; Dr. Gwenlyn Found  . History of mammogram    benign bilat calcifications, heterogeneously dense breasts, stable mammograms 2009-2014  . 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 Marti Sleigh gyencology oncology [place]; Dr. Lorriane Shire gynecology  . Hypertension   . Lactose intolerance   . 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 Rollene Fare  . Osteoporosis   . PONV (postoperative nausea and vomiting)   . SVT (supraventricular tachycardia) ([place])    asymptomatic 2014, hx/o SVT, Dr. Rollene Fare  . Vitamin D deficiency     Family History  Family history unknown: Yes    Past Surgical History:  Procedure Laterality Date  . ABDOMINAL HYSTERECTOMY Bilateral    h/o uterine cancer; oophrectomy  . CATARACT EXTRACTION     bilat  . COLONOSCOPY  08/2016   08/2016  Dr. Benson Norway advised no repeat colonoscopy;  scattered diverticla, medium hemorrhoids 2010; Dr. Benson Norway  . JOINT REPLACEMENT    . NM MYOCAR PERF WALL MOTION    . TONSILLECTOMY Right 12/28/2019   Procedure: TONSILLECTOMY;  Surgeon: Melida Quitter, MD;  Location: [facility];  Service: ENT;  Laterality: Right;  . TOTAL HIP ARTHROPLASTY Bilateral 2008, 2010    twice left (complication on the left, once on the right; Dr. Alphonzo Severance  . TRANSTHORACIC ECHOCARDIOGRAM  02/2013   ordered for SVT; EF 55-65%; calcified MV annulus, mod MR; RSVP increased; RA mildly dilated; mod TR; PA peak pressure 81mmHg   Social History   Occupational History  . Not on file  Tobacco Use  . Smoking status: Never Smoker  . Smokeless tobacco: Never Used  Substance and Sexual Activity  . Alcohol use: No  . Drug use: No  . Sexual activity: Never    Birth control/protection: Surgical    Comment: walks 3x/wk, retired, married, 2 grandchildren

## 2021-01-25 ENCOUNTER — Other Ambulatory Visit: Payer: Self-pay

## 2021-01-25 ENCOUNTER — Encounter: Payer: Self-pay | Admitting: Internal Medicine

## 2021-01-25 ENCOUNTER — Ambulatory Visit: Payer: Medicare PPO | Admitting: Internal Medicine

## 2021-01-25 VITALS — BP 138/74 | HR 78 | Ht 61.0 in | Wt 143.0 lb

## 2021-01-25 DIAGNOSIS — I10 Essential (primary) hypertension: Secondary | ICD-10-CM

## 2021-01-25 DIAGNOSIS — I471 Supraventricular tachycardia: Secondary | ICD-10-CM

## 2021-01-25 DIAGNOSIS — E785 Hyperlipidemia, unspecified: Secondary | ICD-10-CM

## 2021-01-25 NOTE — Progress Notes (Signed)
OFFICE NOTE  Chief Complaint:  No complaints  Primary Care Physician: Eunice Blase, MD  HPI:  [name] is a pleasant 87 year old female previously followed by Dr. Rollene Fare. She's here today to establish cardiac care with me. She has history of hypertension, remote bilateral hip placements and redo hip surgery for failed prosthesis in 2011. 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 She was previously on flecainide but has been taken off of that. She's had no recurrence of her palpitations on beta blockers. She had a negative Myoview for ischemia in 2011 an echocardiogram in 2014 which showed normal systolic function and moderate mitral regurgitation.  She has no complaints today.  The pleasure see Ms. [name] back in the office today. She is without any complaints. She continues to be active and denies any worsening shortness of breath or chest pain. She's had no further recurrent palpitations. Heart rate seems well controlled on atenolol. Recently she's had some issues with lower blood pressure and her lisinopril HCTZ was changed to lisinopril. This is associated with a small increase in blood pressure to the normal range. She now feels well again. Should be noted she had an echocardiogram in 2014 which showed moderate MR and moderate TR.  08/09/2016  [name] returns today for follow-up. She continues to be without complaints. She walks regularly and has no shortness of breath or worsening chest pain. Blood pressure is well-controlled today. She just had a physical which was unremarkable. Cholesterol has been well controlled -recent total cholesterol 146, HDL 42, LDL 74 and triglycerides 149. She just had a repeat echocardiogram which shows persistent moderate MR and TR with an EF of 65%. This appears to be unchanged compared to her prior echo in 2014. There is very mild pulmonary hypertension but she is asymptomatic with  this.  08/13/2017  [name] was seen today follow-up. She denies any recurrent SVT. There is no chest pain or worsening shortness of breath. She says she walks regularly. She had a recent cholesterol profile which is very stable showing total cholesterol is above 150 and LDL 79. She's had normal LV function by echo within the past several years and moderate MR and TR. She is asymptomatic. I don't anticipate any valve surgery that will be necessary.  09/05/2018  [name] returns today for routine follow-up.  She denies any recurrent SVT.  She denies chest pain or shortness of breath.  Cholesterol is been well controlled with LDL 72.  Blood pressure is at goal.  She was noted to have moderate MR and TR by echo in 2017.  Her murmurs are stable.  She denies any new heart failure symptoms.  11/17/2020  [name] is seen today for follow-up.  She was seen twice over the summer for chest pain by our advanced practice providers.  An echo and stress test were ordered.  The echo showed normal systolic function and her stress test showed some mild reversible ischemia but otherwise was thought to be a low risk stress test.  Medical therapy was recommended.  Her chest pain was felt to be atypical.  Subsequently however she was found to have an enlarged tonsil and ultimately was noted to have cancer.  This required significant resection and robotic surgery done at [facility].  Ultimately then she had to relearn to swallow.  She does report her chest pain symptoms have resolved.  01/25/2021  [name] returns today for  follow-up.  Overall she says she denies any chest pain or shortness of breath.  Her blood pressure is finally better controlled.  It was a little up today 138/74 but in general it is in the 120s to low 355 systolic.  She recently saw her primary care provider who also noted she was doing well with that.  She is had no palpitations or worsening shortness of breath.  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  PMHx:  Past Medical History:  Diagnosis Date  . Allergies   . Allergy   . Anxiety   . Arthritis   . Atrophic vaginitis   . Cataract    hx/o surgery, both eyes; Dr. Gershon Crane  . Complication of anesthesia   . Diverticulosis    per 2010 colonoscopy  . Dyslipidemia   . Full dentures   . GERD (gastroesophageal reflux disease)    hx/o, resolved  . Glaucoma 2016  . H/O echocardiogram 06/22/2010   normal LVEF, moderate mitral and tricuspid regurg 07/2016 [name];  mild aortic valve stenosis, left ventricular normal, EF 55%; Dr. Gwenlyn Found  06/2010  . History of cardiovascular stress test 06/22/2010   normal bruce myocadial perfusion study, 72% EF; Dr. Gwenlyn Found  . History of mammogram    benign bilat calcifications, heterogeneously dense breasts, stable mammograms 2009-2014  . 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 Marti Sleigh gyencology oncology [place]; Dr. Lorriane Shire gynecology  . Hypertension   . Lactose intolerance   . 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 Rollene Fare  . Osteoporosis   . PONV (postoperative nausea and vomiting)   . SVT (supraventricular tachycardia) ([place])    asymptomatic 2014, hx/o SVT, Dr. Rollene Fare  . Vitamin D deficiency     Past Surgical History:  Procedure Laterality Date  . ABDOMINAL HYSTERECTOMY Bilateral    h/o uterine cancer; oophrectomy  . CATARACT EXTRACTION     bilat  . COLONOSCOPY  08/2016   08/2016  Dr. Benson Norway advised no repeat colonoscopy;  scattered diverticla, medium hemorrhoids 2010; Dr. Benson Norway  . JOINT REPLACEMENT    . NM MYOCAR PERF WALL MOTION    . TONSILLECTOMY Right 12/28/2019   Procedure: TONSILLECTOMY;  Surgeon: Melida Quitter, MD;  Location:  [facility];  Service: ENT;  Laterality: Right;  . TOTAL HIP ARTHROPLASTY Bilateral 2008, 2010    twice left (complication on the left, once on the right; Dr. Alphonzo Severance  . TRANSTHORACIC ECHOCARDIOGRAM  02/2013   ordered for SVT; EF 55-65%; calcified MV annulus, mod MR; RSVP increased; RA mildly dilated; mod TR; PA peak pressure 52mmHg    FAMHx:  Family History  Family history unknown: Yes    SOCHx:   reports that she has never smoked. She has never used smokeless tobacco. She reports that she does not drink alcohol and does not use drugs.  ALLERGIES:  Allergies  Allergen Reactions  . Other Nausea Only    "medicines with steroids in it make me nauseous"    ROS: Pertinent items noted in HPI and remainder of comprehensive ROS otherwise negative.  HOME MEDS: Current Outpatient Medications  Medication Sig Dispense Refill  . alendronate (FOSAMAX) 70 MG tablet TAKE 1 TABLET BY MOUTH  EVERY 7 DAYS WITH A FULL GLASS OF WATER AN EMPTY STOMACH 12 tablet 3  . aspirin 81 MG EC tablet Take by mouth.    Marland Kitchen atenolol (TENORMIN) 50 MG tablet Take 1 tablet (50 mg total) by mouth daily. 90 tablet 3  . brimonidine (ALPHAGAN P) 0.1 % SOLN Place 1 drop into both eyes 2 times daily.    . calcium carbonate (CALCIUM 600) 600 MG TABS tablet Take 1 tablet (600 mg total) by mouth 2 (two) times daily with a meal. 180 tablet 3  . cetirizine (ZYRTEC) 10 MG tablet Take 1 tablet (10 mg total) by mouth daily as needed for allergies. 90 tablet 3  . Cholecalciferol (D3 HIGH POTENCY) 25 MCG (1000 UT) capsule Take 1 capsule (1,000 Units total) by mouth daily. 90 capsule 3  . Ciclopirox 0.77 % gel Apply to nails once daily    . COMBIGAN 0.2-0.5 % ophthalmic solution     . latanoprost (XALATAN) 0.005 % ophthalmic solution Place 1 drop into both eyes.    Marland Kitchen lisinopril (ZESTRIL) 20 MG tablet Take 1 tablet (20 mg total) by mouth daily. 180 tablet 3  . montelukast (SINGULAIR) 10 MG tablet Take 1 tablet (10 mg  total) by mouth daily. 30 tablet 11  . simvastatin (ZOCOR) 40 MG tablet TAKE 1 TABLET BY MOUTH EVERYDAY AT BEDTIME 90 tablet 3   No current facility-administered medications for this visit.    LABS/IMAGING: No results found for this or any previous visit (from the past 48 hour(s)). No results found.  VITALS: BP 138/74 (BP Location: Left Arm, Patient Position: Sitting, Cuff Size: Normal)   Pulse 78   Ht 5\' 1"  (1.549 m)   Wt 143 lb (64.9 kg)   BMI 27.02 kg/m   EXAM: General appearance: alert and no distress Neck: no carotid bruit, no JVD and thyroid not enlarged, symmetric, no tenderness/mass/nodules Lungs: clear to auscultation bilaterally Heart: regular rate and rhythm, S1, S2 normal, systolic murmur: holosystolic 3/6, blowing at apex and 2nd systolic murmur: early systolic 2/6, crescendo at 2nd right intercostal space Abdomen: soft, non-tender; bowel sounds normal; no masses,  no organomegaly Extremities: extremities normal, atraumatic, no cyanosis or edema Pulses: 2+ and symmetric Skin: Skin color, texture, turgor normal. No rashes or lesions Neurologic: Grossly normal Psych: Mood, affect normal  EKG: Deferred  ASSESSMENT: 1. LGL syndrome 2. History of AVNRT 3. Hypertension 4. Dyslipidemia 5. Moderate MR and moderate TR (2017), LVEF 65%, however improved with EF greater than 27%, grade 2 diastolic dysfunction and mild pulmonary hypertension with RVSP 40 mmHg (06/2019)  PLAN: 1.   [name] has better control of her blood pressure at this point.  She denies any worsening shortness of breath, palpitations or associated symptoms.  She is continuing to improve after recent surgery for cancer.  Weight has been stable.  Follow-up with me annually or sooner as necessary.  Pixie Casino, MD, [facility], [place] Director of the Advanced Lipid Disorders &  Cardiovascular Risk Reduction Clinic Diplomate of the American Board of Clinical  Lipidology Attending Cardiologist  Direct Dial: [phone]  Fax: [phone]  Website:  [email or web address] 01/25/2021, 11:46 AM

## 2021-01-25 NOTE — Patient Instructions (Signed)
Medication Instructions:  No changes  *If you need a refill on your cardiac medications before your next appointment, please call your pharmacy*   Lab Work:  Not needed  Testing/Procedures: Not needed   Follow-Up: At [facility], you and your health needs are our priority.  As part of our continuing mission to provide you with exceptional heart care, we have created designated Provider Care Teams.  These Care Teams include your primary Cardiologist (physician) and Advanced Practice Providers (APPs -  Physician Assistants and Nurse Practitioners) who all work together to provide you with the care you need, when you need it.     Your next appointment:   12 month(s)  The format for your next appointment:   In Person  Provider:   K. Mali Hilty, MD

## 2021-02-13 ENCOUNTER — Ambulatory Visit: Payer: Medicare PPO | Admitting: Family Medicine

## 2021-02-15 ENCOUNTER — Ambulatory Visit: Payer: Medicare PPO | Admitting: Family Medicine

## 2021-02-15 DIAGNOSIS — Z888 Allergy status to other drugs, medicaments and biological substances status: Secondary | ICD-10-CM | POA: Diagnosis not present

## 2021-02-15 DIAGNOSIS — Z08 Encounter for follow-up examination after completed treatment for malignant neoplasm: Secondary | ICD-10-CM | POA: Diagnosis not present

## 2021-02-15 DIAGNOSIS — C099 Malignant neoplasm of tonsil, unspecified: Secondary | ICD-10-CM | POA: Diagnosis not present

## 2021-02-15 DIAGNOSIS — Z85818 Personal history of malignant neoplasm of other sites of lip, oral cavity, and pharynx: Secondary | ICD-10-CM | POA: Diagnosis not present

## 2021-03-06 DIAGNOSIS — H401132 Primary open-angle glaucoma, bilateral, moderate stage: Secondary | ICD-10-CM | POA: Diagnosis not present

## 2021-04-09 ENCOUNTER — Other Ambulatory Visit: Payer: Self-pay | Admitting: Family Medicine

## 2021-04-19 DIAGNOSIS — J3089 Other allergic rhinitis: Secondary | ICD-10-CM | POA: Diagnosis not present

## 2021-04-19 DIAGNOSIS — J301 Allergic rhinitis due to pollen: Secondary | ICD-10-CM | POA: Diagnosis not present

## 2021-04-19 DIAGNOSIS — T781XXD Other adverse food reactions, not elsewhere classified, subsequent encounter: Secondary | ICD-10-CM | POA: Diagnosis not present

## 2021-05-04 DIAGNOSIS — Z1231 Encounter for screening mammogram for malignant neoplasm of breast: Secondary | ICD-10-CM | POA: Diagnosis not present

## 2021-06-08 DIAGNOSIS — H401131 Primary open-angle glaucoma, bilateral, mild stage: Secondary | ICD-10-CM | POA: Diagnosis not present

## 2021-06-21 DIAGNOSIS — W57XXXA Bitten or stung by nonvenomous insect and other nonvenomous arthropods, initial encounter: Secondary | ICD-10-CM | POA: Diagnosis not present

## 2021-06-21 DIAGNOSIS — L299 Pruritus, unspecified: Secondary | ICD-10-CM | POA: Diagnosis not present

## 2021-06-21 DIAGNOSIS — S50862A Insect bite (nonvenomous) of left forearm, initial encounter: Secondary | ICD-10-CM | POA: Diagnosis not present

## 2021-07-07 DIAGNOSIS — M1990 Unspecified osteoarthritis, unspecified site: Secondary | ICD-10-CM | POA: Diagnosis not present

## 2021-07-07 DIAGNOSIS — E559 Vitamin D deficiency, unspecified: Secondary | ICD-10-CM | POA: Diagnosis not present

## 2021-07-07 DIAGNOSIS — H401130 Primary open-angle glaucoma, bilateral, stage unspecified: Secondary | ICD-10-CM | POA: Diagnosis not present

## 2021-07-07 DIAGNOSIS — E785 Hyperlipidemia, unspecified: Secondary | ICD-10-CM | POA: Diagnosis not present

## 2021-07-07 DIAGNOSIS — I10 Essential (primary) hypertension: Secondary | ICD-10-CM | POA: Diagnosis not present

## 2021-07-07 DIAGNOSIS — J302 Other seasonal allergic rhinitis: Secondary | ICD-10-CM | POA: Diagnosis not present

## 2021-07-13 ENCOUNTER — Telehealth: Payer: Self-pay | Admitting: Internal Medicine

## 2021-07-13 ENCOUNTER — Other Ambulatory Visit: Payer: Self-pay

## 2021-07-13 MED ORDER — LISINOPRIL 20 MG PO TABS: 20.0000 mg | ORAL_TABLET | Freq: Every day | ORAL | 3 refills | Status: DC

## 2021-07-13 NOTE — Telephone Encounter (Signed)
Pt c/o medication issue:  1. Name of Medication:  Lisinopril 10 MG lisinopril (ZESTRIL) 20 MG tablet  2. How are you currently taking this medication (dosage and times per day)?   3. Are you having a reaction (difficulty breathing--STAT)? No   4. What is your medication issue? [name] is calling stating she is currently taking 10 MG's of lisinopril twice daily. Her prescription advises her to only take one because the increase was made after she already had the prescription. Due to this she is running out of medication prior to the 90 day supply says she should. She is requesting a new prescription of 20 MG's be written or the correct 10 MG prescription for her to take twice a day. She would like a callback on which new prescription is written to then discuss having it sent to the pharmacy. Please advise.

## 2021-07-13 NOTE — Telephone Encounter (Signed)
Called patient back, after review of list and recent visit Lisinopril 20 mg daily was on her list during this visit and on her current list. Patient states that she wants the 20 mg sent to her pharmacy.  RX sent to pharmacy.

## 2021-08-18 ENCOUNTER — Other Ambulatory Visit: Payer: Self-pay | Admitting: Family Medicine

## 2021-08-25 NOTE — Telephone Encounter (Signed)
I called the patient and advised her that this was refilled by one of the other providers here. She is planning to find another PCP now that Dr. Junius Roads is no longer here. I also advised her that [name], her cardiologist, could also manage this medication for her in the future.

## 2021-12-04 DIAGNOSIS — H938X3 Other specified disorders of ear, bilateral: Secondary | ICD-10-CM | POA: Diagnosis not present

## 2021-12-25 DIAGNOSIS — H401132 Primary open-angle glaucoma, bilateral, moderate stage: Secondary | ICD-10-CM | POA: Diagnosis not present

## 2021-12-26 DIAGNOSIS — I739 Peripheral vascular disease, unspecified: Secondary | ICD-10-CM | POA: Diagnosis not present

## 2021-12-26 DIAGNOSIS — E785 Hyperlipidemia, unspecified: Secondary | ICD-10-CM | POA: Diagnosis not present

## 2021-12-26 DIAGNOSIS — Z7982 Long term (current) use of aspirin: Secondary | ICD-10-CM | POA: Diagnosis not present

## 2021-12-26 DIAGNOSIS — H409 Unspecified glaucoma: Secondary | ICD-10-CM | POA: Diagnosis not present

## 2021-12-26 DIAGNOSIS — M1990 Unspecified osteoarthritis, unspecified site: Secondary | ICD-10-CM | POA: Diagnosis not present

## 2021-12-26 DIAGNOSIS — I10 Essential (primary) hypertension: Secondary | ICD-10-CM | POA: Diagnosis not present

## 2021-12-26 DIAGNOSIS — J301 Allergic rhinitis due to pollen: Secondary | ICD-10-CM | POA: Diagnosis not present

## 2021-12-26 DIAGNOSIS — G3184 Mild cognitive impairment, so stated: Secondary | ICD-10-CM | POA: Diagnosis not present

## 2021-12-26 DIAGNOSIS — Z85818 Personal history of malignant neoplasm of other sites of lip, oral cavity, and pharynx: Secondary | ICD-10-CM | POA: Diagnosis not present

## 2022-01-24 DIAGNOSIS — T781XXD Other adverse food reactions, not elsewhere classified, subsequent encounter: Secondary | ICD-10-CM | POA: Diagnosis not present

## 2022-01-24 DIAGNOSIS — J3089 Other allergic rhinitis: Secondary | ICD-10-CM | POA: Diagnosis not present

## 2022-01-24 DIAGNOSIS — J301 Allergic rhinitis due to pollen: Secondary | ICD-10-CM | POA: Diagnosis not present

## 2022-01-25 ENCOUNTER — Other Ambulatory Visit: Payer: Self-pay

## 2022-01-25 ENCOUNTER — Encounter: Payer: Self-pay | Admitting: Internal Medicine

## 2022-01-25 ENCOUNTER — Ambulatory Visit: Payer: Medicare PPO | Admitting: Internal Medicine

## 2022-01-25 VITALS — BP 132/70 | HR 67 | Ht 62.0 in | Wt 143.6 lb

## 2022-01-25 DIAGNOSIS — E785 Hyperlipidemia, unspecified: Secondary | ICD-10-CM | POA: Diagnosis not present

## 2022-01-25 DIAGNOSIS — I340 Nonrheumatic mitral (valve) insufficiency: Secondary | ICD-10-CM | POA: Diagnosis not present

## 2022-01-25 DIAGNOSIS — I071 Rheumatic tricuspid insufficiency: Secondary | ICD-10-CM

## 2022-01-25 DIAGNOSIS — I471 Supraventricular tachycardia, unspecified: Secondary | ICD-10-CM

## 2022-01-25 DIAGNOSIS — I10 Essential (primary) hypertension: Secondary | ICD-10-CM | POA: Diagnosis not present

## 2022-01-25 MED ORDER — LISINOPRIL 20 MG PO TABS: 20.0000 mg | ORAL_TABLET | Freq: Every day | ORAL | 3 refills | Status: DC

## 2022-01-25 NOTE — Patient Instructions (Signed)
Medication Instructions:  Your physician recommends that you continue on your current medications as directed. Please refer to the Current Medication list given to you today.  *If you need a refill on your cardiac medications before your next appointment, please call your pharmacy*  Testing/Procedures: Your physician has requested that you have an echocardiogram. Echocardiography is a painless test that uses sound waves to create images of your heart. It provides your doctor with information about the size and shape of your heart and how well your hearts chambers and valves are working. This procedure takes approximately one hour. There are no restrictions for this procedure. -- [employer] [facility] [employer] [address]rd [address]    Follow-Up: At [facility], you and your health needs are our priority.  As part of our continuing mission to provide you with exceptional heart care, we have created designated Provider Care Teams.  These Care Teams include your primary Cardiologist (physician) and Advanced Practice Providers (APPs -  Physician Assistants and Nurse Practitioners) who all work together to provide you with the care you need, when you need it.  We recommend signing up for the patient portal called "MyChart".  Sign up information is provided on this After Visit Summary.  MyChart is used to connect with patients for Virtual Visits (Telemedicine).  Patients are able to view lab/test results, encounter notes, upcoming appointments, etc.  Non-urgent messages can be sent to your provider as well.   To learn more about what you can do with MyChart, go to [email or web address].    Your next appointment:   1 year with [name]

## 2022-01-25 NOTE — Progress Notes (Signed)
OFFICE NOTE  Chief Complaint:  No complaints  Primary Care Physician: Pcp, No  HPI:  [name] is a pleasant 88 year old female previously followed by Dr. Rollene Fare. She's here today to establish cardiac care with me. She has history of hypertension, remote bilateral hip placements and redo hip surgery for failed prosthesis in 2011. 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 She was previously on flecainide but has been taken off of that. She's had no recurrence of her palpitations on beta blockers. She had a negative Myoview for ischemia in 2011 an echocardiogram in 2014 which showed normal systolic function and moderate mitral regurgitation.  She has no complaints today.  The pleasure see [name] back in the office today. She is without any complaints. She continues to be active and denies any worsening shortness of breath or chest pain. She's had no further recurrent palpitations. Heart rate seems well controlled on atenolol. Recently she's had some issues with lower blood pressure and her lisinopril HCTZ was changed to lisinopril. This is associated with a small increase in blood pressure to the normal range. She now feels well again. Should be noted she had an echocardiogram in 2014 which showed moderate MR and moderate TR.  08/09/2016  [name] returns today for follow-up. She continues to be without complaints. She walks regularly and has no shortness of breath or worsening chest pain. Blood pressure is well-controlled today. She just had a physical which was unremarkable. Cholesterol has been well controlled -recent total cholesterol 146, HDL 42, LDL 74 and triglycerides 149. She just had a repeat echocardiogram which shows persistent moderate MR and TR with an EF of 65%. This appears to be unchanged compared to her prior echo in 2014. There is very mild pulmonary hypertension but she is asymptomatic with  this.  08/13/2017  [name] was seen today follow-up. She denies any recurrent SVT. There is no chest pain or /worsening shortness of breath. She says she walks regularly. She had a recent cholesterol profile which is very stable showing total cholesterol is above 150 and LDL 79. She's had normal LV function by echo within the past several years and moderate MR and TR. She is asymptomatic. I don't anticipate any valve surgery that will be necessary.  09/05/2018  [name] returns today for routine follow-up.  She denies any recurrent SVT.  She denies chest pain or shortness of breath.  Cholesterol is been well controlled with LDL 72.  Blood pressure is at goal.  She was noted to have moderate MR and TR by echo in 2017.  Her murmurs are stable.  She denies any new heart failure symptoms.  11/17/2020  [name] is seen today for follow-up.  She was seen twice over the summer for chest pain by our advanced practice providers.  An echo and stress test were ordered.  The echo showed normal systolic function and her stress test showed some mild reversible ischemia but otherwise was thought to be a low risk stress test.  Medical therapy was recommended.  Her chest pain was felt to be atypical.  Subsequently however she was found to have an enlarged tonsil and ultimately was noted to have cancer.  This required significant resection and robotic surgery done at [facility].  Ultimately then she had to relearn to swallow.  She does report her chest pain symptoms have resolved.  01/25/2021  [name] returns today for follow-up.  Overall she says she denies any chest pain or shortness of breath.  Her blood pressure is finally better controlled.  It was a little up today 138/74 but in general it is in the 120s to low 638 systolic.  She recently saw her primary care provider who also noted she was doing well with that.  She is had no palpitations or worsening shortness of breath.  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  01/25/2022  [name] is seen today in follow-up.  She is doing well and denies shortness of breath, chest pain or palpitations.  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  She is active despite having had several hip replacements and a revision due to a recall prosthesis.  She has history of moderate MR and TR on echo but no worsening shortness of breath.  She is due for repeat echo to survey this.  She had an abnormal Myoview in August 2020 which was mild but she has not had any anginal symptoms.  Blood pressure is well controlled.  EKG shows no ischemia.  Her most recent lipid profile showed total cholesterol 154, HDL 50 triglycerides 102 and LDL 85.  PMHx:  Past Medical History:  Diagnosis Date   Allergies    Allergy    Anxiety    Arthritis    Atrophic vaginitis    Cataract    hx/o surgery, both eyes; Dr. Gershon Crane   Complication of anesthesia    Diverticulosis    per 2010 colonoscopy   Dyslipidemia    Full dentures    GERD (gastroesophageal reflux disease)    hx/o, resolved   Glaucoma 2016   H/O echocardiogram 06/22/2010   normal LVEF, moderate mitral and tricuspid regurg 07/2016 [name];  mild aortic valve stenosis, left ventricular normal, EF 55%; Dr. Gwenlyn Found  06/2010   History of cardiovascular stress test 06/22/2010   normal bruce myocadial perfusion study, 72% EF; Dr. Gwenlyn Found   History of mammogram    benign bilat calcifications, heterogeneously dense breasts, stable mammograms 2009-2014   History of uterine cancer 12/2002   s/p TAH and BSO, pelvic and periarotic lymphadenectomy 12/2002, no adjuvant therapy; stage 1b carcinosarcoma of uterus, completed 5 years of f/u as of 2008; Dr. Marti Sleigh gyencology oncology [place]; Dr. 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 Rollene Fare   Osteoporosis    PONV (postoperative nausea and vomiting)    SVT (supraventricular tachycardia) ([place])    asymptomatic 2014, hx/o SVT, Dr. Rollene Fare   Vitamin D deficiency     Past Surgical History:  Procedure Laterality Date   ABDOMINAL HYSTERECTOMY Bilateral    h/o uterine cancer; oophrectomy   CATARACT EXTRACTION     bilat   COLONOSCOPY  08/2016   08/2016  Dr. Benson Norway advised no repeat colonoscopy;  scattered diverticla, medium hemorrhoids 2010; Dr. Benson Norway   JOINT REPLACEMENT     NM MYOCAR PERF WALL MOTION     TONSILLECTOMY Right 12/28/2019   Procedure: TONSILLECTOMY;  Surgeon: Melida Quitter, MD;  Location: [place];  Service: ENT;  Laterality: Right;   TOTAL HIP ARTHROPLASTY Bilateral 2008, 2010    twice left (complication on the left, once on the right; Dr. Alphonzo Severance   TRANSTHORACIC ECHOCARDIOGRAM  02/2013   ordered  for SVT; EF 55-65%; calcified MV annulus, mod MR; RSVP increased; RA mildly dilated; mod TR; PA peak pressure 2mmHg    FAMHx:  Family History  Family history unknown: Yes    SOCHx:   reports that she has never smoked. She has never used smokeless tobacco. She reports that she does not drink alcohol and does not use drugs.  ALLERGIES:  Allergies  Allergen Reactions   Other Nausea Only    "medicines with steroids in it make me nauseous"   Prednisone     Other reaction(s): vomiting    ROS: Pertinent items noted in HPI and remainder of comprehensive ROS otherwise negative.  HOME MEDS: Current Outpatient Medications  Medication Sig Dispense Refill   alendronate (FOSAMAX) 70 MG tablet TAKE 1 TABLET BY MOUTH EVERY 7 DAYS WITH A FULL GLASS OF WATER AN EMPTY STOMACH 12 tablet 3   aspirin 81 MG EC tablet Take by mouth.     atenolol (TENORMIN) 50 MG tablet TAKE 1 TABLET BY MOUTH EVERY DAY 90 tablet 3   brimonidine (ALPHAGAN P) 0.1 % SOLN Place 1 drop into both eyes 2 times daily.     brimonidine (ALPHAGAN P) 0.1 % SOLN 1 drop  into affected eye     calcium carbonate (CALCIUM 600) 600 MG TABS tablet Take 1 tablet (600 mg total) by mouth 2 (two) times daily with a meal. 180 tablet 3   cetirizine (ZYRTEC) 10 MG tablet Take 1 tablet (10 mg total) by mouth daily as needed for allergies. 90 tablet 3   Cholecalciferol (D3 HIGH POTENCY) 25 MCG (1000 UT) capsule Take 1 capsule (1,000 Units total) by mouth daily. 90 capsule 3   Ciclopirox 0.77 % gel Apply to nails once daily     COMBIGAN 0.2-0.5 % ophthalmic solution      latanoprost (XALATAN) 0.005 % ophthalmic solution Place 1 drop into both eyes.     levocetirizine (XYZAL) 5 MG tablet TAKE 1 TABLET BY MOUTH EVERY DAY IN THE EVENING FOR 30 DAYS     lisinopril (ZESTRIL) 20 MG tablet Take 1 tablet (20 mg total) by mouth daily. 90 tablet 3   montelukast (SINGULAIR) 10 MG tablet TAKE 1 TABLET BY MOUTH EVERY DAY 90 tablet 3   simvastatin (ZOCOR) 40 MG tablet TAKE 1 TABLET BY MOUTH EVERYDAY AT BEDTIME 90 tablet 3   No current facility-administered medications for this visit.    LABS/IMAGING: No results found for this or any previous visit (from the past 48 hour(s)). No results found.  VITALS: BP 132/70    Pulse 67    Ht 5\' 2"  (1.575 m)    Wt 143 lb 9.6 oz (65.1 kg)    SpO2 97%    BMI 26.26 kg/m   EXAM: General appearance: alert and no distress Neck: no carotid bruit, no JVD and thyroid not enlarged, symmetric, no tenderness/mass/nodules Lungs: clear to auscultation bilaterally Heart: regular rate and rhythm, S1, S2 normal, systolic murmur: holosystolic 3/6, blowing at apex and 2nd systolic murmur: early systolic 2/6, crescendo at 2nd right intercostal space Abdomen: soft, non-tender; bowel sounds normal; no masses,  no organomegaly Extremities: extremities normal, atraumatic, no cyanosis or edema Pulses: 2+ and symmetric Skin: Skin color, texture, turgor normal. No rashes or lesions Neurologic: Grossly normal Psych: Mood, affect normal  EKG: Normal sinus rhythm 67,  nonspecific T wave changes-personally reviewed  ASSESSMENT: LGL syndrome History of AVNRT Hypertension Dyslipidemia Moderate MR and moderate TR (2017), LVEF 65%, however improved with EF greater  than 99%, grade 2 diastolic dysfunction and mild pulmonary hypertension with RVSP 40 mmHg (06/2019)  PLAN: 1.   Mrs. Kuntzman continues to do well without any palpitations, chest pain or worsening shortness of breath.  She is due for repeat echo to reassess her MR and TR.  Blood pressure is well controlled.  Cholesterol is at target.  She denies any palpitations..  Follow-up with me annually or sooner as necessary.  Pixie Casino, MD, [facility], [place] Director of the Advanced Lipid Disorders &  Cardiovascular Risk Reduction Clinic Diplomate of the American Board of Clinical Lipidology Attending Cardiologist  Direct Dial: [phone]   Fax: [phone]  Website:  [email or web address] Lesleyanne Politte 01/25/2022, 8:09 AM

## 2022-02-26 ENCOUNTER — Ambulatory Visit (HOSPITAL_COMMUNITY): Payer: Medicare PPO | Attending: Internal Medicine

## 2022-02-26 ENCOUNTER — Other Ambulatory Visit: Payer: Self-pay

## 2022-02-26 DIAGNOSIS — I340 Nonrheumatic mitral (valve) insufficiency: Secondary | ICD-10-CM | POA: Diagnosis not present

## 2022-02-26 DIAGNOSIS — I071 Rheumatic tricuspid insufficiency: Secondary | ICD-10-CM | POA: Diagnosis not present

## 2022-02-26 LAB — ECHOCARDIOGRAM COMPLETE
Area-P 1/2: 4.15 cm2
S' Lateral: 2.8 cm

## 2022-03-05 DIAGNOSIS — C099 Malignant neoplasm of tonsil, unspecified: Secondary | ICD-10-CM | POA: Diagnosis not present

## 2022-04-11 DIAGNOSIS — H401132 Primary open-angle glaucoma, bilateral, moderate stage: Secondary | ICD-10-CM | POA: Diagnosis not present

## 2022-05-11 DIAGNOSIS — Z1231 Encounter for screening mammogram for malignant neoplasm of breast: Secondary | ICD-10-CM | POA: Diagnosis not present

## 2022-06-11 DIAGNOSIS — C099 Malignant neoplasm of tonsil, unspecified: Secondary | ICD-10-CM | POA: Diagnosis not present

## 2022-06-11 DIAGNOSIS — H6981 Other specified disorders of Eustachian tube, right ear: Secondary | ICD-10-CM | POA: Diagnosis not present

## 2022-06-22 DIAGNOSIS — H903 Sensorineural hearing loss, bilateral: Secondary | ICD-10-CM | POA: Diagnosis not present

## 2022-08-27 DIAGNOSIS — Z0000 Encounter for general adult medical examination without abnormal findings: Secondary | ICD-10-CM | POA: Diagnosis not present

## 2022-08-27 DIAGNOSIS — I272 Pulmonary hypertension, unspecified: Secondary | ICD-10-CM | POA: Diagnosis not present

## 2022-08-27 DIAGNOSIS — I10 Essential (primary) hypertension: Secondary | ICD-10-CM | POA: Diagnosis not present

## 2022-08-27 DIAGNOSIS — E785 Hyperlipidemia, unspecified: Secondary | ICD-10-CM | POA: Diagnosis not present

## 2022-08-27 DIAGNOSIS — E559 Vitamin D deficiency, unspecified: Secondary | ICD-10-CM | POA: Diagnosis not present

## 2022-08-27 DIAGNOSIS — I5189 Other ill-defined heart diseases: Secondary | ICD-10-CM | POA: Diagnosis not present

## 2022-08-27 DIAGNOSIS — H401130 Primary open-angle glaucoma, bilateral, stage unspecified: Secondary | ICD-10-CM | POA: Diagnosis not present

## 2022-08-27 DIAGNOSIS — Z23 Encounter for immunization: Secondary | ICD-10-CM | POA: Diagnosis not present

## 2022-08-27 DIAGNOSIS — J302 Other seasonal allergic rhinitis: Secondary | ICD-10-CM | POA: Diagnosis not present

## 2022-09-03 DIAGNOSIS — H401131 Primary open-angle glaucoma, bilateral, mild stage: Secondary | ICD-10-CM | POA: Diagnosis not present

## 2022-09-03 DIAGNOSIS — Z961 Presence of intraocular lens: Secondary | ICD-10-CM | POA: Diagnosis not present

## 2022-09-24 DIAGNOSIS — Z780 Asymptomatic menopausal state: Secondary | ICD-10-CM | POA: Diagnosis not present

## 2022-09-24 DIAGNOSIS — M8588 Other specified disorders of bone density and structure, other site: Secondary | ICD-10-CM | POA: Diagnosis not present

## 2022-09-24 DIAGNOSIS — M810 Age-related osteoporosis without current pathological fracture: Secondary | ICD-10-CM | POA: Diagnosis not present

## 2022-11-05 DIAGNOSIS — H903 Sensorineural hearing loss, bilateral: Secondary | ICD-10-CM | POA: Diagnosis not present

## 2022-11-15 ENCOUNTER — Other Ambulatory Visit: Payer: Self-pay | Admitting: Family

## 2022-11-19 DIAGNOSIS — T486X1A Poisoning by antiasthmatics, accidental (unintentional), initial encounter: Secondary | ICD-10-CM | POA: Diagnosis not present

## 2022-11-19 DIAGNOSIS — T2692XA Corrosion of left eye and adnexa, part unspecified, initial encounter: Secondary | ICD-10-CM | POA: Diagnosis not present

## 2022-11-23 DIAGNOSIS — Z888 Allergy status to other drugs, medicaments and biological substances status: Secondary | ICD-10-CM | POA: Diagnosis not present

## 2022-11-23 DIAGNOSIS — R1310 Dysphagia, unspecified: Secondary | ICD-10-CM | POA: Diagnosis not present

## 2022-11-23 DIAGNOSIS — C099 Malignant neoplasm of tonsil, unspecified: Secondary | ICD-10-CM | POA: Diagnosis not present

## 2022-11-28 DIAGNOSIS — I4719 Other supraventricular tachycardia: Secondary | ICD-10-CM | POA: Diagnosis not present

## 2022-11-28 DIAGNOSIS — J302 Other seasonal allergic rhinitis: Secondary | ICD-10-CM | POA: Diagnosis not present

## 2022-11-28 DIAGNOSIS — I10 Essential (primary) hypertension: Secondary | ICD-10-CM | POA: Diagnosis not present

## 2022-11-28 DIAGNOSIS — I471 Supraventricular tachycardia, unspecified: Secondary | ICD-10-CM | POA: Diagnosis not present

## 2022-12-05 DIAGNOSIS — H6121 Impacted cerumen, right ear: Secondary | ICD-10-CM | POA: Diagnosis not present

## 2022-12-18 ENCOUNTER — Encounter: Payer: Self-pay | Admitting: Physician Assistant

## 2022-12-18 ENCOUNTER — Ambulatory Visit: Payer: Medicare PPO | Attending: Physician Assistant | Admitting: Physician Assistant

## 2022-12-18 VITALS — BP 160/86 | HR 70 | Ht 61.0 in | Wt 143.0 lb

## 2022-12-18 DIAGNOSIS — Z79899 Other long term (current) drug therapy: Secondary | ICD-10-CM

## 2022-12-18 DIAGNOSIS — M79606 Pain in leg, unspecified: Secondary | ICD-10-CM | POA: Diagnosis not present

## 2022-12-18 DIAGNOSIS — I10 Essential (primary) hypertension: Secondary | ICD-10-CM

## 2022-12-18 DIAGNOSIS — R9439 Abnormal result of other cardiovascular function study: Secondary | ICD-10-CM

## 2022-12-18 MED ORDER — LISINOPRIL 40 MG PO TABS: 40.0000 mg | ORAL_TABLET | Freq: Every day | ORAL | 3 refills | Status: DC

## 2022-12-18 NOTE — Patient Instructions (Signed)
Medication Instructions:  INCREASE Lisinopril to 40 mg daily in the mornings SWITCH Atenolol to evenings/nighttime  *If you need a refill on your cardiac medications before your next appointment, please call your pharmacy*  Lab Work: Your physician recommends that you return for lab work in 2-3 weeks:  BMP  If you have labs (blood work) drawn today and your tests are completely normal, you will receive your results only by: [place] (if you have MyChart) OR A paper copy in the mail If you have any lab test that is abnormal or we need to change your treatment, we will call you to review the results.  Testing/Procedures: Your physician has requested that you have an ankle brachial index (ABI). During this test an ultrasound and blood pressure cuff are used to evaluate the arteries that supply the arms and legs with blood. Allow thirty minutes for this exam. There are no restrictions or special instructions.  Your physician has requested that you have a lower venous duplex. This test is an ultrasound of the veins in the legs. It looks at venous blood flow that carries blood from the heart to the legs. Allow one hour for a Lower Venous exam. There are no restrictions or special instructions.   Please schedule both within a week  Follow-Up: At [facility], you and your health needs are our priority.  As part of our continuing mission to provide you with exceptional heart care, we have created designated Provider Care Teams.  These Care Teams include your primary Cardiologist (physician) and Advanced Practice Providers (APPs -  Physician Assistants and Nurse Practitioners) who all work together to provide you with the care you need, when you need it.   Your next appointment:   6 week(s)  The format for your next appointment:   In Person  Provider:   APP        Other Instructions MONITOR blood pressure at home 2 times a day and log readings. 1st reading will need to be  taken 2 hours after morning medications and the 2nd reading will need to be at a time of your choosing at that time daily. Bring bring log with you to follow up appointment.  Tips to Measure your Blood Pressure Correctly  To determine whether you have hypertension, a medical professional will take a blood pressure reading. How you prepare for the test, the position of your arm, and other factors can change a blood pressure reading by 10% or more. That could be enough to hide high blood pressure, start you on a drug you don't really need, or lead your doctor to incorrectly adjust your medications.  National and international guidelines offer specific instructions for measuring blood pressure. If a doctor, nurse, or medical assistant isn't doing it right, don't hesitate to ask him or her to get with the guidelines.  Here's what you can do to ensure a correct reading:  Don't drink a caffeinated beverage or smoke during the 30 minutes before the test.  Sit quietly for five minutes before the test begins.  During the measurement, sit in a chair with your feet on the floor and your arm supported so your elbow is at about heart level.  The inflatable part of the cuff should completely cover at least 80% of your upper arm, and the cuff should be placed on bare skin, not over a shirt.  Don't talk during the measurement.  Have your blood pressure measured twice, with a brief break in between.  If the readings are different by 5 points or more, have it done a third time.  There are times to break these rules. If you sometimes feel lightheaded when getting out of bed in the morning or when you stand after sitting, you should have your blood pressure checked while seated and then while standing to see if it falls from one position to the next.  Because blood pressure varies throughout the day, your doctor will rarely diagnose hypertension on the basis of a single reading. Instead, he or she will want to confirm  the measurements on at least two occasions, usually within a few weeks of one another. The exception to this rule is if you have a blood pressure reading of 180/110 mm Hg or higher. A result this high usually calls for prompt treatment.  It's a good idea to have your blood pressure measured in both arms at least once, since the reading in one arm (usually the right) may be higher than that in the left. A 2014 study in The American Journal of Medicine of nearly 3,400 people found average arm- to-arm differences in systolic blood pressure of about 5 points. The higher number should be used to make treatment decisions.  In 2017, new guidelines from the [place], the [employer] of Cardiology, and nine other health organizations lowered the diagnosis of high blood pressure to 130/80 mm Hg or higher for all adults. The guidelines also redefined the various blood pressure categories to now include normal, elevated, Stage 1 hypertension, Stage 2 hypertension, and hypertensive crisis (see "Blood pressure categories").  Blood pressure categories  Blood pressure category SYSTOLIC (upper number)  DIASTOLIC (lower number)  Normal Less than 120 mm Hg and Less than 80 mm Hg  Elevated 120-129 mm Hg and Less than 80 mm Hg  High blood pressure: Stage 1 hypertension 130-139 mm Hg or 80-89 mm Hg  High blood pressure: Stage 2 hypertension 140 mm Hg or higher or 90 mm Hg or higher  Hypertensive crisis (consult your doctor immediately) Higher than 180 mm Hg and/or Higher than 120 mm Hg  Source: American Heart Association and American Stroke Association. For more on getting your blood pressure under control, buy Controlling Your Blood Pressure, a Special Health Report from [facility].   Blood Pressure Log   Date   Time  Blood Pressure  Position  Example: Nov 1 9 AM 124/78 sitting                                                       Important Information  About Sugar

## 2022-12-18 NOTE — Progress Notes (Signed)
Cardiology Office Note:    Date:  12/18/2022   ID:  [name], DOB 1933-01-14, MRN [id number]  PCP:  Kathyrn Lass   [facility] Cardiologist:  Pixie Casino, MD     Referring MD: No ref. provider found   Chief Complaint  Patient presents with   Follow-up    Seen for Dr. Debara [name]    History of Present Illness:    [name] is a 89 y.o. 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  She was previously on flecainide however has been taken off.  She has no recurrence of palpitation on beta-blocker.  She had a negative Myoview for ischemia in 2011.  Echocardiogram in 2014 showed normal EF, moderate MR and TR. echocardiogram in July 2020 showed EF greater than 65%.  Due to chest discomfort, patient underwent Myoview in August 2020 that showed EF 52%, mildly abnormal nuclear stress test with mild inferobasal ischemia, overall considered low risk.  Medical management was recommended.  She was subsequently diagnosed with enlarged tonsil and ultimately noted to have cancer.  She required significant resection and robotic surgery at [facility] and had to relearn how to swallow.  Patient was last seen by Dr. Debara [name] in February 2023 at which time she was doing well.  Echocardiogram obtained on 02/26/2022 showed EF of 60 to 65%, no regional wall motion abnormality, RVSP 41.7 mmHg, moderate LAE, mild to moderate MR, moderate to severe TR.  Patient presents today for evaluation of bilateral feet "fullness".  She complains of fullness in both feet every morning.  Symptoms seems to improve with ambulation.  On physical exam, she has very weak dorsalis pedis pulse and posterior tibial pulse.  She usually experience the bilateral feet fullness at night.  Her left lower extremity is bigger than the right lower extremity.  I recommended lower extremity venous Doppler to rule out DVT and ABI due to weak pulses.   Her blood pressure is also high today.  Even on manual recheck, her blood pressure was 160/86.  I will increase lisinopril to 40 mg daily.  She is currently taking both lisinopril and atenolol in the morning, I recommend to move atenolol to nighttime to make her blood pressure more even.  She will need a basic metabolic panel in 2 to 3 weeks given the increase of lisinopril.  I plan to see her back in 6 weeks for medication titration.  If both venous Doppler and ABI are normal, I would not recommend any further study for her lower extremity discomfort.    Past Medical History:  Diagnosis Date   Allergies    Allergy    Anxiety    Arthritis    Atrophic vaginitis    Cataract    hx/o surgery, both eyes; Dr. Gershon Crane   Complication of anesthesia    Diverticulosis    per 2010 colonoscopy   Dyslipidemia    Full dentures    GERD (gastroesophageal reflux disease)    hx/o, resolved   Glaucoma 2016   H/O echocardiogram 06/22/2010   normal LVEF, moderate mitral and tricuspid regurg 07/2016 Dr. Debara [name];  mild aortic valve stenosis, left ventricular normal, EF 55%; Dr. Gwenlyn Found  06/2010   History of cardiovascular stress test 06/22/2010   normal bruce myocadial perfusion study, 72% EF; Dr. 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 Marti Sleigh gyencology oncology [place]; Dr. 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 Rollene Fare   Osteoporosis    PONV (postoperative nausea and vomiting)    SVT (supraventricular tachycardia)    asymptomatic 2014, hx/o SVT, Dr. Rollene Fare   Vitamin D deficiency     Past  Surgical History:  Procedure Laterality Date   ABDOMINAL HYSTERECTOMY Bilateral    h/o uterine cancer; oophrectomy   CATARACT EXTRACTION     bilat   COLONOSCOPY  08/2016   08/2016  Dr. Benson Norway advised no repeat colonoscopy;  scattered diverticla, medium hemorrhoids 2010; Dr. Benson Norway   JOINT REPLACEMENT     NM MYOCAR PERF WALL MOTION     TONSILLECTOMY Right 12/28/2019   Procedure: TONSILLECTOMY;  Surgeon: Melida Quitter, MD;  Location: [place];  Service: ENT;  Laterality: Right;   TOTAL HIP ARTHROPLASTY Bilateral 2008, 2010    twice left (complication on the left, once on the right; Dr. Alphonzo Severance   TRANSTHORACIC ECHOCARDIOGRAM  02/2013   ordered for SVT; EF 55-65%; calcified MV annulus, mod MR; RSVP increased; RA mildly dilated; mod TR; PA peak pressure 53mHg    Current Medications: Current Meds  Medication Sig   aspirin 81 MG EC tablet Take by mouth.   atenolol (TENORMIN) 50 MG tablet TAKE 1 TABLET BY MOUTH EVERY DAY   brimonidine (ALPHAGAN P) 0.1 % SOLN Place 1 drop into both eyes 2 times daily.   brimonidine (ALPHAGAN P) 0.1 % SOLN 1 drop into affected eye   calcium carbonate (CALCIUM 600) 600 MG TABS tablet Take 1 tablet (600 mg total) by mouth 2 (two) times daily with a meal.   cetirizine (ZYRTEC) 10 MG tablet Take 1 tablet (10 mg total) by mouth daily as needed for allergies.   Cholecalciferol (D3 HIGH POTENCY) 25 MCG (1000 UT) capsule Take 1 capsule (1,000 Units total) by mouth daily.   Ciclopirox 0.77 % gel Apply to nails once daily   COMBIGAN 0.2-0.5 % ophthalmic solution    latanoprost (XALATAN) 0.005 % ophthalmic solution Place 1 drop into both eyes.   levocetirizine (XYZAL) 5 MG tablet TAKE 1 TABLET BY MOUTH EVERY DAY IN THE EVENING FOR 30 DAYS   montelukast (SINGULAIR) 10 MG tablet TAKE 1 TABLET BY MOUTH EVERY DAY   simvastatin (ZOCOR) 40 MG tablet TAKE 1 TABLET BY MOUTH EVERYDAY AT BEDTIME   [DISCONTINUED] lisinopril (ZESTRIL) 20 MG tablet Take 1 tablet (20 mg  total) by mouth daily.     Allergies:   Other and Prednisone   Social History   Socioeconomic History   Marital status: Married    Spouse name: Not on file   Number of children: 1   Years of education: Not on file   Highest education level: Not on file  Occupational History   Not on file  Tobacco Use   Smoking status: Never   Smokeless tobacco: Never  Substance and Sexual Activity   Alcohol use: No   Drug use: No   Sexual activity: Never    Birth control/protection: Surgical    Comment: walks 3x/wk, retired, married, 2 grandchildren  Other Topics Concern   Not on file  Social History Narrative   Married, 1 son, 2 grandchildren; exercise 4 days per week - walking, mall and [employer].  Husband has hx/o CVA, but is ambulatory.  06/2017   Social Determinants of Health   Financial Resource Strain: Not on file  Food Insecurity: Not on file  Transportation Needs: Not on file  Physical Activity: Not on file  Stress: Not on file  Social Connections: Not on file     Family History: The patient's Family history is unknown by patient.  ROS:   Please see the history of present illness.     All other systems reviewed and are negative.  EKGs/Labs/Other Studies Reviewed:    The following studies were reviewed today:  Echo 02/26/2022 1. Left ventricular ejection fraction, by estimation, is 60 to 65%. The  left ventricle has normal function. The left ventricle has no regional  wall motion abnormalities. Left ventricular diastolic parameters were  normal. The average left ventricular  global longitudinal strain is -22.4 %. The global longitudinal strain is  normal.   2. Right ventricular systolic function is normal. The right ventricular  size is normal. There is mildly elevated pulmonary artery systolic  pressure. The estimated right ventricular systolic pressure is 29.2 mmHg.   3. Left atrial size was moderately dilated.   4. The mitral valve is abnormal. Mild to moderate mitral  valve  regurgitation.   5. Tricuspid valve regurgitation is moderate to severe.   6. The aortic valve is tricuspid. Aortic valve regurgitation is not  visualized. Aortic valve sclerosis is present, with no evidence of aortic  valve stenosis.   7. The inferior vena cava is normal in size with greater than 50%  respiratory variability, suggesting right atrial pressure of 3 mmHg.   Comparison(s): 06/30/19: LVEF >65%. Mild MR.   EKG:  EKG is ordered today.  The ekg ordered today demonstrates normal sinus rhythm, no significant ST-T wave changes.  Recent Labs: No results found for requested labs within last 365 days.  Recent Lipid Panel    Component Value Date/Time   CHOL 172 08/15/2020 0914   CHOL 158 08/12/2019 0947   TRIG 161 (H) 08/15/2020 0914   HDL 56 08/15/2020 0914   HDL 45 08/12/2019 0947   CHOLHDL 3.1 08/15/2020 0914   VLDL 26 07/11/2017 0912   LDLCALC 90 08/15/2020 0914     Risk Assessment/Calculations:           Physical Exam:    VS:  BP (!) 160/86   Pulse 70   Ht '5\' 1"'$  (1.549 m)   Wt 143 lb (64.9 kg)   SpO2 99%   BMI 27.02 kg/m     HYPERTENSION CONTROL Vitals:   12/18/22 0954 12/18/22 2005  BP: (!) 145/87 (!) 160/86    The patient's blood pressure is elevated above target today.  In order to address the patient's elevated BP: A current anti-hypertensive medication was adjusted today.      Wt Readings from Last 3 Encounters:  12/18/22 143 lb (64.9 kg)  01/25/22 143 lb 9.6 oz (65.1 kg)  01/25/21 143 lb (64.9 kg)     GEN:  Well nourished, well developed in no acute distress HEENT: Normal NECK: No JVD; No carotid bruits LYMPHATICS: No lymphadenopathy CARDIAC: RRR, no murmurs, rubs, gallops RESPIRATORY:  Clear to auscultation without rales, wheezing or rhonchi  ABDOMEN: Soft, non-tender, non-distended MUSCULOSKELETAL:  No edema; No deformity  SKIN: Warm and dry NEUROLOGIC:  Alert and oriented x 3 PSYCHIATRIC:  Normal  affect   ASSESSMENT:     1. Abnormal stress test   2. Medication management   3. Pain of lower extremity, unspecified laterality   4. Essential hypertension    PLAN:    In order of problems listed above:  History of abnormal stress test: Previous Myoview showed mild ischemia, she was medically managed.  She denies any recent chest pain.  Her recent EKG obtained at PCPs office and today's EKG is unchanged from her baseline.  Lower extremity fullness: She complains of a sense of fullness in her bilateral feet, she has weak pulses on exam, however interestingly, she does not have obvious pain or weakness with ambulation.  I recommended lower extremity venous Doppler and ABI.  If negative, no further workup  Hypertension: Blood pressure elevated today, I recommended increase lisinopril to 40 mg daily and switch atenolol to nighttime.  I will bring the patient back in 6 weeks for reassessment.           Medication Adjustments/Labs and Tests Ordered: Current medicines are reviewed at length with the patient today.  Concerns regarding medicines are outlined above.  Orders Placed This Encounter  Procedures   Basic metabolic panel   EKG [id number]   VAS [place] ABI WITH/WO TBI   VAS [place] LOWER EXTREMITY VENOUS (DVT)   Meds ordered this encounter  Medications   lisinopril (ZESTRIL) 40 MG tablet    Sig: Take 1 tablet (40 mg total) by mouth daily.    Dispense:  90 tablet    Refill:  3    Dose change new Rx    Patient Instructions  Medication Instructions:  INCREASE Lisinopril to 40 mg daily in the mornings SWITCH Atenolol to evenings/nighttime  *If you need a refill on your cardiac medications before your next appointment, please call your pharmacy*  Lab Work: Your physician recommends that you return for lab work in 2-3 weeks:  BMP  If you have labs (blood work) drawn today and your tests are completely normal, you will receive your results only by: MyChart Message (if you have MyChart) OR A paper copy in  the mail If you have any lab test that is abnormal or we need to change your treatment, we will call you to review the results.  Testing/Procedures: Your physician has requested that you have an ankle brachial index (ABI). During this test an ultrasound and blood pressure cuff are used to evaluate the arteries that supply the arms and legs with blood. Allow thirty minutes for this exam. There are no restrictions or special instructions.  Your physician has requested that you have a lower venous duplex. This test is an ultrasound of the veins in the legs. It looks at venous blood flow that carries blood from the heart to the legs. Allow one hour for a Lower Venous exam. There are no restrictions or special instructions.   Please schedule both within a week  Follow-Up: At [facility], you and your health needs are our priority.  As part of our continuing mission to provide you with exceptional heart care, we have created designated Provider Care Teams.  These Care Teams include your primary Cardiologist (physician) and Advanced Practice Providers (APPs -  Physician Assistants and Nurse Practitioners) who all work together to provide you with the care you need, when you need it.   Your next appointment:   6 week(s)  The format for your next appointment:   In Person  Provider:   APP  Other Instructions MONITOR blood pressure at home 2 times a day and log readings. 1st reading will need to be taken 2 hours after morning medications and the 2nd reading will need to be at a time of your choosing at that time daily. Bring bring log with you to follow up appointment.  Tips to Measure your Blood Pressure Correctly  To determine whether you have hypertension, a medical professional will take a blood pressure reading. How you prepare for the test, the position of your arm, and other factors can change a blood pressure reading by 10% or more. That could be enough to hide high blood  pressure, start you on a drug you don't really need, or lead your doctor to incorrectly adjust your medications.  National and international guidelines offer specific instructions for measuring blood pressure. If a doctor, nurse, or medical assistant isn't doing it right, don't hesitate to ask him or her to get with the guidelines.  Here's what you can do to ensure a correct reading:  Don't drink a caffeinated beverage or smoke during the 30 minutes before the test.  Sit quietly for five minutes before the test begins.  During the measurement, sit in a chair with your feet on the floor and your arm supported so your elbow is at about heart level.  The inflatable part of the cuff should completely cover at least 80% of your upper arm, and the cuff should be placed on bare skin, not over a shirt.  Don't talk during the measurement.  Have your blood pressure measured twice, with a brief break in between. If the readings are different by 5 points or more, have it done a third time.  There are times to break these rules. If you sometimes feel lightheaded when getting out of bed in the morning or when you stand after sitting, you should have your blood pressure checked while seated and then while standing to see if it falls from one position to the next.  Because blood pressure varies throughout the day, your doctor will rarely diagnose hypertension on the basis of a single reading. Instead, he or she will want to confirm the measurements on at least two occasions, usually within a few weeks of one another. The exception to this rule is if you have a blood pressure reading of 180/110 mm Hg or higher. A result this high usually calls for prompt treatment.  It's a good idea to have your blood pressure measured in both arms at least once, since the reading in one arm (usually the right) may be higher than that in the left. A 2014 study in The American Journal of Medicine of nearly 3,400 people found average  arm- to-arm differences in systolic blood pressure of about 5 points. The higher number should be used to make treatment decisions.  In 2017, new guidelines from the [place], the [employer] of Cardiology, and nine other health organizations lowered the diagnosis of high blood pressure to 130/80 mm Hg or higher for all adults. The guidelines also redefined the various blood pressure categories to now include normal, elevated, Stage 1 hypertension, Stage 2 hypertension, and hypertensive crisis (see "Blood pressure categories").  Blood pressure categories  Blood pressure category SYSTOLIC (upper number)  DIASTOLIC (lower number)  Normal Less than 120 mm Hg and Less than 80 mm Hg  Elevated 120-129 mm Hg and Less than 80 mm Hg  High blood pressure: Stage 1 hypertension 130-139 mm Hg or 80-89 mm  Hg  High blood pressure: Stage 2 hypertension 140 mm Hg or higher or 90 mm Hg or higher  Hypertensive crisis (consult your doctor immediately) Higher than 180 mm Hg and/or Higher than 120 mm Hg  Source: American Heart Association and American Stroke Association. For more on getting your blood pressure under control, buy Controlling Your Blood Pressure, a Special Health Report from [facility].   Blood Pressure Log   Date   Time  Blood Pressure  Position  Example: Nov 1 9 AM 124/78 sitting                                                       Important Information About Sugar         Hilbert Corrigan, [place]  12/18/2022 8:05 PM    [place]

## 2022-12-19 ENCOUNTER — Other Ambulatory Visit: Payer: Self-pay | Admitting: Physician Assistant

## 2022-12-19 DIAGNOSIS — M79606 Pain in leg, unspecified: Secondary | ICD-10-CM

## 2022-12-25 ENCOUNTER — Ambulatory Visit (HOSPITAL_COMMUNITY)
Admission: RE | Admit: 2022-12-25 | Discharge: 2022-12-25 | Disposition: A | Discharge status: Home / Self Care | Payer: Medicare PPO | Source: Ambulatory Visit | Attending: Cardiovascular Disease | Admitting: Cardiovascular Disease

## 2022-12-25 ENCOUNTER — Inpatient Hospital Stay (HOSPITAL_COMMUNITY): Admission: RE | Admit: 2022-12-25 | Payer: Medicare PPO | Source: Ambulatory Visit

## 2022-12-25 DIAGNOSIS — M79606 Pain in leg, unspecified: Secondary | ICD-10-CM

## 2022-12-27 ENCOUNTER — Other Ambulatory Visit: Payer: Self-pay | Admitting: Physician Assistant

## 2022-12-27 ENCOUNTER — Ambulatory Visit (HOSPITAL_COMMUNITY)
Admission: RE | Admit: 2022-12-27 | Discharge: 2022-12-27 | Disposition: A | Discharge status: Home / Self Care | Payer: Medicare PPO | Source: Ambulatory Visit | Attending: Physician Assistant | Admitting: Physician Assistant

## 2022-12-27 DIAGNOSIS — I10 Essential (primary) hypertension: Secondary | ICD-10-CM

## 2022-12-27 DIAGNOSIS — M79606 Pain in leg, unspecified: Secondary | ICD-10-CM

## 2022-12-27 DIAGNOSIS — Z79899 Other long term (current) drug therapy: Secondary | ICD-10-CM

## 2022-12-27 DIAGNOSIS — R0989 Other specified symptoms and signs involving the circulatory and respiratory systems: Secondary | ICD-10-CM | POA: Diagnosis not present

## 2022-12-27 DIAGNOSIS — R9439 Abnormal result of other cardiovascular function study: Secondary | ICD-10-CM

## 2022-12-29 LAB — VAS US ABI WITH/WO TBI
Left ABI: 1.07
Right ABI: 1.07

## 2023-01-02 NOTE — Progress Notes (Signed)
Venous doppler shows no blood clot in the leg. There are some cystic in the back of the knee which are benign. There are also 2 avascular mass in her right hip which could be seroma. I don't think those avascular mass can cause her symptom. Please forward a copy to PCP to monitor in the future.

## 2023-01-03 DIAGNOSIS — H401131 Primary open-angle glaucoma, bilateral, mild stage: Secondary | ICD-10-CM | POA: Diagnosis not present

## 2023-01-17 ENCOUNTER — Encounter: Payer: Self-pay | Admitting: Orthopedic Surgery

## 2023-01-17 ENCOUNTER — Ambulatory Visit: Payer: Medicare PPO | Admitting: Orthopedic Surgery

## 2023-01-17 ENCOUNTER — Ambulatory Visit (INDEPENDENT_AMBULATORY_CARE_PROVIDER_SITE_OTHER): Payer: Medicare PPO

## 2023-01-17 ENCOUNTER — Ambulatory Visit: Payer: Self-pay

## 2023-01-17 DIAGNOSIS — M79605 Pain in left leg: Secondary | ICD-10-CM | POA: Diagnosis not present

## 2023-01-17 NOTE — Progress Notes (Signed)
Office Visit Note   Patient: [name]           Date of Birth: 05/28/1933           MRN: [id number] Visit Date: 01/17/2023 Requested by: Emma Jewel, MD [address] [place],  [place] [address] PCP: Emma Jewel, MD  Subjective: Chief Complaint  Patient presents with   Left Hip - Pain   Right Ankle - Pain   Left Ankle - Pain    HPI: [name] is a 89 y.o. female who presents to the office reporting bilateral ankle pain and left hip pain.  She describes no groin pain..  Pain does not wake her from sleep at night.  Reports some occasional trochanteric pain which radiates down the lateral side of her leg.  She was last seen in 2019.  She has had bilateral hip replacements.  Does report mild pain from the back as well.  She states she is not limping.                ROS: All systems reviewed are negative as they relate to the chief complaint within the history of present illness.  Patient denies fevers or chills.  Assessment & Plan: Visit Diagnoses:  1. Pain in left leg     Plan: Impression is hip examination bilaterally as well as normal hip radiographs following hip replacement surgery.  I think this may be coming from her back.  Symptoms ongoing for at least 2 months.  [name] is not really much of a complainer so I think that further workup is indicated.  MRI scan lumbar spine pending with possible ESI's to follow.  Does not appear to be any type of loosening or infection issue with the left hip prosthesis.  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  Need CT scan on the pelvis at this time for further evaluation and management of that right sided asymptomatic prosthesis.  MRI scan lumbar spine also pending. Follow-Up Instructions: No follow-ups on  file.   Orders:  Orders Placed This Encounter  Procedures   XR HIP UNILAT W OR W/O PELVIS 2-3 VIEWS LEFT   XR Lumbar Spine 2-3 Views   No orders of the defined types were placed in this encounter.     Procedures: No procedures performed   Clinical Data: No additional findings.  Objective: Vital Signs: There were no vitals taken for this visit.  Physical Exam:  Constitutional: Patient appears well-developed HEENT:  Head: Normocephalic Eyes:EOM are normal Neck: Normal range of motion Cardiovascular: Normal rate Pulmonary/chest: Effort normal Neurologic: Patient is alert Skin: Skin is warm Psychiatric: Patient has normal mood and affect  Ortho Exam: Ortho exam demonstrates normal gait and alignment.  Very good hip flexion abduction and adduction strength bilaterally.  Mildly positive nerve root tension signs on the left negative on the right.  5 out of 5 ankle dorsiflexion plantarflexion quad and hamstring strength.  No discrete trochanteric tenderness is present.  No groin pain on the right with internal/external rotation of the leg.  Leg lengths equal  Specialty Comments:  No specialty comments available.  Imaging: No results found.   PMFS History: Patient Active Problem List   Diagnosis Date Noted   Pharyngeal dysphagia 04/04/2020   Tonsil cancer ([place])  01/01/2020   Tonsil asymmetry 11/03/2019   Abnormal chest x-ray 08/12/2019   Encounter for health maintenance examination in adult 08/12/2019   Need for influenza vaccination 08/12/2019   Chest wall pain 06/11/2019   Muscle strain 06/11/2019   Abnormal EKG 06/11/2019   Murmur 09/05/2018   Aortic atherosclerosis ([place]) 08/04/2018   Elevated ferritin 08/04/2018   Other fatigue 07/14/2018   Iron disorder 07/14/2018   Primary open angle glaucoma of both eyes, mild stage 10/14/2017   Glaucoma 07/11/2017   Vaccine counseling 07/11/2017   Varicose vein of leg 07/11/2017   Need for shingles vaccine 07/11/2017    Hearing loss 06/27/2016   Medicare annual wellness visit, subsequent 06/26/2016   Osteoporosis 01/12/2016   Dyslipidemia 06/14/2015   Essential hypertension 06/14/2015   Mitral valve regurgitation 06/14/2015   Tricuspid valve insufficiency 06/14/2015   Vitamin D deficiency 06/14/2015   Rhinitis, allergic 06/14/2015   Renal insufficiency 06/14/2015   Disorders of both mitral and tricuspid valves 03/04/2015   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 Gershon Crane   Complication of anesthesia    Diverticulosis    per 2010 colonoscopy   Dyslipidemia    Full dentures    GERD (gastroesophageal reflux disease)    hx/o, resolved   Glaucoma 2016   H/O echocardiogram 06/22/2010   normal LVEF, moderate mitral and tricuspid regurg 07/2016 [name];  mild aortic valve stenosis, left ventricular normal, EF 55%; Dr. Gwenlyn Found  06/2010   History of cardiovascular stress test 06/22/2010   normal bruce myocadial perfusion study, 72% EF; Dr. Gwenlyn Found   History of mammogram    benign bilat calcifications, heterogeneously dense breasts, stable mammograms 2009-2014   History of uterine cancer 12/2002   s/p TAH and BSO, pelvic and periarotic lymphadenectomy 12/2002, no adjuvant therapy; stage 1b carcinosarcoma of uterus, completed 5 years of f/u as of 2008; Dr. Marti Sleigh gyencology [facility] [place]; Dr. 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 Rollene Fare   Osteoporosis    PONV (postoperative nausea and vomiting)    SVT (supraventricular tachycardia)    asymptomatic 2014, hx/o SVT, Dr. Rollene Fare   Vitamin D deficiency     Family History  Family history unknown: Yes    Past  Surgical History:  Procedure Laterality Date   ABDOMINAL HYSTERECTOMY Bilateral    h/o uterine cancer; oophrectomy   CATARACT EXTRACTION     bilat   COLONOSCOPY  08/2016   08/2016  Dr. Benson Norway advised no repeat colonoscopy;  scattered diverticla, medium hemorrhoids 2010; Dr. Benson Norway   JOINT REPLACEMENT     NM MYOCAR PERF WALL MOTION     TONSILLECTOMY Right 12/28/2019   Procedure: TONSILLECTOMY;  Surgeon: Melida Quitter, MD;  Location: [facility];  Service: ENT;  Laterality: Right;   TOTAL HIP ARTHROPLASTY Bilateral 2008, 2010    twice left (complication on the left, once on the right; Dr. Alphonzo Severance   TRANSTHORACIC ECHOCARDIOGRAM  02/2013   ordered for SVT; EF 55-65%; calcified MV annulus, mod MR; RSVP increased; RA mildly dilated; mod TR; PA peak pressure 85mHg   Social  History   Occupational History   Not on file  Tobacco Use   Smoking status: Never   Smokeless tobacco: Never  Substance and Sexual Activity   Alcohol use: No   Drug use: No   Sexual activity: Never    Birth control/protection: Surgical    Comment: walks 3x/wk, retired, married, 2 grandchildren

## 2023-01-21 ENCOUNTER — Telehealth: Payer: Self-pay

## 2023-01-21 NOTE — Telephone Encounter (Signed)
Referrals placed.  

## 2023-01-21 NOTE — Telephone Encounter (Signed)
-----   Message from Meredith Pel, MD sent at 01/20/2023  7:33 AM EST ----- Hi [name] can you order CT pelvis to evaluate right-sided osteolysis as well as MRI lumbar spine to evaluate left-sided radiculopathy.  Thanks

## 2023-01-25 ENCOUNTER — Other Ambulatory Visit: Payer: Self-pay

## 2023-01-25 DIAGNOSIS — M25551 Pain in right hip: Secondary | ICD-10-CM

## 2023-01-25 NOTE — Progress Notes (Signed)
Placed CT right hip per Dr Forbes Cellar request.

## 2023-01-28 ENCOUNTER — Encounter: Payer: Self-pay | Admitting: Nurse Practitioner

## 2023-01-28 ENCOUNTER — Ambulatory Visit: Payer: Medicare PPO | Attending: Nurse Practitioner | Admitting: Nurse Practitioner

## 2023-01-28 VITALS — BP 138/84 | HR 72 | Ht 61.0 in | Wt 139.8 lb

## 2023-01-28 DIAGNOSIS — I071 Rheumatic tricuspid insufficiency: Secondary | ICD-10-CM

## 2023-01-28 DIAGNOSIS — M79606 Pain in leg, unspecified: Secondary | ICD-10-CM

## 2023-01-28 DIAGNOSIS — I471 Supraventricular tachycardia, unspecified: Secondary | ICD-10-CM

## 2023-01-28 DIAGNOSIS — I10 Essential (primary) hypertension: Secondary | ICD-10-CM | POA: Diagnosis not present

## 2023-01-28 DIAGNOSIS — I340 Nonrheumatic mitral (valve) insufficiency: Secondary | ICD-10-CM | POA: Diagnosis not present

## 2023-01-28 DIAGNOSIS — R9439 Abnormal result of other cardiovascular function study: Secondary | ICD-10-CM | POA: Diagnosis not present

## 2023-01-28 DIAGNOSIS — E785 Hyperlipidemia, unspecified: Secondary | ICD-10-CM | POA: Diagnosis not present

## 2023-01-28 DIAGNOSIS — Z79899 Other long term (current) drug therapy: Secondary | ICD-10-CM | POA: Diagnosis not present

## 2023-01-28 LAB — BASIC METABOLIC PANEL
BUN/Creatinine Ratio: 19 (ref 12–28)
BUN: 21 mg/dL (ref 8–27)
CO2: 23 mmol/L (ref 20–29)
Calcium: 10 mg/dL (ref 8.7–10.3)
Chloride: 106 mmol/L (ref 96–106)
Creatinine, Ser: 1.09 mg/dL — ABNORMAL HIGH (ref 0.57–1.00)
Glucose: 77 mg/dL (ref 70–99)
Potassium: 4.8 mmol/L (ref 3.5–5.2)
Sodium: 142 mmol/L (ref 134–144)
eGFR: 49 mL/min/{1.73_m2} — ABNORMAL LOW (ref >59)

## 2023-01-28 MED ORDER — LISINOPRIL 40 MG PO TABS: 40.0000 mg | ORAL_TABLET | Freq: Every day | ORAL | 3 refills | Status: AC

## 2023-01-28 NOTE — Progress Notes (Signed)
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 Gershon Crane   Complication of anesthesia    Diverticulosis    per 2010 colonoscopy   Dyslipidemia    Full dentures    GERD (gastroesophageal reflux disease)    hx/o, resolved   Glaucoma 2016   H/O echocardiogram 06/22/2010   normal LVEF, moderate mitral and tricuspid regurg 07/2016 [name];  mild aortic valve stenosis, left ventricular normal, EF 55%; Dr. Gwenlyn Found  06/2010   History of cardiovascular stress test 06/22/2010   normal bruce myocadial perfusion study, 72% EF; Dr. Gwenlyn Found   History of mammogram    benign bilat calcifications, heterogeneously dense breasts, stable mammograms 2009-2014   History of uterine cancer 12/2002   s/p TAH and BSO, pelvic and periarotic lymphadenectomy 12/2002, no adjuvant therapy; stage 1b carcinosarcoma of uterus, completed 5 years of f/u as of 2008; Dr. Marti Sleigh gyencology oncology [place]; Dr. 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 Rollene Fare   Osteoporosis    PONV (postoperative nausea and vomiting)    SVT (supraventricular tachycardia)    asymptomatic 2014, hx/o SVT, Dr. Rollene Fare   Vitamin D deficiency    Past Surgical History:  Procedure  Laterality Date   ABDOMINAL HYSTERECTOMY Bilateral    h/o uterine cancer; oophrectomy   CATARACT EXTRACTION     bilat   COLONOSCOPY  08/2016   08/2016  Dr. Benson Norway advised no repeat colonoscopy;  scattered diverticla, medium hemorrhoids 2010; Dr. Benson Norway   JOINT REPLACEMENT     NM MYOCAR PERF WALL MOTION     TONSILLECTOMY Right 12/28/2019   Procedure: TONSILLECTOMY;  Surgeon: Melida Quitter, MD;  Location: [facility];  Service: ENT;  Laterality: Right;   TOTAL HIP ARTHROPLASTY Bilateral 2008, 2010    twice left (complication on the left, once on the right; Dr. Alphonzo Severance   TRANSTHORACIC ECHOCARDIOGRAM  02/2013   ordered for SVT; EF 55-65%; calcified MV annulus, mod MR; RSVP increased; RA mildly dilated; mod TR; PA peak pressure 72mHg    Allergies  Allergies  Allergen Reactions   Other Nausea Only    "medicines with steroids in it make me nauseous"   Prednisone     Other reaction(s): vomiting     Labs/Other Studies Reviewed    The following studies were reviewed today: Echo 02/26/2022: 1. Left ventricular ejection fraction, by estimation, is 60 to 65%. The  left ventricle has normal function. The left ventricle has no regional  wall motion abnormalities. Left ventricular diastolic parameters were  normal. The average left ventricular  global longitudinal strain is -22.4 %. The global longitudinal strain is  normal.   2. Right ventricular systolic function is normal. The right ventricular  size is normal. There is mildly elevated pulmonary artery systolic  pressure. The estimated right ventricular systolic pressure is [id number] mmHg.   3. Left atrial size was moderately dilated.   4. The mitral valve is abnormal. Mild to moderate mitral valve  regurgitation.   5. Tricuspid valve regurgitation is moderate to severe.   6. The aortic valve is tricuspid. Aortic valve regurgitation is not  visualized. Aortic valve sclerosis is present, with no evidence of aortic  valve  stenosis.   7. The inferior vena cava is normal in size with greater than 50%  respiratory variability, suggesting right atrial pressure of 3 mmHg.   Comparison(s): 06/30/19: LVEF >65%. Mild MR.   LE venous dopplers 12/25/2022:  Summary:  BILATERAL:  - No evidence of deep vein thrombosis seen in the lower extremities,  bilaterally.  -  RIGHT:  - A cystic structure is found in the popliteal fossa measuring  approximately 4.6 x 1.3 x 4.3 cm.    - 8.8 x 2.4 x 3.8 cm heterogenous avascular complex mass appearing to come  from hip joint. Second complex avascular appearing structure anterior to  proximal femur measuring 3.8 x 4.8 x 2.9 cm. With history of multiple hip  replacements, these could represent seromas. If clinically indicated, alternative imaging is recommended.    LEFT:  - A cystic structure is found in the popliteal fossa measuring  approximately 3.9 x 1.3 x 1.7 cm.      ABIs 12/25/2022: Bilateral ABIs and TBIs appear essentially unchanged compared to prior  study on 10/03/2018.    Summary:  Right: Resting right ankle-brachial index is within normal range. The  right toe-brachial index is abnormal.   Left: Resting left ankle-brachial index is within normal range. The left  toe-brachial index is abnormal.   Recent Labs: No results found for requested labs within last 365 days.  Recent Lipid Panel    Component Value Date/Time   CHOL 172 08/15/2020 0914   CHOL 158 08/12/2019 0947   TRIG 161 (H) 08/15/2020 0914   HDL 56 08/15/2020 0914   HDL 45 08/12/2019 0947   CHOLHDL 3.1 08/15/2020 0914   VLDL 26 07/11/2017 0912   LDLCALC 90 08/15/2020 0914    History of Present Illness    89 year old female with the above past medical history including PSVT/AVNRT (EKG concerning for Lown Ganong Levine syndrome), mitral valve regurgitation, tricuspid valve regurgitation, hypertension, hyperlipidemia, uterine cancer, glaucoma, and GERD.  Previously on flecainide, however, this  was discontinued.  Palpitations have been controlled on beta-blocker therapy.  Myoview in 2011 was negative for ischemia.  Echocardiogram in 2014 showed normal EF, moderate MR and TR.  Repeat echocardiogram in July 2020 showed EF greater than 65%.  Myoview in 07/2019 in the setting of chest discomfort showed EF 50%, mildly abnormal nuclear test with mild inferolateral ischemia, overall low risk.  Medical management was advised.  Most recent echo in 02/2022 showed EF 60 to 65%, no RWMA, moderate LAE, mild to moderate MR, moderate to severe TR.  She was last seen in the office on 12/18/2022 and reported a feeling of "fullness" in her feet each morning.  Lower extremity Dopplers were negative for DVT but did show a cyst in the right popliteal fossa, two avascular complex masses, follow-up with- PCP was advised.  ABIs were essentially normal.  Her BP was elevated and lisinopril was increased.  She presents today for follow-up. Since her last visit she has done well from a cardiac standpoint.  She  saw her orthopedic doctor for her ankle pain who ordered a CT scan of the pelvis as well as an MRI of the lumbar spine, which are pending.  Her BP has been well-controlled with increased lisinopril dosing. Overall, she reports feeling well.  Home Medications    Current Outpatient Medications  Medication Sig Dispense Refill   aspirin 81 MG EC tablet Take by mouth.     atenolol (TENORMIN) 50 MG tablet TAKE 1 TABLET BY MOUTH EVERY DAY 90 tablet 3   brimonidine (ALPHAGAN P) 0.1 % SOLN Place 1 drop into both eyes 2 times daily.     calcium carbonate (CALCIUM 600) 600 MG TABS tablet Take 1 tablet (600 mg total) by mouth 2 (two) times daily with a meal. 180 tablet 3   cetirizine (ZYRTEC) 10 MG tablet Take 1 tablet (10 mg total) by mouth daily as needed for allergies. 90 tablet 3   Cholecalciferol (D3 HIGH POTENCY) 25 MCG (1000 UT) capsule Take 1 capsule (1,000 Units total) by mouth daily. 90 capsule 3   latanoprost (XALATAN)  0.005 % ophthalmic solution Place 1 drop into both eyes.     levocetirizine (XYZAL) 5 MG tablet TAKE 1 TABLET BY MOUTH EVERY DAY IN THE EVENING FOR 30 DAYS     montelukast (SINGULAIR) 10 MG tablet TAKE 1 TABLET BY MOUTH EVERY DAY 90 tablet 3   simvastatin (ZOCOR) 40 MG tablet TAKE 1 TABLET BY MOUTH EVERYDAY AT BEDTIME 90 tablet 3   lisinopril (ZESTRIL) 40 MG tablet Take 1 tablet (40 mg total) by mouth daily. 90 tablet 3   No current facility-administered medications for this visit.     Review of Systems    She denies chest pain, palpitations, dyspnea, pnd, orthopnea, n, v, dizziness, syncope, edema, weight gain, or early satiety. All other systems reviewed and are otherwise negative except as noted above.   Physical Exam    VS:  BP 138/84   Pulse 72   Ht 5' 1"$  (1.549 m)   Wt 139 lb 12.8 oz (63.4 kg)   SpO2 99%   BMI 26.41 kg/m  GEN: Well nourished, well developed, in no acute distress. HEENT: normal. Neck: Supple, no JVD, carotid bruits, or masses. Cardiac: RRR, no murmurs, rubs, or gallops. No clubbing, cyanosis, edema.  Radials/DP/PT 2+ and equal bilaterally.  Respiratory:  Respirations regular and unlabored, clear to auscultation bilaterally. GI: Soft, nontender, nondistended, BS + x 4. MS: no deformity or atrophy. Skin: warm and dry, no rash. Neuro:  Strength and sensation are intact. Psych: Normal affect.  Accessory Clinical Findings    ECG personally reviewed by me today - No EKG in office today.     Lab Results  Component Value Date   WBC 3.8 08/15/2020   HGB 13.8 08/15/2020   HCT 43.5 08/15/2020   MCV 90.2 08/15/2020   PLT 191 08/15/2020   Lab Results  Component Value Date   CREATININE 0.97 (H) 08/15/2020   BUN 16 08/15/2020   NA 142 08/15/2020   K 4.7 08/15/2020   CL 105 08/15/2020   CO2 29 08/15/2020   Lab Results  Component Value Date   ALT 23 08/15/2020   AST 30 08/15/2020   ALKPHOS 61 08/12/2019   BILITOT 0.9 08/15/2020   Lab Results   Component Value Date   CHOL 172 08/15/2020   HDL 56 08/15/2020   LDLCALC 90 08/15/2020   TRIG 161 (H) 08/15/2020   CHOLHDL 3.1 08/15/2020    No results found  for: "HGBA1C"  Assessment & Plan   1. Hypertension: BP well controlled. Continue current antihypertensive regimen.   2. Lower extremity fullness: Lower extremity Dopplers were negative for DVT but did show a cyst in the right popliteal fossa, two avascular complex masses, follow-up with- PCP was advised.  ABIs were essentially normal.  Likely musculoskeletal in nature.  She is following with orthopedics, CT pelvis, MRI lumbar spine pending.  3. History of abnormal stress test: Myoview in 07/2019 in the setting of chest discomfort showed EF 50%, mildly abnormal nuclear test with mild inferolateral ischemia, overall low risk. Stable with no anginal symptoms. No indication for ischemic evaluation.   4. PSVT/AVNRT: Stable on atenolol.   5. Valvular heart disease:  Echo in 02/2022 showed EF 60 to 65%, no RWMA, moderate LAE, mild to moderate MR, moderate to severe TR. Euvolemic and well compensated on exam.  Consider repeat echo in 3 to 5 years, sooner if clinically indicated.  6. Hyperlipidemia: LDL was 88 in 08/2022.  Continue simvastatin.   7. Disposition: Follow-up in 6 months.      Lenna Sciara, NP 01/28/2023, 8:50 AM

## 2023-01-28 NOTE — Patient Instructions (Addendum)
Medication Instructions:  Your physician recommends that you continue on your current medications as directed. Please refer to the Current Medication list given to you today.  *If you need a refill on your cardiac medications before your next appointment, please call your pharmacy*   Lab Work: Complete lab work today. Order previously placed  If you have labs (blood work) drawn today and your tests are completely normal, you will receive your results only by: [place] (if you have MyChart) OR A paper copy in the mail If you have any lab test that is abnormal or we need to change your treatment, we will call you to review the results.   Testing/Procedures: NONE ordered at this time of appointment     Follow-Up: At [facility], you and your health needs are our priority.  As part of our continuing mission to provide you with exceptional heart care, we have created designated Provider Care Teams.  These Care Teams include your primary Cardiologist (physician) and Advanced Practice Providers (APPs -  Physician Assistants and Nurse Practitioners) who all work together to provide you with the care you need, when you need it.  We recommend signing up for the patient portal called "MyChart".  Sign up information is provided on this After Visit Summary.  MyChart is used to connect with patients for Virtual Visits (Telemedicine).  Patients are able to view lab/test results, encounter notes, upcoming appointments, etc.  Non-urgent messages can be sent to your provider as well.   To learn more about what you can do with MyChart, go to [email or web address].    Your next appointment:   6 month(s)  Provider:   Pixie Casino, MD     Other Instructions

## 2023-02-18 ENCOUNTER — Ambulatory Visit
Admission: RE | Admit: 2023-02-18 | Discharge: 2023-02-18 | Disposition: A | Discharge status: Home / Self Care | Payer: Medicare PPO | Source: Ambulatory Visit | Attending: Orthopedic Surgery | Admitting: Orthopedic Surgery

## 2023-02-18 ENCOUNTER — Inpatient Hospital Stay: Admission: RE | Admit: 2023-02-18 | Payer: Medicare PPO | Source: Ambulatory Visit

## 2023-02-18 DIAGNOSIS — M47816 Spondylosis without myelopathy or radiculopathy, lumbar region: Secondary | ICD-10-CM | POA: Diagnosis not present

## 2023-02-18 DIAGNOSIS — M79605 Pain in left leg: Secondary | ICD-10-CM | POA: Diagnosis not present

## 2023-02-18 DIAGNOSIS — M545 Low back pain, unspecified: Secondary | ICD-10-CM | POA: Diagnosis not present

## 2023-02-18 DIAGNOSIS — M4316 Spondylolisthesis, lumbar region: Secondary | ICD-10-CM | POA: Diagnosis not present

## 2023-02-18 DIAGNOSIS — Z471 Aftercare following joint replacement surgery: Secondary | ICD-10-CM | POA: Diagnosis not present

## 2023-02-18 DIAGNOSIS — Z96643 Presence of artificial hip joint, bilateral: Secondary | ICD-10-CM | POA: Diagnosis not present

## 2023-02-18 DIAGNOSIS — M48061 Spinal stenosis, lumbar region without neurogenic claudication: Secondary | ICD-10-CM | POA: Diagnosis not present

## 2023-02-19 NOTE — Progress Notes (Signed)
Hi [name] his she had her CT scan yet of the right hip?  Thanks

## 2023-02-20 NOTE — Progress Notes (Signed)
Pls have her see xu this week I talked with him thx or early next week

## 2023-02-25 DIAGNOSIS — I38 Endocarditis, valve unspecified: Secondary | ICD-10-CM | POA: Diagnosis not present

## 2023-02-25 DIAGNOSIS — N1831 Chronic kidney disease, stage 3a: Secondary | ICD-10-CM | POA: Diagnosis not present

## 2023-02-25 DIAGNOSIS — I10 Essential (primary) hypertension: Secondary | ICD-10-CM | POA: Diagnosis not present

## 2023-02-25 DIAGNOSIS — M5136 Other intervertebral disc degeneration, lumbar region: Secondary | ICD-10-CM | POA: Diagnosis not present

## 2023-02-25 DIAGNOSIS — M48061 Spinal stenosis, lumbar region without neurogenic claudication: Secondary | ICD-10-CM | POA: Diagnosis not present

## 2023-02-25 DIAGNOSIS — M25551 Pain in right hip: Secondary | ICD-10-CM | POA: Diagnosis not present

## 2023-02-25 NOTE — Progress Notes (Signed)
I called pls set up with fn for lsp esi thx No sxs right hip but seeing xu tommorrw

## 2023-02-26 ENCOUNTER — Other Ambulatory Visit: Payer: Self-pay

## 2023-02-26 ENCOUNTER — Ambulatory Visit: Payer: Medicare PPO | Admitting: Orthopaedic Surgery

## 2023-02-26 DIAGNOSIS — M25551 Pain in right hip: Secondary | ICD-10-CM | POA: Diagnosis not present

## 2023-02-26 DIAGNOSIS — M79605 Pain in left leg: Secondary | ICD-10-CM

## 2023-02-26 NOTE — Progress Notes (Signed)
Office Visit Note   Patient: [name]           Date of Birth: 07/12/1933           MRN: [id number] Visit Date: 02/26/2023              Requested by: Emma Jewel, MD [address] [name],  [name] [id number] PCP: Emma Jewel, MD   Assessment & Plan: Visit Diagnoses:  1. Pain in right hip     Plan: 89 year old female with relatively asymptomatic right metal-on-metal hip arthroplasty.  Her recent CT scan shows findings consistent with particle disease.  Will order a [facility] MRI to evaluate for pseudotumor, bone scan to rule out loosening and serum cobalt and chromium levels.  She will follow-up with me after the studies.  Follow-Up Instructions: No follow-ups on file.   Orders:  Orders Placed This Encounter  Procedures   NM Bone Scan 3 Phase   MR Hip Right w/o contrast   [place], Serum/Plasma   Chromium level   No orders of the defined types were placed in this encounter.     Procedures: No procedures performed   Clinical Data: No additional findings.   Subjective: Chief Complaint  Patient presents with   Left Hip - Pain    HPI  [name] is a very pleasant 89 year old female referral from Dr. Marlou [name] for evaluation of metal-on-metal right hip replacement.  This was done several years ago.  Dr. Marlou [name] has been monitoring this clinically and with radiographic studies.  Patient reports minimal symptoms to the right hip.  She has occasional radiating pain down the leg.  She reports more symptoms on the lateral side of her left hip.  Review of Systems   Objective: Vital Signs: There were no vitals taken for this visit.  Physical Exam  Ortho Exam  Examination of right hip shows a fully healed surgical scar.  She has fluid painless range of motion of the hip.  No sciatic tension signs.  Slight tenderness to the lumbar spine.  Specialty Comments:  EXAM: MRI LUMBAR SPINE WITHOUT CONTRAST   TECHNIQUE: Multiplanar, multisequence MR  imaging of the lumbar spine was performed. No intravenous contrast was administered.   COMPARISON:  Lumbar spine MRI 10/25/2018.   FINDINGS: Segmentation: Conventional numbering is assumed with 5 non-rib-bearing, lumbar type vertebral bodies.   Alignment: Levo scoliotic curvature of the lumbar spine. Unchanged grade 1 anterolisthesis of L3 on L4 with trace retrolisthesis of L2 on L3.   Vertebrae: Heterogeneous marrow signal. Modic type 1 degenerative endplate marrow signal changes at L4-5, unchanged.   Conus medullaris and cauda equina: Conus extends to the L2-3 level. Conus and cauda equina appear normal.   Paraspinal and other soft tissues: Fatty atrophy of the paraspinal muscles.   Disc levels:   T12-L1: Disc bulge and facet arthropathy results in mild spinal canal stenosis, new from prior. No neural foraminal narrowing.   L1-L2: Small disc bulge and endplate osteophytes without significant spinal canal stenosis. Facet arthropathy contributes to moderate right neural foraminal narrowing, unchanged.   L2-L3: Small disc osteophyte complex without significant spinal canal stenosis. Moderate disc height loss and facet arthropathy results in severe right and moderate left neural foraminal narrowing, unchanged.   L3-L4: Right eccentric disc bulge and facet arthropathy results in mild spinal canal stenosis and moderate right neural foraminal narrowing, unchanged.   L4-L5: Left eccentric disc bulge and facet arthropathy results in compression of the traversing left  L5 nerve root in the lateral recess, worse from prior. Unchanged mild bilateral neural foraminal narrowing.   L5-S1: Left eccentric disc bulge and facet arthropathy results in compression of the traversing left S1 nerve root in the lateral recess, worse from prior. Unchanged mild left neural foraminal narrowing.   IMPRESSION: 1. Slight progression of lumbar spondylosis with new compression of the traversing  left L5 and S1 nerve roots in the lateral recesses at L4-5 and L5-S1, respectively. 2. New mild spinal canal stenosis at T12-L1. 3. Otherwise unchanged moderate to severe neural foraminal narrowing at multiple levels, as described above.     Electronically Signed   By: [name] M.D.   On: 02/19/2023 09:14  Imaging: No results [name].   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   Tonsil asymmetry 11/03/2019   Abnormal chest x-ray 08/12/2019   Encounter for health maintenance examination in adult 08/12/2019   Need for influenza vaccination 08/12/2019   Chest wall pain 06/11/2019   Muscle strain 06/11/2019   Abnormal EKG 06/11/2019   Murmur 09/05/2018   Aortic atherosclerosis ([place]) 08/04/2018   Elevated ferritin 08/04/2018   Other fatigue 07/14/2018   Iron disorder 07/14/2018   Primary open angle glaucoma of both eyes, mild stage 10/14/2017   Glaucoma 07/11/2017   Vaccine counseling 07/11/2017   Varicose vein of leg 07/11/2017   Need for shingles vaccine 07/11/2017   Hearing loss 06/27/2016   Medicare annual wellness visit, subsequent 06/26/2016   Osteoporosis 01/12/2016   Dyslipidemia 06/14/2015   Essential hypertension 06/14/2015   Mitral valve regurgitation 06/14/2015   Tricuspid valve insufficiency 06/14/2015   Vitamin D deficiency 06/14/2015   Rhinitis, allergic 06/14/2015   Renal insufficiency 06/14/2015   Disorders of both mitral and tricuspid valves 03/04/2015   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 Gershon [name]   Complication of anesthesia    Diverticulosis    per 2010 colonoscopy   Dyslipidemia    Full dentures    GERD (gastroesophageal reflux disease)     hx/o, resolved   Glaucoma 2016   H/O echocardiogram 06/22/2010   normal LVEF, moderate mitral and tricuspid regurg 07/2016 Dr. Debara [name];  mild aortic valve stenosis, left ventricular normal, EF 55%; Dr. Gwenlyn [name]  06/2010   History of cardiovascular stress test 06/22/2010   normal bruce myocadial perfusion study, 72% EF; Dr. Gwenlyn [name]   History of mammogram    benign bilat calcifications, heterogeneously dense breasts, stable mammograms 2009-2014   History of uterine cancer 12/2002   s/p TAH and BSO, pelvic and periarotic lymphadenectomy 12/2002, no adjuvant therapy; stage 1b carcinosarcoma of uterus, completed 5 years of f/u as of 2008; Dr. Marti Sleigh gyencology oncology [place]; Dr. 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 Rollene Fare   Osteoporosis    PONV (postoperative nausea and vomiting)    SVT (supraventricular tachycardia)    asymptomatic 2014, hx/o SVT, Dr. Rollene Fare   Vitamin D deficiency     Family History  Family history unknown: Yes  Past Surgical History:  Procedure Laterality Date   ABDOMINAL HYSTERECTOMY Bilateral    h/o uterine cancer; oophrectomy   CATARACT EXTRACTION     bilat   COLONOSCOPY  08/2016   08/2016  Dr. Benson Norway advised no repeat colonoscopy;  scattered diverticla, medium hemorrhoids 2010; Dr. Benson Norway   JOINT REPLACEMENT     NM MYOCAR PERF WALL MOTION     TONSILLECTOMY Right 12/28/2019   Procedure: TONSILLECTOMY;  Surgeon: Melida Quitter, MD;  Location: [facility];  Service: ENT;  Laterality: Right;   TOTAL HIP ARTHROPLASTY Bilateral 2008, 2010    twice left (complication on the left, once on the right; Dr. Alphonzo Severance   TRANSTHORACIC ECHOCARDIOGRAM  02/2013   ordered for SVT; EF 55-65%; calcified MV annulus, mod MR; RSVP increased; RA mildly dilated; mod TR; PA peak pressure 41mHg   Social History   Occupational History    Not on file  Tobacco Use   Smoking status: Never   Smokeless tobacco: Never  Substance and Sexual Activity   Alcohol use: No   Drug use: No   Sexual activity: Never    Birth control/protection: Surgical    Comment: walks 3x/wk, retired, married, 2 grandchildren

## 2023-03-04 DIAGNOSIS — M25551 Pain in right hip: Secondary | ICD-10-CM | POA: Diagnosis not present

## 2023-03-05 ENCOUNTER — Telehealth: Payer: Self-pay | Admitting: Orthopaedic Surgery

## 2023-03-05 NOTE — Telephone Encounter (Signed)
Patient is questioning he 2nd MRI and wanting to speak to a techninian

## 2023-03-07 LAB — CHROMIUM LEVEL: Chromium: 6.7 mcg/L — ABNORMAL HIGH (ref <=1.2)

## 2023-03-07 LAB — COBALT, SERUM/PLASMA: Cobalt, Serum/Plasma: 21.1 mcg/L — ABNORMAL HIGH (ref 0.1–0.4)

## 2023-03-07 NOTE — Progress Notes (Signed)
I saw an appt with newton on 03/19/23.  Not sure why.  She's supposed to f/u with me once she's had her bone scan and MRI.  Thanks.

## 2023-03-08 ENCOUNTER — Encounter (HOSPITAL_COMMUNITY)
Admission: RE | Admit: 2023-03-08 | Discharge: 2023-03-08 | Disposition: A | Discharge status: Home / Self Care | Payer: Medicare PPO | Source: Ambulatory Visit | Attending: Orthopaedic Surgery | Admitting: Orthopaedic Surgery

## 2023-03-08 DIAGNOSIS — M25551 Pain in right hip: Secondary | ICD-10-CM | POA: Insufficient documentation

## 2023-03-08 MED ORDER — TECHNETIUM TC 99M MEDRONATE IV KIT
22.0000 | PACK | Freq: Once | INTRAVENOUS | Status: AC | PRN
  Administered 2023-03-08: 22 via INTRAVENOUS

## 2023-03-19 ENCOUNTER — Other Ambulatory Visit: Payer: Self-pay

## 2023-03-19 ENCOUNTER — Telehealth: Payer: Self-pay | Admitting: Orthopaedic Surgery

## 2023-03-19 ENCOUNTER — Ambulatory Visit: Payer: Medicare PPO | Admitting: Physical Medicine and Rehabilitation

## 2023-03-19 VITALS — BP 183/93 | HR 73

## 2023-03-19 DIAGNOSIS — M5416 Radiculopathy, lumbar region: Secondary | ICD-10-CM | POA: Diagnosis not present

## 2023-03-19 MED ORDER — METHYLPREDNISOLONE ACETATE 80 MG/ML IJ SUSP
80.0000 mg | Freq: Once | INTRAMUSCULAR | Status: AC
  Administered 2023-03-19: 80 mg

## 2023-03-19 NOTE — Telephone Encounter (Signed)
Patient wanting to speak to [facility] about her results.

## 2023-03-19 NOTE — Progress Notes (Signed)
Functional Pain Scale - descriptive words and definitions  Moderate (4)   Constantly aware of pain, can complete ADLs with modification/sleep marginally affected at times/passive distraction is of no use, but active distraction gives some relief. Moderate range order  Average Pain varies depending on activity   +Driver, -BT, -Dye Allergies.  Lower back pain all the way across that can radiate to the leg

## 2023-03-19 NOTE — Patient Instructions (Signed)

## 2023-03-20 NOTE — Telephone Encounter (Signed)
I called and spoke with patient. She had a bone scan and lab work done and states that she never received a call with the results. Were you going to call patient or does she need return appointment?

## 2023-03-20 NOTE — Telephone Encounter (Signed)
Please see below. Message originally sent to me. I will be glad to call her and schedule appointment, but need to know where you would like for me to put her.  Do you want her to wait for your next available?

## 2023-03-20 NOTE — Telephone Encounter (Signed)
Called and scheduled patient

## 2023-03-20 NOTE — Telephone Encounter (Signed)
I was going to go over everything with her at her follow-up appointment

## 2023-03-27 ENCOUNTER — Ambulatory Visit: Payer: Medicare PPO | Admitting: Orthopaedic Surgery

## 2023-03-27 DIAGNOSIS — T84010A Broken internal right hip prosthesis, initial encounter: Secondary | ICD-10-CM

## 2023-03-27 NOTE — Progress Notes (Signed)
Office Visit Note   Patient: [name]           Date of Birth: [birth year]           MRN: [id number] Visit Date: 03/27/2023              Requested by: Ollen Bowl, MD [address] [place],  [place] [address] PCP: Ollen Bowl, MD   Assessment & Plan: Visit Diagnoses:  1. 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  She has an ASR cup.  Metal ion levels are significantly elevated.  There is no evidence of loosening of the implants.  She does have osteolysis and medial migration of the ASR cup.  No evidence of infection.  At this point I have recommended revision of the ASR cup and metal-on-metal bearing to the standard bearing.  Risk benefits prognosis discussed including infection, dislocation, leg length discrepancy.  The patient would like to postpone the surgery until her kids are able to help her postoperatively.  This will likely be sometime in June or July.  In the meantime we will get preoperative clearance.  Follow-Up Instructions: No follow-ups on file.   Orders:  No orders of the defined types were placed in this encounter.  No orders of the defined types were placed in this encounter.     Procedures: No procedures performed   Clinical Data: No additional findings.   Subjective: Chief Complaint  Patient presents with   Right Hip - Follow-up    HPI  Patient returns today for follow up on her right metal on metal total hip replacement  Review of Systems   Objective: Vital Signs: There were no vitals taken for this visit.  Physical Exam  Ortho Exam  Examination right hip shows a fully healed posterior lateral surgical scar.  She has good range of motion without significant pain.  Specialty Comments:  EXAM: MRI LUMBAR SPINE WITHOUT CONTRAST   TECHNIQUE: Multiplanar, multisequence  MR imaging of the lumbar spine was performed. No intravenous contrast was administered.   COMPARISON:  Lumbar spine MRI 10/25/2018.   FINDINGS: Segmentation: Conventional numbering is assumed with 5 non-rib-bearing, lumbar type vertebral bodies.   Alignment: Levo scoliotic curvature of the lumbar spine. Unchanged grade 1 anterolisthesis of L3 on L4 with trace retrolisthesis of L2 on L3.   Vertebrae: Heterogeneous marrow signal. Modic type 1 degenerative endplate marrow signal changes at L4-5, unchanged.   Conus medullaris and cauda equina: Conus extends to the L2-3 level. Conus and cauda equina appear normal.   Paraspinal and other soft tissues: Fatty atrophy of the paraspinal muscles.   Disc levels:   T12-L1: Disc bulge and facet arthropathy results in mild spinal canal stenosis, new from prior. No neural foraminal narrowing.   L1-L2: Small disc bulge and endplate osteophytes without significant spinal canal stenosis. Facet arthropathy contributes to moderate right neural foraminal narrowing, unchanged.   L2-L3: Small disc osteophyte complex without significant spinal canal stenosis. Moderate disc height loss and facet arthropathy results in severe right and moderate left neural foraminal narrowing, unchanged.   L3-L4: Right eccentric disc bulge and facet arthropathy results in mild spinal canal stenosis and moderate right neural foraminal narrowing, unchanged.   L4-L5: Left eccentric disc bulge and facet arthropathy results in compression of the traversing left L5 nerve root in the lateral  recess, worse from prior. Unchanged mild bilateral neural foraminal narrowing.   L5-S1: Left eccentric disc bulge and facet arthropathy results in compression of the traversing left S1 nerve root in the lateral recess, worse from prior. Unchanged mild left neural foraminal narrowing.   IMPRESSION: 1. Slight progression of lumbar spondylosis with new compression of the  traversing left L5 and S1 nerve roots in the lateral recesses at L4-5 and L5-S1, respectively. 2. New mild spinal canal stenosis at T12-L1. 3. Otherwise unchanged moderate to severe neural foraminal narrowing at multiple levels, as described above.     Electronically Signed   By: Orvan Falconer M.D.   On: 02/19/2023 09:14  Imaging: No results found.   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   Tonsil asymmetry 11/03/2019   Abnormal chest x-ray 08/12/2019   Encounter for health maintenance examination in adult 08/12/2019   Need for influenza vaccination 08/12/2019   Chest wall pain 06/11/2019   Muscle strain 06/11/2019   Abnormal EKG 06/11/2019   Murmur 09/05/2018   Aortic atherosclerosis 08/04/2018   Elevated ferritin 08/04/2018   Other fatigue 07/14/2018   Iron disorder 07/14/2018   Primary open angle glaucoma of both eyes, mild stage 10/14/2017   Glaucoma 07/11/2017   Vaccine counseling 07/11/2017   Varicose vein of leg 07/11/2017   Need for shingles vaccine 07/11/2017   Hearing loss 06/27/2016   Medicare annual wellness visit, subsequent 06/26/2016   Osteoporosis 01/12/2016   Dyslipidemia 06/14/2015   Essential hypertension 06/14/2015   Mitral valve regurgitation 06/14/2015   Tricuspid valve insufficiency 06/14/2015   Vitamin D deficiency 06/14/2015   Rhinitis, allergic 06/14/2015   Renal insufficiency 06/14/2015   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 Nile Riggs   Complication of anesthesia    Diverticulosis    per 2010 colonoscopy   Dyslipidemia    Full dentures    GERD (gastroesophageal reflux disease)     hx/o, resolved   Glaucoma 2016   H/O echocardiogram 06/22/2010   normal LVEF, moderate mitral and tricuspid regurg 07/2016 Dr. Rennis Golden;  mild aortic valve stenosis, left ventricular normal, EF 55%; Dr. Allyson Sabal  06/2010   History of cardiovascular stress test 06/22/2010   normal bruce myocadial perfusion study, 72% EF; Dr. Allyson Sabal   History of mammogram    benign bilat calcifications, heterogeneously dense breasts, stable mammograms 2009-2014   History of uterine cancer 12/2002   s/p TAH and BSO, pelvic and periarotic lymphadenectomy 12/2002, no adjuvant therapy; stage 1b carcinosarcoma of uterus, completed 5 years of f/u as of 2008; Dr. De Blanch gyencology oncology [place]; Dr. 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 Alanda Amass   Osteoporosis    PONV (postoperative nausea and vomiting)    SVT (supraventricular tachycardia)    asymptomatic 2014, hx/o SVT, Dr. Alanda Amass   Vitamin D deficiency     Family History  Family history unknown: Yes    Past Surgical History:  Procedure Laterality Date  ABDOMINAL HYSTERECTOMY Bilateral    h/o uterine cancer; oophrectomy   CATARACT EXTRACTION     bilat   COLONOSCOPY  08/2016   08/2016  Dr. Elnoria HowardHung advised no repeat colonoscopy;  scattered diverticla, medium hemorrhoids 2010; Dr. Elnoria HowardHung   JOINT REPLACEMENT     NM MYOCAR PERF WALL MOTION     TONSILLECTOMY Right 12/28/2019   Procedure: TONSILLECTOMY;  Surgeon: Christia ReadingBates, Dwight, MD;  Location: [facility];  Service: ENT;  Laterality: Right;   TOTAL HIP ARTHROPLASTY Bilateral 2008, 2010    twice left (complication on the left, once on the right; Dr. Dorene GrebeScott Dean   TRANSTHORACIC ECHOCARDIOGRAM  02/2013   ordered for SVT; EF 55-65%; calcified MV annulus, mod MR; RSVP increased; RA mildly dilated; mod TR; PA peak pressure 32mmHg   Social History   Occupational History    Not on file  Tobacco Use   Smoking status: Never   Smokeless tobacco: Never  Substance and Sexual Activity   Alcohol use: No   Drug use: No   Sexual activity: Never    Birth control/protection: Surgical    Comment: walks 3x/wk, retired, married, 2 grandchildren

## 2023-03-29 ENCOUNTER — Telehealth: Payer: Self-pay | Admitting: Orthopaedic Surgery

## 2023-03-29 NOTE — Telephone Encounter (Signed)
Patient would like to schedule right total hip surgery, but would to discuss the date with her son Reita Cliche.  I called Bobby at [phone] and left message on voicemail providing my name and direct number for scheduling.

## 2023-04-01 DIAGNOSIS — H401131 Primary open-angle glaucoma, bilateral, mild stage: Secondary | ICD-10-CM | POA: Diagnosis not present

## 2023-04-01 NOTE — Procedures (Signed)
Lumbar Epidural Steroid Injection - Interlaminar Approach with Fluoroscopic Guidance  Patient: [name]      Date of Birth: Apr 23, 1933 MRN: [id number] PCP: Ollen Bowl, MD      Visit Date: 03/19/2023   Universal Protocol:     Consent Given By: the patient  Position: PRONE  Additional Comments: Vital signs were monitored before and after the procedure. Patient was prepped and draped in the usual sterile fashion. The correct patient, procedure, and site was verified.   Injection Procedure Details:   Procedure diagnoses: Lumbar radiculopathy [M54.16]   Meds Administered:  Meds ordered this encounter  Medications   methylPREDNISolone acetate (DEPO-MEDROL) injection 80 mg     Laterality: Left  Location/Site:  L5-S1  Needle: 3.5 in., 20 ga. Tuohy  Needle Placement: Paramedian epidural  Findings:   -Comments: Excellent flow of contrast into the epidural space.  Procedure Details: Using a paramedian approach from the side mentioned above, the region overlying the inferior lamina was localized under fluoroscopic visualization and the soft tissues overlying this structure were infiltrated with 4 ml. of 1% Lidocaine without Epinephrine. The Tuohy needle was inserted into the epidural space using a paramedian approach.   The epidural space was localized using loss of resistance along with counter oblique bi-planar fluoroscopic views.  After negative aspirate for air, blood, and CSF, a 2 ml. volume of Isovue-250 was injected into the epidural space and the flow of contrast was observed. Radiographs were obtained for documentation purposes.    The injectate was administered into the level noted above.   Additional Comments:  No complications occurred Dressing: 2 x 2 sterile gauze and Band-Aid    Post-procedure details: Patient was observed during the procedure. Post-procedure instructions were reviewed.  Patient left the clinic in stable condition.

## 2023-04-01 NOTE — Progress Notes (Signed)
LIBNI FUSARO - 89 y.o. female MRN [id number]  Date of birth: Mar 02, 1933  Office Visit Note: Visit Date: 03/19/2023 PCP: Ollen Bowl, MD Referred by: Ollen Bowl, MD  Subjective: Chief Complaint  Patient presents with   Lower Back - Pain   HPI:  HILARIA TITSWORTH is a 89 y.o. female who comes in today at the request of Dr. Burnard Bunting for planned Left L5-S1 Lumbar Interlaminar epidural steroid injection with fluoroscopic guidance.  The patient has failed conservative care including home exercise, medications, time and activity modification.  This injection will be diagnostic and hopefully therapeutic.  Please see requesting physician notes for further details and justification.   ROS Otherwise per HPI.  Assessment & Plan: Visit Diagnoses:    ICD-10-CM   1. Lumbar radiculopathy  M54.16 XR C-ARM NO REPORT    Epidural Steroid injection    methylPREDNISolone acetate (DEPO-MEDROL) injection 80 mg      Plan: No additional findings.   Meds & Orders:  Meds ordered this encounter  Medications   methylPREDNISolone acetate (DEPO-MEDROL) injection 80 mg    Orders Placed This Encounter  Procedures   XR C-ARM NO REPORT   Epidural Steroid injection    Follow-up: Return for visit to requesting provider as needed.   Procedures: No procedures performed  Lumbar Epidural Steroid Injection - Interlaminar Approach with Fluoroscopic Guidance  Patient: AVRY MONTELEONE      Date of Birth: 1933/07/29 MRN: [id number] PCP: Ollen Bowl, MD      Visit Date: 03/19/2023   Universal Protocol:     Consent Given By: the patient  Position: PRONE  Additional Comments: Vital signs were monitored before and after the procedure. Patient was prepped and draped in the usual sterile fashion. The correct patient, procedure, and site was verified.   Injection Procedure Details:   Procedure diagnoses: Lumbar radiculopathy [M54.16]   Meds Administered:  Meds ordered this  encounter  Medications   methylPREDNISolone acetate (DEPO-MEDROL) injection 80 mg     Laterality: Left  Location/Site:  L5-S1  Needle: 3.5 in., 20 ga. Tuohy  Needle Placement: Paramedian epidural  Findings:   -Comments: Excellent flow of contrast into the epidural space.  Procedure Details: Using a paramedian approach from the side mentioned above, the region overlying the inferior lamina was localized under fluoroscopic visualization and the soft tissues overlying this structure were infiltrated with 4 ml. of 1% Lidocaine without Epinephrine. The Tuohy needle was inserted into the epidural space using a paramedian approach.   The epidural space was localized using loss of resistance along with counter oblique bi-planar fluoroscopic views.  After negative aspirate for air, blood, and CSF, a 2 ml. volume of Isovue-250 was injected into the epidural space and the flow of contrast was observed. Radiographs were obtained for documentation purposes.    The injectate was administered into the level noted above.   Additional Comments:  No complications occurred Dressing: 2 x 2 sterile gauze and Band-Aid    Post-procedure details: Patient was observed during the procedure. Post-procedure instructions were reviewed.  Patient left the clinic in stable condition.   Clinical History: EXAM: MRI LUMBAR SPINE WITHOUT CONTRAST   TECHNIQUE: Multiplanar, multisequence MR imaging of the lumbar spine was performed. No intravenous contrast was administered.   COMPARISON:  Lumbar spine MRI 10/25/2018.   FINDINGS: Segmentation: Conventional numbering is assumed with 5 non-rib-bearing, lumbar type vertebral bodies.   Alignment: Levo scoliotic curvature of the lumbar spine. Unchanged grade 1  anterolisthesis of L3 on L4 with trace retrolisthesis of L2 on L3.   Vertebrae: Heterogeneous marrow signal. Modic type 1 degenerative endplate marrow signal changes at L4-5, unchanged.   Conus  medullaris and cauda equina: Conus extends to the L2-3 level. Conus and cauda equina appear normal.   Paraspinal and other soft tissues: Fatty atrophy of the paraspinal muscles.   Disc levels:   T12-L1: Disc bulge and facet arthropathy results in mild spinal canal stenosis, new from prior. No neural foraminal narrowing.   L1-L2: Small disc bulge and endplate osteophytes without significant spinal canal stenosis. Facet arthropathy contributes to moderate right neural foraminal narrowing, unchanged.   L2-L3: Small disc osteophyte complex without significant spinal canal stenosis. Moderate disc height loss and facet arthropathy results in severe right and moderate left neural foraminal narrowing, unchanged.   L3-L4: Right eccentric disc bulge and facet arthropathy results in mild spinal canal stenosis and moderate right neural foraminal narrowing, unchanged.   L4-L5: Left eccentric disc bulge and facet arthropathy results in compression of the traversing left L5 nerve root in the lateral recess, worse from prior. Unchanged mild bilateral neural foraminal narrowing.   L5-S1: Left eccentric disc bulge and facet arthropathy results in compression of the traversing left S1 nerve root in the lateral recess, worse from prior. Unchanged mild left neural foraminal narrowing.   IMPRESSION: 1. Slight progression of lumbar spondylosis with new compression of the traversing left L5 and S1 nerve roots in the lateral recesses at L4-5 and L5-S1, respectively. 2. New mild spinal canal stenosis at T12-L1. 3. Otherwise unchanged moderate to severe neural foraminal narrowing at multiple levels, as described above.     Electronically Signed   By: Orvan Falconer M.D.   On: 02/19/2023 09:14     Objective:  VS:  HT:    WT:   BMI:     BP:(!) 183/93  HR:73bpm  TEMP: ( )  RESP:  Physical Exam Vitals and nursing note reviewed.  Constitutional:      General: She is not in acute  distress.    Appearance: Normal appearance. She is not ill-appearing.  HENT:     Head: Normocephalic and atraumatic.     Right Ear: External ear normal.     Left Ear: External ear normal.  Eyes:     Extraocular Movements: Extraocular movements intact.  Cardiovascular:     Rate and Rhythm: Normal rate.     Pulses: Normal pulses.  Pulmonary:     Effort: Pulmonary effort is normal. No respiratory distress.  Abdominal:     General: There is no distension.     Palpations: Abdomen is soft.  Musculoskeletal:        General: Tenderness present.     Cervical back: Neck supple.     Right lower leg: No edema.     Left lower leg: No edema.     Comments: Patient has good distal strength with no pain over the greater trochanters.  No clonus or focal weakness.  Skin:    Findings: No erythema, lesion or rash.  Neurological:     General: No focal deficit present.     Mental Status: She is alert and oriented to person, place, and time.     Sensory: No sensory deficit.     Motor: No weakness or abnormal muscle tone.     Coordination: Coordination normal.  Psychiatric:        Mood and Affect: Mood normal.  Behavior: Behavior normal.      Imaging: No results found.

## 2023-04-25 DIAGNOSIS — I739 Peripheral vascular disease, unspecified: Secondary | ICD-10-CM | POA: Diagnosis not present

## 2023-04-25 DIAGNOSIS — H409 Unspecified glaucoma: Secondary | ICD-10-CM | POA: Diagnosis not present

## 2023-04-25 DIAGNOSIS — M810 Age-related osteoporosis without current pathological fracture: Secondary | ICD-10-CM | POA: Diagnosis not present

## 2023-04-25 DIAGNOSIS — E785 Hyperlipidemia, unspecified: Secondary | ICD-10-CM | POA: Diagnosis not present

## 2023-04-25 DIAGNOSIS — H9193 Unspecified hearing loss, bilateral: Secondary | ICD-10-CM | POA: Diagnosis not present

## 2023-04-25 DIAGNOSIS — M1990 Unspecified osteoarthritis, unspecified site: Secondary | ICD-10-CM | POA: Diagnosis not present

## 2023-04-25 DIAGNOSIS — J309 Allergic rhinitis, unspecified: Secondary | ICD-10-CM | POA: Diagnosis not present

## 2023-04-25 DIAGNOSIS — N1831 Chronic kidney disease, stage 3a: Secondary | ICD-10-CM | POA: Diagnosis not present

## 2023-04-25 DIAGNOSIS — I129 Hypertensive chronic kidney disease with stage 1 through stage 4 chronic kidney disease, or unspecified chronic kidney disease: Secondary | ICD-10-CM | POA: Diagnosis not present

## 2023-05-01 ENCOUNTER — Other Ambulatory Visit: Payer: Self-pay

## 2023-05-16 ENCOUNTER — Ambulatory Visit: Payer: Medicare PPO

## 2023-05-16 DIAGNOSIS — T84010A Broken internal right hip prosthesis, initial encounter: Secondary | ICD-10-CM | POA: Diagnosis not present

## 2023-05-17 LAB — HEMOGLOBIN A1C
Hgb A1c MFr Bld: 5.7 % of total Hgb — ABNORMAL HIGH (ref <5.7)
Mean Plasma Glucose: 117 mg/dL
eAG (mmol/L): 6.5 mmol/L

## 2023-05-17 LAB — PREALBUMIN: Prealbumin: 20 mg/dL (ref 17–34)

## 2023-05-24 NOTE — Pre-Procedure Instructions (Signed)
Surgical Instructions    Your procedure is scheduled on June 03, 2023.  Report to [facility] Main Entrance "A" at 12:45 A.M., then check in with the Admitting office.  Call this number if you have problems the morning of surgery:  [phone]   If you have any questions prior to your surgery date call [phone]: Open Monday-Friday 8am-4pm If you experience any cold or flu symptoms such as cough, fever, chills, shortness of breath, etc. between now and your scheduled surgery, please notify us at the above number     Remember:  Do not eat after midnight the night before your surgery  You may drink clear liquids until 12:00PM the morning of your surgery.   Clear liquids allowed are: Water, Non-Citrus Juices (without pulp), Carbonated Beverages, Clear Tea, Black Coffee ONLY (NO MILK, CREAM OR POWDERED CREAMER of any kind), and Gatorade   Patient Instructions  The night before surgery:  No food after midnight. ONLY clear liquids after midnight  The day of surgery (if you do NOT have diabetes):  Drink ONE (1) Pre-Surgery Clear Ensure by 12:00PM the morning of surgery. Drink in one sitting. Do not sip.  This drink was given to you during your hospital  pre-op appointment visit.  Nothing else to drink after completing the  Pre-Surgery Clear Ensure.          If you have questions, please contact your surgeon's office.   Take these medicines the morning of surgery with A SIP OF WATER:   If needed:  brimonidine (ALPHAGAN P) 0.1 % SOLN   Follow your surgeon's instructions on when to stop Aspirin.  If no instructions were given by your surgeon then you will need to call the office to get those instructions.    As of today, STOP taking any Aleve, Naproxen, Ibuprofen, Motrin, Advil, Goody's, BC's, all herbal medications, fish oil, and all vitamins.            [facility] is not responsible for any belongings or valuables.    Do NOT Smoke (Tobacco/Vaping)  24 hours prior to your  procedure  If you use a CPAP at night, you may bring your mask for your overnight stay.   Contacts, glasses, hearing aids, dentures or partials may not be worn into surgery, please bring cases for these belongings   For patients admitted to the hospital, discharge time will be determined by your treatment team.   Patients discharged the day of surgery will not be allowed to drive home, and someone needs to stay with them for 24 hours.   SURGICAL WAITING ROOM VISITATION Patients having surgery or a procedure may have no more than 2 support people in the waiting area - these visitors may rotate.   Children under the age of 79 must have an adult with them who is not the patient. If the patient needs to stay at the hospital during part of their recovery, the visitor guidelines for inpatient rooms apply. Pre-op nurse will coordinate an appropriate time for 1 support person to accompany patient in pre-op.  This support person may not rotate.   Please refer to [email or web address] for the visitor guidelines for Inpatients (after your surgery is over and you are in a regular room).    Special instructions:    Oral Hygiene is also important to reduce your risk of infection.  Remember - BRUSH YOUR TEETH THE MORNING OF SURGERY WITH YOUR REGULAR TOOTHPASTE     Pre-operative 5 CHG Bath Instructions  You can play a key role in reducing the risk of infection after surgery. Your skin needs to be as free of germs as possible. You can reduce the number of germs on your skin by washing with CHG (chlorhexidine gluconate) soap before surgery. CHG is an antiseptic soap that kills germs and continues to kill germs even after washing.   DO NOT use if you have an allergy to chlorhexidine/CHG or antibacterial soaps. If your skin becomes reddened or irritated, stop using the CHG and notify one of our RNs at [phone].   Please shower with the CHG soap  starting 4 days before surgery using the following schedule:     Please keep in mind the following:  DO NOT shave, including legs and underarms, starting the day of your first shower.   You may shave your face at any point before/day of surgery.  Place clean sheets on your bed the day you start using CHG soap. Use a clean washcloth (not used since being washed) for each shower. DO NOT sleep with pets once you start using the CHG.   CHG Shower Instructions:  If you choose to wash your hair and private area, wash first with your normal shampoo/soap.  After you use shampoo/soap, rinse your hair and body thoroughly to remove shampoo/soap residue.  Turn the water OFF and apply about 3 tablespoons (45 ml) of CHG soap to a CLEAN washcloth.  Apply CHG soap ONLY FROM YOUR NECK DOWN TO YOUR TOES (washing for 3-5 minutes)  DO NOT use CHG soap on face, private areas, open wounds, or sores.  Pay special attention to the area where your surgery is being performed.  If you are having back surgery, having someone wash your back for you may be helpful. Wait 2 minutes after CHG soap is applied, then you may rinse off the CHG soap.  Pat dry with a clean towel  Put on clean clothes/pajamas   If you choose to wear lotion, please use ONLY the CHG-compatible lotions on the back of this paper.     Additional instructions for the day of surgery: DO NOT APPLY any lotions, deodorants, cologne, or perfumes.   Put on clean/comfortable clothes.  Brush your teeth.  Ask your nurse before applying any prescription medications to the skin.      CHG Compatible Lotions   Aveeno Moisturizing lotion  Cetaphil Moisturizing Cream  Cetaphil Moisturizing Lotion  Clairol Herbal Essence Moisturizing Lotion, Dry Skin  Clairol Herbal Essence Moisturizing Lotion, Extra Dry Skin  Clairol Herbal Essence Moisturizing Lotion, Normal Skin  Curel Age Defying Therapeutic Moisturizing Lotion with Alpha Hydroxy  Curel Extreme  Care Body Lotion  Curel Soothing Hands Moisturizing Hand Lotion  Curel Therapeutic Moisturizing Cream, Fragrance-Free  Curel Therapeutic Moisturizing Lotion, Fragrance-Free  Curel Therapeutic Moisturizing Lotion, Original Formula  Eucerin Daily Replenishing Lotion  Eucerin Dry Skin Therapy Plus Alpha Hydroxy Crme  Eucerin Dry Skin Therapy Plus Alpha Hydroxy Lotion  Eucerin Original Crme  Eucerin Original Lotion  Eucerin Plus Crme Eucerin Plus Lotion  Eucerin TriLipid Replenishing Lotion  Keri Anti-Bacterial Hand Lotion  Keri Deep Conditioning Original Lotion Dry Skin Formula Softly Scented  Keri Deep Conditioning Original Lotion, Fragrance Free Sensitive Skin Formula  Keri Lotion Fast Absorbing Fragrance Free Sensitive Skin Formula  Keri Lotion Fast Absorbing Softly Scented Dry Skin Formula  Keri Original Lotion  Keri Skin Renewal Lotion Keri Silky Smooth Lotion  Keri Silky Smooth Sensitive Skin Lotion  Nivea Body Creamy Conditioning Oil  Nivea  Body Extra Enriched Teacher, adult education Moisturizing Lotion Nivea Crme  Nivea Skin Firming Lotion  NutraDerm 30 Skin Lotion  NutraDerm Skin Lotion  NutraDerm Therapeutic Skin Cream  NutraDerm Therapeutic Skin Lotion  ProShield Protective Hand Cream  Provon moisturizing lotion  Day of Surgery:  Take a shower with CHG soap. Wear Clean/Comfortable clothing the morning of surgery Do not wear jewelry or makeup. Do not wear lotions, powders, perfumes/cologne or deodorant.  Men may shave face and neck. Do not bring valuables to the hospital. Do not wear nail polish, gel polish, artificial nails, or any other type of covering on natural nails (fingers and toes) If you have artificial nails or gel coating that need to be removed by a nail salon, please have this removed prior to surgery. Artificial nails or gel coating may interfere with anesthesia's ability to adequately monitor your vital signs. Remember  to brush your teeth WITH YOUR REGULAR TOOTHPASTE.    If you received a COVID test during your pre-op visit, it is requested that you wear a mask when out in public, stay away from anyone that may not be feeling well, and notify your surgeon if you develop symptoms. If you have been in contact with anyone that has tested positive in the last 10 days, please notify your surgeon.    Please read over the following fact sheets that you were given.

## 2023-05-27 ENCOUNTER — Other Ambulatory Visit: Payer: Self-pay

## 2023-05-27 ENCOUNTER — Encounter (HOSPITAL_COMMUNITY)
Admission: RE | Admit: 2023-05-27 | Discharge: 2023-05-27 | Disposition: A | Discharge status: Home / Self Care | Payer: Medicare PPO | Source: Ambulatory Visit | Attending: Orthopaedic Surgery | Admitting: Orthopaedic Surgery

## 2023-05-27 ENCOUNTER — Encounter (HOSPITAL_COMMUNITY): Payer: Self-pay

## 2023-05-27 VITALS — BP 147/87 | HR 74 | Temp 98.0°F | Resp 18 | Ht 62.0 in | Wt 141.0 lb

## 2023-05-27 DIAGNOSIS — Z01818 Encounter for other preprocedural examination: Secondary | ICD-10-CM | POA: Diagnosis not present

## 2023-05-27 DIAGNOSIS — M25551 Pain in right hip: Secondary | ICD-10-CM | POA: Diagnosis not present

## 2023-05-27 HISTORY — DX: Personal history of pneumonia (recurrent): Z87.01

## 2023-05-27 HISTORY — DX: Malignant (primary) neoplasm, unspecified: C80.1

## 2023-05-27 LAB — TYPE AND SCREEN
ABO/RH(D): B POS
Antibody Screen: NEGATIVE

## 2023-05-27 LAB — CBC
HCT: 44 % (ref 36.0–46.0)
Hemoglobin: 14.1 g/dL (ref 12.0–15.0)
MCH: 29.6 pg (ref 26.0–34.0)
MCHC: 32 g/dL (ref 30.0–36.0)
MCV: 92.4 fL (ref 80.0–100.0)
Platelets: 198 10*3/uL (ref 150–400)
RBC: 4.76 MIL/uL (ref 3.87–5.11)
RDW: 14.6 % (ref 11.5–15.5)
WBC: 4 10*3/uL (ref 4.0–10.5)
nRBC: 0 % (ref 0.0–0.2)

## 2023-05-27 LAB — BASIC METABOLIC PANEL
Anion gap: 10 (ref 5–15)
BUN: 19 mg/dL (ref 8–23)
CO2: 25 mmol/L (ref 22–32)
Calcium: 9.6 mg/dL (ref 8.9–10.3)
Chloride: 104 mmol/L (ref 98–111)
Creatinine, Ser: 1.18 mg/dL — ABNORMAL HIGH (ref 0.44–1.00)
GFR, Estimated: 44 mL/min — ABNORMAL LOW (ref >60)
Glucose, Bld: 77 mg/dL (ref 70–99)
Potassium: 4.5 mmol/L (ref 3.5–5.1)
Sodium: 139 mmol/L (ref 135–145)

## 2023-05-27 LAB — SURGICAL PCR SCREEN
MRSA, PCR: NEGATIVE
Staphylococcus aureus: NEGATIVE

## 2023-05-27 NOTE — Progress Notes (Signed)
PCP -    Ollen Bowl, MD   Cardiologist - Zoila Shutter, MD  PPM/ICD - denies   Chest x-ray - N/A EKG - 12/18/22 Stress Test - 07/21/19 ECHO - 02/26/22 Cardiac Cath - denies  Sleep Study - denies   Fasting Blood Sugar - N/A   Last dose of GLP1 agonist-  N/A   Blood Thinner Instructions: N/A Aspirin Instructions: Follow your surgeon's instructions on when to stop Aspirin.  If no instructions were given by your surgeon then you will need to call the office to get those instructions.  Pt and son verbalized understanding need to call Dr. Warren Danes office for instructions.    ERAS Protcol - ERAS + ensure   COVID TEST- N/A   Anesthesia review: review cardiac hx and tests.   Patient denies shortness of breath, fever, cough and chest pain at PAT appointment   All instructions explained to the patient, with a verbal understanding of the material. Patient agrees to go over the instructions while at home for a better understanding.  The opportunity to ask questions was provided.

## 2023-05-28 ENCOUNTER — Telehealth: Payer: Self-pay

## 2023-05-28 ENCOUNTER — Telehealth: Payer: Self-pay | Admitting: *Deleted

## 2023-05-28 ENCOUNTER — Telehealth: Payer: Self-pay | Admitting: Radiology

## 2023-05-28 NOTE — Progress Notes (Addendum)
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  PCP Dr. Jacqulyn Bath cleared for surgery at moderate risk.   Last cardiology visit was on 01/28/23 with Monge, Emily,NP.  Echo in March 2023 showed LVEF 60-65%, no regional wall motion abnormalities, normal RVSF, RVSP 41.7 mmHg, mild-moderate MR, moderate-severe TR. Consider repeat in 3-5 years, sooner if clinically indicated. No longer on flecainide. Palpitations controlled on b-blocker. Myoview in 07/2019 showed EF 50%, mildly abnormal nuclear test with mild inferolateral ischemia, overall low risk. Medical management was advised. Denied anginal symptoms.  She developed feeling of fullness in her feet in January  No LE DVT with popliteal fossa cysts an and 2 avascular complex masses arising from the hip region by 12/25/22 [place] and was advised PCP follow-up (and ultimately had additional imaging with orthopedic follow-up). ABIs were unremarkable. Leg symptoms felt likely musculoskeletal in nature. Six month cardiology follow-up planned. She remains on atenolol.   She appeared stable per February 2024 cardiology follow-up, but given age, known valvular disease (mild-moderate MR, moderate-severe TR 2023), mildly abnormal stress test in 2020, and arrhythmia  history will ask Dr. Warren Danes staff to also contact cardiology for preoperative input.    VS: BP (!) 147/87   Pulse 74   Temp 36.7 C (Oral)   Resp 18   Ht 5\' 2"  (1.575 m)   Wt 64 kg   SpO2 100%   BMI 25.79 kg/m    PROVIDERS: Ollen Bowl, MD is PCP  Chrystie Nose, MD is cardiologist Jenne Pane. Karren Burly, MD is ENT and Wendall Mola MD (for right tonsillar SCC) ([facility])   LABS: Labs reviewed: Acceptable for surgery. (all labs ordered are listed, but only abnormal results are displayed)  Labs Reviewed  BASIC METABOLIC PANEL - Abnormal; Notable for the following components:      Result Value   Creatinine, Ser 1.18 (*)    GFR, Estimated 44 (*)    All other components within normal limits  SURGICAL PCR SCREEN  CBC  TYPE AND SCREEN    Normal spirometry 03/31/18.   IMAGES: Bone Scan 03/08/23: IMPRESSION: No scintigraphic evidence of complication of either hip prosthesis.   CT Pelvis 02/18/23: IMPRESSION: 1. Extensive osteolysis of the right acetabulum with associated soft tissue masslike lesion. There is also an area of osteolysis along the anterior aspect of the right femoral component with associated soft tissue masslike lesion. 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 2. Loss of bone along the medial wall of the left acetabulum without associated soft tissue mass. 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 3. Sigmoid diverticulosis.   MRI L-spine 02/18/23: IMPRESSION: 1. Slight progression of lumbar spondylosis with new compression of the traversing left L5 and S1 nerve roots in the lateral recesses at L4-5 and L5-S1, respectively. 2. New mild spinal canal stenosis at T12-L1. 3. Otherwise unchanged moderate to severe neural  foraminal narrowing at multiple levels, as described above.    EKG: 12/18/2022: Sinus rhythm with premature atrial complexes.  PR interval 144 ms.   CV: LE venous dopplers 12/25/2022: Summary:  BILATERAL:  - No evidence of  deep vein thrombosis seen in the lower extremities,  bilaterally.   RIGHT:  - A cystic structure is found in the popliteal fossa measuring  approximately 4.6 x 1.3 x 4.3 cm.   - 8.8 x 2.4 x 3.8 cm heterogenous avascular complex mass appearing to come from hip joint. Second complex avascular appearing structure anterior to proximal femur measuring 3.8 x 4.8 x 2.9 cm. With history of multiple hip  replacements, these could represent seromas. If clinically indicated, alternative imaging is recommended.    LEFT:  - A cystic structure is found in the popliteal fossa measuring  approximately 3.9 x 1.3 x 1.7 cm.       ABIs 12/27/02: 08/2023: Summary:  - Right: Resting right ankle-brachial index is within normal range. The  right toe-brachial index is abnormal.  - Left: Resting left ankle-brachial index is within normal range. The left  toe-brachial index is abnormal.  - Bilateral ABIs and TBIs appear essentially unchanged compared to prior  study on 10/03/2018.    Echo 02/26/2022: IMPRESSIONS   1. Left ventricular ejection fraction, by estimation, is 60 to 65%. The  left ventricle has normal function. The left ventricle has no regional  wall motion abnormalities. Left ventricular diastolic parameters were  normal. The average left ventricular  global longitudinal strain is -22.4 %. The global longitudinal strain is  normal.   2. Right ventricular systolic function is normal. The right ventricular  size is normal. There is mildly elevated pulmonary artery systolic  pressure. The estimated right ventricular systolic pressure is 41.7 mmHg.   3. Left atrial size was moderately dilated.   4. The mitral valve is abnormal. Mild to moderate mitral valve  regurgitation.   5. Tricuspid valve regurgitation is moderate to severe.   6. The aortic valve is tricuspid. Aortic valve regurgitation is not  visualized. Aortic valve sclerosis is present, with no evidence of aortic  valve stenosis.   7.  The inferior vena cava is normal in size with greater than 50%  respiratory variability, suggesting right atrial pressure of 3 mmHg.  - Comparison(s): 06/30/19: LVEF >65%. Mild MR.    Nuclear stress test 07/21/2019: The left ventricular ejection fraction is mildly decreased (45-54%). Nuclear stress EF: 52%. There was no ST segment deviation noted during stress. Findings consistent with ischemia. This is a low risk study.   Mildly abnormal stress nuclear study with mild inferobasal ischemia; EF 52 with normal wall motion.   Cardiac event monitor 06/21/2010 - 07/05/2010:  Short runs of narrow complex regular tachycardia that terminates abruptly, likely supraventricular in origin (AVNRT?) - Rate > 150.   Past Medical History:  Diagnosis Date   Allergies    Allergy    Anxiety    Arthritis    Atrophic vaginitis    Cancer (HCC)    throat cancer   Cataract    hx/o surgery, both eyes; Dr. Nile Riggs   Complication of anesthesia    Diverticulosis    per 2010 colonoscopy   Dyslipidemia    Full dentures    GERD (gastroesophageal reflux disease)    hx/o, resolved   Glaucoma 2016   H/O echocardiogram 06/22/2010   normal LVEF, moderate mitral and tricuspid regurg 07/2016 Dr. Rennis Golden;  mild aortic valve stenosis, left ventricular  normal, EF 55%; Dr. Allyson Sabal  06/2010   History of cardiovascular stress test 06/22/2010   normal bruce myocadial perfusion study, 72% EF; Dr. Allyson Sabal   History of mammogram    benign bilat calcifications, heterogeneously dense breasts, stable mammograms 2009-2014   History of pneumonia    walking pneumonia "years ago"   History of uterine cancer 12/2002   s/p TAH and BSO, pelvic and periarotic lymphadenectomy 12/2002, no adjuvant therapy; stage 1b carcinosarcoma of uterus, completed 5 years of f/u as of 2008; Dr. De Blanch gyencology oncology [place]; Dr. 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 Alanda Amass   Osteoporosis    PONV (postoperative nausea and vomiting)    SVT (supraventricular tachycardia)    asymptomatic 2014, hx/o SVT, Dr. Alanda Amass   Vitamin D deficiency     Past Surgical History:  Procedure Laterality Date   ABDOMINAL HYSTERECTOMY Bilateral    h/o uterine cancer; oophrectomy   CATARACT EXTRACTION     bilat   COLONOSCOPY  08/2016   08/2016  Dr. Elnoria Howard advised no repeat colonoscopy;  scattered diverticla, medium hemorrhoids 2010; Dr. Elnoria Howard   JOINT REPLACEMENT     NM MYOCAR PERF WALL MOTION     THROAT SURGERY     some tissue removed from throat at [facility]   TONSILLECTOMY Right 12/28/2019   Procedure: TONSILLECTOMY;  Surgeon: Christia Reading, MD;  Location: [facility];  Service: ENT;  Laterality: Right;   TOTAL HIP ARTHROPLASTY Bilateral 2008, 2010    twice left (complication on the left, once on the right; Dr. Dorene Grebe   TRANSTHORACIC ECHOCARDIOGRAM  02/2013   ordered for SVT; EF 55-65%; calcified MV annulus, mod MR; RSVP increased; RA mildly dilated; mod TR; PA peak pressure    MEDICATIONS:  acetaminophen (TYLENOL) 500 MG tablet   aspirin 81 MG EC tablet   atenolol (TENORMIN) 50 MG tablet   brimonidine (ALPHAGAN P) 0.1 % SOLN   Cholecalciferol (VITAMIN D3 PO)   latanoprost (XALATAN) 0.005 % ophthalmic solution   levocetirizine (XYZAL) 5 MG tablet   lisinopril (ZESTRIL) 40 MG tablet   OVER THE COUNTER MEDICATION   simvastatin (ZOCOR) 40 MG tablet   No current facility-administered medications for this encounter.    Shonna Chock, PA-C Surgical Short Stay/Anesthesiology [facility] Phone [phone] [facility] Phone [phone] 05/28/2023 3:41 PM

## 2023-05-28 NOTE — Telephone Encounter (Signed)
Pt has been scheduled for tele pre op appt 05/30/23 @ 10:40, add on due to procedure date and we just received the request today.   Med rec and consent are done.    Patient Consent for Virtual Visit        [name] has provided verbal consent on 05/28/2023 for a virtual visit (video or telephone).   CONSENT FOR VIRTUAL VISIT FOR:  [name]  By participating in this virtual visit I agree to the following:  I hereby voluntarily request, consent and authorize [facility] and its employed or contracted physicians, physician assistants, nurse practitioners or other licensed health care professionals (the Practitioner), to provide me with telemedicine health care services (the "Services") as deemed necessary by the treating Practitioner. I acknowledge and consent to receive the Services by the Practitioner via telemedicine. I understand that the telemedicine visit will involve communicating with the Practitioner through live audiovisual communication technology and the disclosure of certain medical information by electronic transmission. I acknowledge that I have been given the opportunity to request an in-person assessment or other available alternative prior to the telemedicine visit and am voluntarily participating in the telemedicine visit.  I understand that I have the right to withhold or withdraw my consent to the use of telemedicine in the course of my care at any time, without affecting my right to future care or treatment, and that the Practitioner or I may terminate the telemedicine visit at any time. I understand that I have the right to inspect all information obtained and/or recorded in the course of the telemedicine visit and may receive copies of available information for a reasonable fee.  I understand that some of the potential risks of receiving the Services via telemedicine include:  Delay or interruption in medical evaluation due to technological  equipment failure or disruption; Information transmitted may not be sufficient (e.g. poor resolution of images) to allow for appropriate medical decision making by the Practitioner; and/or  In rare instances, security protocols could fail, causing a breach of personal health information.  Furthermore, I acknowledge that it is my responsibility to provide information about my medical history, conditions and care that is complete and accurate to the best of my ability. I acknowledge that Practitioner's advice, recommendations, and/or decision may be based on factors not within their control, such as incomplete or inaccurate data provided by me or distortions of diagnostic images or specimens that may result from electronic transmissions. I understand that the practice of medicine is not an exact science and that Practitioner makes no warranties or guarantees regarding treatment outcomes. I acknowledge that a copy of this consent can be made available to me via my patient portal [facility] MyChart), or I can request a printed copy by calling the office of [facility].    I understand that my insurance will be billed for this visit.   I have read or had this consent read to me. I understand the contents of this consent, which adequately explains the benefits and risks of the Services being provided via telemedicine.  I have been provided ample opportunity to ask questions regarding this consent and the Services and have had my questions answered to my satisfaction. I give my informed consent for the services to be provided through the use of telemedicine in my medical care

## 2023-05-28 NOTE — Telephone Encounter (Signed)
   Name: SUTTON PLAKE  DOB: 07-31-1933  MRN: [id number]  Primary Cardiologist: Chrystie Nose, MD  Chart reviewed as part of pre-operative protocol coverage. Because of Verina Galeno Hally's past medical history and time since last visit, she will require a follow-up telephone visit in order to better assess preoperative cardiovascular risk.  Pre-op covering staff: - Please schedule appointment and call patient to inform them. If patient already had an upcoming appointment within acceptable timeframe, please add "pre-op clearance" to the appointment notes so provider is aware. - Please contact requesting surgeon's office via preferred method (i.e, phone, fax) to inform them of need for appointment prior to surgery.  We do not manage her ASA. Would recommend getting guidance for the ASA hold from managing provider (PCP or otherwise).  Sharlene Dory, PA-C  05/28/2023, 4:24 PM

## 2023-05-28 NOTE — Telephone Encounter (Signed)
   Pre-operative Risk Assessment    Patient Name: [name]  DOB: 06/06/1933 MRN: [id number]      Request for Surgical Clearance    Procedure:   RIGHT HIP RIVISION   Date of Surgery:  Clearance 06/03/23                                 Surgeon:  DR. Etter Sjogren XU  Surgeon's Group or Practice Name:  [facility] AT [facility]  Phone number:  [phone] Fax number:  [phone]   Type of Clearance Requested:   - Medical  - Pharmacy:  Hold Aspirin NEEDS INSTRUCTIONS WHEN TO HOLD   Type of Anesthesia:  Spinal   Additional requests/questions:    SignedMichaelle Copas   05/28/2023, 3:53 PM

## 2023-05-28 NOTE — Telephone Encounter (Signed)
Pt has been scheduled for tele pre op appt 05/30/23 @ 10:40, add on due to procedure date and we just received the request today.    Med rec and consent are done.

## 2023-05-28 NOTE — Telephone Encounter (Signed)
FYI   Patient is pending right total hip revision on 06/03/2023.  She was advised to ask Dr. Roda Shutters when to stop aspirin. Per Dr. Roda Shutters, she does not need to stop 81mg  ASA prior to procedure. Confirmed with patient that this is the dose that she is taking. Patient expressed understanding.

## 2023-05-29 ENCOUNTER — Other Ambulatory Visit: Payer: Self-pay | Admitting: Physician Assistant

## 2023-05-29 MED ORDER — HYDROCODONE-ACETAMINOPHEN 5-325 MG PO TABS: 1.0000 | ORAL_TABLET | Freq: Three times a day (TID) | ORAL | 0 refills | Status: DC | PRN

## 2023-05-29 MED ORDER — METHOCARBAMOL 500 MG PO TABS: 500.0000 mg | ORAL_TABLET | Freq: Two times a day (BID) | ORAL | 2 refills | Status: DC | PRN

## 2023-05-29 MED ORDER — RIVAROXABAN 10 MG PO TABS
10.0000 mg | ORAL_TABLET | Freq: Every day | ORAL | 0 refills | Status: DC
Start: 2023-05-29 — End: 2023-10-03

## 2023-05-29 MED ORDER — ONDANSETRON HCL 4 MG PO TABS: 4.0000 mg | ORAL_TABLET | Freq: Three times a day (TID) | ORAL | 0 refills | Status: AC | PRN

## 2023-05-29 MED ORDER — DOCUSATE SODIUM 100 MG PO CAPS: 100.0000 mg | ORAL_CAPSULE | Freq: Every day | ORAL | 2 refills | Status: AC | PRN

## 2023-05-29 NOTE — Progress Notes (Signed)
Virtual Visit via Telephone Note   Because of [name]'s co-morbid illnesses, she is at least at moderate risk for complications without adequate follow up.  This format is felt to be most appropriate for this patient at this time.  The patient did not have access to video technology/had technical difficulties with video requiring transitioning to audio format only (telephone).  All issues noted in this document were discussed and addressed.  No physical exam could be performed with this format.  Please refer to the patient's chart for her consent to telehealth for [facility].  Evaluation Performed:  Preoperative cardiovascular risk assessment _____________   Date:  05/29/2023   Patient ID:  [name], DOB [birth year], MRN [id number] Patient Location:  Home Provider location:   Office  Primary Care Provider:  Ollen Bowl, MD Primary Cardiologist:  Chrystie Nose, MD  Chief Complaint / Patient Profile   89 y.o. 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  History of Present Illness    [name] is a 89 y.o. female who presents via audio/video conferencing for a telehealth visit today.  Pt was last seen in cardiology clinic on 01/28/23 by Bernadene Person, NP.  At that time [name] was doing well.  The patient is now pending procedure as outlined above. Since her last visit, she denies chest pain, shortness of breath, lower extremity edema, fatigue, palpitations, melena, hematuria, hemoptysis, diaphoresis, weakness, presyncope, syncope, orthopnea, and PND. She is very active at home and in her yard as well and has regular workouts at the [facility] and walking around her home for exercise. She does not have any  concerning cardiac symptoms.   Past Medical History    Past Medical History:  Diagnosis Date   Allergies    Allergy    Anxiety    Arthritis    Atrophic vaginitis    Cancer (HCC)    throat cancer   Cataract    hx/o surgery, both eyes; Dr. Nile Riggs   Complication of anesthesia    Diverticulosis    per 2010 colonoscopy   Dyslipidemia    Full dentures    GERD (gastroesophageal reflux disease)    hx/o, resolved   Glaucoma 2016   H/O echocardiogram 06/22/2010   normal LVEF, moderate mitral and tricuspid regurg 07/2016 Dr. Rennis Golden;  mild aortic valve stenosis, left ventricular normal, EF 55%; Dr. Allyson Sabal  06/2010   History of cardiovascular stress test 06/22/2010   normal bruce myocadial perfusion study, 72% EF; Dr. Allyson Sabal   History of mammogram    benign bilat calcifications, heterogeneously dense breasts, stable mammograms 2009-2014   History of pneumonia    walking pneumonia "years ago"   History of uterine cancer 12/2002   s/p TAH and BSO, pelvic and periarotic lymphadenectomy 12/2002, no adjuvant therapy; stage 1b carcinosarcoma of uterus, completed 5 years of f/u as of 2008; Dr. De Blanch gyencology oncology [place]; Dr. 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 Alanda Amass   Osteoporosis    PONV (postoperative nausea and vomiting)    SVT (supraventricular tachycardia)    asymptomatic 2014, hx/o SVT, Dr. Alanda Amass   Vitamin D deficiency  Past Surgical History:  Procedure Laterality Date   ABDOMINAL HYSTERECTOMY Bilateral    h/o uterine cancer; oophrectomy   CATARACT EXTRACTION     bilat   COLONOSCOPY  08/2016   08/2016  Dr. Elnoria Howard advised no repeat colonoscopy;  scattered diverticla, medium hemorrhoids 2010; Dr. Elnoria Howard   JOINT REPLACEMENT     NM MYOCAR PERF WALL MOTION     THROAT SURGERY     some tissue removed from throat at [facility]    TONSILLECTOMY Right 12/28/2019   Procedure: TONSILLECTOMY;  Surgeon: Christia Reading, MD;  Location: [facility];  Service: ENT;  Laterality: Right;   TOTAL HIP ARTHROPLASTY Bilateral 2008, 2010    twice left (complication on the left, once on the right; Dr. Dorene Grebe   TRANSTHORACIC ECHOCARDIOGRAM  02/2013   ordered for SVT; EF 55-65%; calcified MV annulus, mod MR; RSVP increased; RA mildly dilated; mod TR; PA peak pressure    Allergies  Allergies  Allergen Reactions   Other Nausea Only    "medicines with steroids in it make me nauseous"   Prednisone Nausea And Vomiting    Home Medications    Prior to Admission medications   Medication Sig Start Date End Date Taking? Authorizing Provider  docusate sodium (COLACE) 100 MG capsule Take 1 capsule (100 mg total) by mouth daily as needed. 05/29/23 05/28/24  Cristie Hem, PA-C  HYDROcodone-acetaminophen (NORCO) 5-325 MG tablet Take 1-2 tablets by mouth 3 (three) times daily as needed. To be taken after surgery 05/29/23   Cristie Hem, PA-C  methocarbamol (ROBAXIN) 500 MG tablet Take 1 tablet (500 mg total) by mouth 2 (two) times daily as needed. 05/29/23   Cristie Hem, PA-C  ondansetron (ZOFRAN) 4 MG tablet Take 1 tablet (4 mg total) by mouth every 8 (eight) hours as needed for nausea or vomiting. 05/29/23   Cristie Hem, PA-C  rivaroxaban (XARELTO) 10 MG TABS tablet Take 1 tablet (10 mg total) by mouth daily. To be taken after surgery to prevent blood clots 05/29/23   Cristie Hem, PA-C  acetaminophen (TYLENOL) 500 MG tablet Take 1,000 mg by mouth every 8 (eight) hours as needed for mild pain.    [provider]  aspirin 81 MG EC tablet Take 81 mg by mouth in the morning.    [provider]  atenolol (TENORMIN) 50 MG tablet TAKE 1 TABLET BY MOUTH EVERY DAY Patient taking differently: Take 50 mg by mouth at bedtime. 08/25/21   Adonis Huguenin, NP  brimonidine (ALPHAGAN P) 0.1 % SOLN Place 1  drop into both eyes in the morning and at bedtime. 07/06/20   [provider]  Cholecalciferol (VITAMIN D3 PO) Take 1,000 Units by mouth in the morning.    [provider]  latanoprost (XALATAN) 0.005 % ophthalmic solution Place 1 drop into both eyes at bedtime.    [provider]  levocetirizine (XYZAL) 5 MG tablet Take 5 mg by mouth every evening.    [provider]  lisinopril (ZESTRIL) 40 MG tablet Take 1 tablet (40 mg total) by mouth daily. 01/28/23   Joylene Grapes, NP  OVER THE COUNTER MEDICATION Take 1,000 mg by mouth in the morning. [place] [employer]    [provider]  simvastatin (ZOCOR) 40 MG tablet TAKE 1 TABLET BY MOUTH EVERYDAY AT BEDTIME 08/15/20   Lavada Mesi, MD    Physical Exam    Vital Signs:  [name]  does not have vital signs available for review today.  Given telephonic nature of communication, physical exam is limited. AAOx3. NAD. Normal affect.  Speech and respirations are unlabored.  Accessory Clinical Findings    None  Assessment & Plan    1.  Preoperative Cardiovascular Risk Assessment: According to the Revised Cardiac Risk Index (RCRI), her Perioperative Risk of Major Cardiac Event is (%): 0.4. Her Functional Capacity in METs is: 6.61 according to the Duke Activity Status Index (DASI). The patient is doing well from a cardiac perspective. Therefore, based on ACC/AHA guidelines, the patient would be at acceptable risk for the planned procedure without further cardiovascular testing.   The patient was advised that if she develops new symptoms prior to surgery to contact our office to arrange for a follow-up visit, and she verbalized understanding.  There is no cardiac indication for aspirin.   A copy of this note will be routed to requesting surgeon.  Time:   Today, I have spent 10 minutes with the patient with telehealth technology discussing medical history, symptoms, and management plan.      Levi Aland, NP-C  05/30/2023, 10:41 AM [address] Office [phone] Fax [phone]

## 2023-05-30 ENCOUNTER — Encounter: Payer: Self-pay | Admitting: Nurse Practitioner

## 2023-05-30 ENCOUNTER — Ambulatory Visit: Payer: Medicare PPO | Attending: Cardiology | Admitting: Nurse Practitioner

## 2023-05-30 DIAGNOSIS — Z01810 Encounter for preprocedural cardiovascular examination: Secondary | ICD-10-CM

## 2023-05-30 NOTE — Anesthesia Preprocedure Evaluation (Addendum)
Anesthesia Evaluation  Patient identified by MRN, date of birth, ID band Patient awake    Reviewed: Allergy & Precautions, NPO status , Patient's Chart, lab work & pertinent test results  History of Anesthesia Complications (+) PONV and history of anesthetic complications  Airway Mallampati: III   Neck ROM: Full    Dental no notable dental hx. (+) Dental Advisory Given   Pulmonary neg pulmonary ROS   Pulmonary exam normal        Cardiovascular hypertension, Pt. on home beta blockers and Pt. on medications Normal cardiovascular exam+ Valvular Problems/Murmurs MR   Echo 02/26/2022: IMPRESSIONS   1. Left ventricular ejection fraction, by estimation, is 60 to 65%. The  left ventricle has normal function. The left ventricle has no regional  wall motion abnormalities. Left ventricular diastolic parameters were  normal. The average left ventricular  global longitudinal strain is -22.4 %. The global longitudinal strain is  normal.   2. Right ventricular systolic function is normal. The right ventricular  size is normal. There is mildly elevated pulmonary artery systolic  pressure. The estimated right ventricular systolic pressure is 41.7 mmHg.   3. Left atrial size was moderately dilated.   4. The mitral valve is abnormal. Mild to moderate mitral valve  regurgitation.   5. Tricuspid valve regurgitation is moderate to severe.   6. The aortic valve is tricuspid. Aortic valve regurgitation is not  visualized. Aortic valve sclerosis is present, with no evidence of aortic  valve stenosis.   7. The inferior vena cava is normal in size with greater than 50%  respiratory variability, suggesting right atrial pressure of 3 mmHg.  - Comparison(s): 06/30/19: LVEF >65%. Mild MR.      Nuclear stress test 07/21/2019:  The left ventricular ejection fraction is mildly decreased (45-54%).  Nuclear stress EF: 52%.  There was no ST segment deviation  noted during stress.  Findings consistent with ischemia.  This is a low risk study.   Mildly abnormal stress nuclear study with mild inferobasal ischemia; EF 52 with normal wall motion.      Neuro/Psych  PSYCHIATRIC DISORDERS Anxiety     negative neurological ROS     GI/Hepatic negative GI ROS, Neg liver ROS,,,  Endo/Other  negative endocrine ROS    Renal/GU negative Renal ROS     Musculoskeletal negative musculoskeletal ROS (+)    Abdominal   Peds  Hematology negative hematology ROS (+)   Anesthesia Other Findings   Reproductive/Obstetrics                             Anesthesia Physical Anesthesia Plan  ASA: 3  Anesthesia Plan: Spinal and MAC   Post-op Pain Management: Tylenol PO (pre-op)* and Toradol IV (intra-op)*   Induction:   PONV Risk Score and Plan: 3 and Ondansetron, Dexamethasone and Propofol infusion  Airway Management Planned: Simple Face Mask and Natural Airway  Additional Equipment:   Intra-op Plan:   Post-operative Plan:   Informed Consent: I have reviewed the patients History and Physical, chart, labs and discussed the procedure including the risks, benefits and alternatives for the proposed anesthesia with the patient or authorized representative who has indicated his/her understanding and acceptance.     Dental advisory given  Plan Discussed with: CRNA and Anesthesiologist  Anesthesia Plan Comments: (PAT note written by Shonna Chock, PA-C.  )       Anesthesia Quick Evaluation

## 2023-05-31 MED ORDER — TRANEXAMIC ACID 1000 MG/10ML IV SOLN
2000.0000 mg | INTRAVENOUS | Status: DC
  Filled 2023-05-31: qty 20

## 2023-05-31 NOTE — Progress Notes (Signed)
Left messages to both cell and home phone to arrive on Mon at 1025. To stop clear liquids at [phone]

## 2023-06-02 DIAGNOSIS — Z96649 Presence of unspecified artificial hip joint: Secondary | ICD-10-CM

## 2023-06-02 DIAGNOSIS — T84010A Broken internal right hip prosthesis, initial encounter: Secondary | ICD-10-CM | POA: Clinically undetermined

## 2023-06-02 DIAGNOSIS — T84018A Broken internal joint prosthesis, other site, initial encounter: Secondary | ICD-10-CM

## 2023-06-03 ENCOUNTER — Inpatient Hospital Stay (HOSPITAL_COMMUNITY): Payer: Medicare PPO | Admitting: Registered Nurse

## 2023-06-03 ENCOUNTER — Inpatient Hospital Stay (HOSPITAL_COMMUNITY)
Admission: RE | Admit: 2023-06-03 | Discharge: 2023-06-04 | DRG: 467 | Disposition: A | Discharge status: Home Health | Payer: Medicare PPO | Attending: Orthopaedic Surgery | Admitting: Orthopaedic Surgery

## 2023-06-03 ENCOUNTER — Inpatient Hospital Stay (HOSPITAL_COMMUNITY): Payer: Medicare PPO

## 2023-06-03 ENCOUNTER — Encounter (HOSPITAL_COMMUNITY)
Admission: RE | Disposition: A | Discharge status: Home Health | Payer: Self-pay | Source: Home / Self Care | Attending: Orthopaedic Surgery

## 2023-06-03 ENCOUNTER — Other Ambulatory Visit: Payer: Self-pay

## 2023-06-03 ENCOUNTER — Encounter (HOSPITAL_COMMUNITY): Payer: Self-pay | Admitting: Orthopaedic Surgery

## 2023-06-03 ENCOUNTER — Inpatient Hospital Stay (HOSPITAL_COMMUNITY): Payer: Medicare PPO | Admitting: Vascular Surgery

## 2023-06-03 DIAGNOSIS — F419 Anxiety disorder, unspecified: Secondary | ICD-10-CM | POA: Diagnosis present

## 2023-06-03 DIAGNOSIS — E739 Lactose intolerance, unspecified: Secondary | ICD-10-CM | POA: Diagnosis not present

## 2023-06-03 DIAGNOSIS — Z79899 Other long term (current) drug therapy: Secondary | ICD-10-CM

## 2023-06-03 DIAGNOSIS — T84090A Other mechanical complication of internal right hip prosthesis, initial encounter: Secondary | ICD-10-CM | POA: Diagnosis not present

## 2023-06-03 DIAGNOSIS — T84010A Broken internal right hip prosthesis, initial encounter: Secondary | ICD-10-CM

## 2023-06-03 DIAGNOSIS — Z8589 Personal history of malignant neoplasm of other organs and systems: Secondary | ICD-10-CM

## 2023-06-03 DIAGNOSIS — M810 Age-related osteoporosis without current pathological fracture: Secondary | ICD-10-CM | POA: Diagnosis present

## 2023-06-03 DIAGNOSIS — Z8542 Personal history of malignant neoplasm of other parts of uterus: Secondary | ICD-10-CM | POA: Diagnosis not present

## 2023-06-03 DIAGNOSIS — Z90710 Acquired absence of both cervix and uterus: Secondary | ICD-10-CM | POA: Diagnosis not present

## 2023-06-03 DIAGNOSIS — T84018A Broken internal joint prosthesis, other site, initial encounter: Secondary | ICD-10-CM

## 2023-06-03 DIAGNOSIS — M25451 Effusion, right hip: Secondary | ICD-10-CM | POA: Diagnosis not present

## 2023-06-03 DIAGNOSIS — Z96641 Presence of right artificial hip joint: Secondary | ICD-10-CM | POA: Diagnosis present

## 2023-06-03 DIAGNOSIS — Z96649 Presence of unspecified artificial hip joint: Secondary | ICD-10-CM

## 2023-06-03 DIAGNOSIS — Z96642 Presence of left artificial hip joint: Secondary | ICD-10-CM | POA: Diagnosis present

## 2023-06-03 DIAGNOSIS — Z471 Aftercare following joint replacement surgery: Secondary | ICD-10-CM | POA: Diagnosis not present

## 2023-06-03 DIAGNOSIS — H409 Unspecified glaucoma: Secondary | ICD-10-CM | POA: Diagnosis not present

## 2023-06-03 DIAGNOSIS — I340 Nonrheumatic mitral (valve) insufficiency: Secondary | ICD-10-CM | POA: Diagnosis not present

## 2023-06-03 DIAGNOSIS — E785 Hyperlipidemia, unspecified: Secondary | ICD-10-CM | POA: Diagnosis not present

## 2023-06-03 DIAGNOSIS — I10 Essential (primary) hypertension: Secondary | ICD-10-CM | POA: Diagnosis not present

## 2023-06-03 DIAGNOSIS — Z7901 Long term (current) use of anticoagulants: Secondary | ICD-10-CM | POA: Diagnosis not present

## 2023-06-03 DIAGNOSIS — D62 Acute posthemorrhagic anemia: Secondary | ICD-10-CM | POA: Diagnosis not present

## 2023-06-03 DIAGNOSIS — Z9079 Acquired absence of other genital organ(s): Secondary | ICD-10-CM

## 2023-06-03 DIAGNOSIS — Z7982 Long term (current) use of aspirin: Secondary | ICD-10-CM

## 2023-06-03 DIAGNOSIS — Z90722 Acquired absence of ovaries, bilateral: Secondary | ICD-10-CM | POA: Diagnosis not present

## 2023-06-03 DIAGNOSIS — Y792 Prosthetic and other implants, materials and accessory orthopedic devices associated with adverse incidents: Secondary | ICD-10-CM | POA: Diagnosis present

## 2023-06-03 DIAGNOSIS — M25551 Pain in right hip: Principal | ICD-10-CM

## 2023-06-03 HISTORY — PX: TOTAL HIP REVISION: SHX763

## 2023-06-03 SURGERY — TOTAL HIP REVISION
Anesthesia: Monitor Anesthesia Care | Site: Hip | Laterality: Right

## 2023-06-03 MED ORDER — SODIUM CHLORIDE 0.9 % IV SOLN: INTRAVENOUS | Status: DC

## 2023-06-03 MED ORDER — SURGIRINSE WOUND IRRIGATION SYSTEM - OPTIME
TOPICAL | Status: DC | PRN
  Administered 2023-06-03: 900 mL

## 2023-06-03 MED ORDER — OXYCODONE HCL 5 MG PO TABS: 5.0000 mg | ORAL_TABLET | ORAL | Status: DC | PRN

## 2023-06-03 MED ORDER — LACTATED RINGERS IV SOLN: INTRAVENOUS | Status: DC

## 2023-06-03 MED ORDER — BUPIVACAINE-MELOXICAM ER 400-12 MG/14ML IJ SOLN
INTRAMUSCULAR | Status: AC
  Filled 2023-06-03: qty 1

## 2023-06-03 MED ORDER — SORBITOL 70 % SOLN: 30.0000 mL | Freq: Every day | Status: DC | PRN

## 2023-06-03 MED ORDER — CHLORHEXIDINE GLUCONATE 0.12 % MT SOLN: 15.0000 mL | Freq: Once | OROMUCOSAL | Status: AC

## 2023-06-03 MED ORDER — FENTANYL CITRATE (PF) 250 MCG/5ML IJ SOLN
INTRAMUSCULAR | Status: DC | PRN
  Administered 2023-06-03 (×3): 25 ug via INTRAVENOUS

## 2023-06-03 MED ORDER — ONDANSETRON HCL 4 MG/2ML IJ SOLN
INTRAMUSCULAR | Status: DC | PRN
  Administered 2023-06-03: 4 mg via INTRAVENOUS

## 2023-06-03 MED ORDER — POVIDONE-IODINE 10 % EX SWAB
2.0000 | Freq: Once | CUTANEOUS | Status: AC
  Administered 2023-06-03: 2 via TOPICAL

## 2023-06-03 MED ORDER — ORAL CARE MOUTH RINSE: 15.0000 mL | Freq: Once | OROMUCOSAL | Status: AC

## 2023-06-03 MED ORDER — TRANEXAMIC ACID-NACL 1000-0.7 MG/100ML-% IV SOLN
INTRAVENOUS | Status: AC
  Filled 2023-06-03: qty 100

## 2023-06-03 MED ORDER — METHOCARBAMOL 500 MG PO TABS
500.0000 mg | ORAL_TABLET | Freq: Four times a day (QID) | ORAL | Status: DC | PRN
  Administered 2023-06-03: 500 mg via ORAL
  Filled 2023-06-03: qty 1

## 2023-06-03 MED ORDER — MENTHOL 3 MG MT LOZG: 1.0000 | LOZENGE | OROMUCOSAL | Status: DC | PRN

## 2023-06-03 MED ORDER — ONDANSETRON HCL 4 MG PO TABS: 4.0000 mg | ORAL_TABLET | Freq: Four times a day (QID) | ORAL | Status: DC | PRN

## 2023-06-03 MED ORDER — TRANEXAMIC ACID-NACL 1000-0.7 MG/100ML-% IV SOLN
1000.0000 mg | Freq: Once | INTRAVENOUS | Status: AC
  Administered 2023-06-03: 1000 mg via INTRAVENOUS
  Filled 2023-06-03: qty 100

## 2023-06-03 MED ORDER — ALUM & MAG HYDROXIDE-SIMETH 200-200-20 MG/5ML PO SUSP: 30.0000 mL | ORAL | Status: DC | PRN

## 2023-06-03 MED ORDER — METOCLOPRAMIDE HCL 5 MG PO TABS: 5.0000 mg | ORAL_TABLET | Freq: Three times a day (TID) | ORAL | Status: DC | PRN

## 2023-06-03 MED ORDER — METHOCARBAMOL 1000 MG/10ML IJ SOLN: 500.0000 mg | Freq: Four times a day (QID) | INTRAVENOUS | Status: DC | PRN

## 2023-06-03 MED ORDER — MAGNESIUM CITRATE PO SOLN: 1.0000 | Freq: Once | ORAL | Status: DC | PRN

## 2023-06-03 MED ORDER — SODIUM CHLORIDE 0.9 % IR SOLN
Status: DC | PRN
  Administered 2023-06-03: 1000 mL

## 2023-06-03 MED ORDER — VANCOMYCIN HCL 1000 MG IV SOLR
INTRAVENOUS | Status: DC | PRN
  Administered 2023-06-03: 1000 mg via TOPICAL

## 2023-06-03 MED ORDER — CEFAZOLIN SODIUM-DEXTROSE 2-4 GM/100ML-% IV SOLN
INTRAVENOUS | Status: AC
  Filled 2023-06-03: qty 100

## 2023-06-03 MED ORDER — PANTOPRAZOLE SODIUM 40 MG PO TBEC
40.0000 mg | DELAYED_RELEASE_TABLET | Freq: Every day | ORAL | Status: DC
  Administered 2023-06-03: 40 mg via ORAL
  Filled 2023-06-03 (×2): qty 1

## 2023-06-03 MED ORDER — PROPOFOL 500 MG/50ML IV EMUL
INTRAVENOUS | Status: DC | PRN
  Administered 2023-06-03: 75 ug/kg/min via INTRAVENOUS

## 2023-06-03 MED ORDER — ASPIRIN 81 MG PO CHEW
81.0000 mg | CHEWABLE_TABLET | Freq: Once | ORAL | Status: AC
  Administered 2023-06-03: 81 mg via ORAL
  Filled 2023-06-03: qty 1

## 2023-06-03 MED ORDER — FERROUS SULFATE 325 (65 FE) MG PO TABS
325.0000 mg | ORAL_TABLET | Freq: Three times a day (TID) | ORAL | Status: DC
  Administered 2023-06-03: 325 mg via ORAL
  Filled 2023-06-03 (×3): qty 1

## 2023-06-03 MED ORDER — ONDANSETRON HCL 4 MG/2ML IJ SOLN
4.0000 mg | Freq: Four times a day (QID) | INTRAMUSCULAR | Status: DC | PRN
  Administered 2023-06-03: 4 mg via INTRAVENOUS
  Filled 2023-06-03: qty 2

## 2023-06-03 MED ORDER — BUPIVACAINE-MELOXICAM ER 400-12 MG/14ML IJ SOLN
INTRAMUSCULAR | Status: DC | PRN
  Administered 2023-06-03: 400 mg

## 2023-06-03 MED ORDER — POLYETHYLENE GLYCOL 3350 17 G PO PACK
17.0000 g | PACK | Freq: Every day | ORAL | Status: DC
  Administered 2023-06-04: 17 g via ORAL
  Filled 2023-06-03 (×3): qty 1

## 2023-06-03 MED ORDER — ONDANSETRON HCL 4 MG/2ML IJ SOLN
INTRAMUSCULAR | Status: AC
  Filled 2023-06-03: qty 6

## 2023-06-03 MED ORDER — OXYCODONE HCL 5 MG PO TABS: 10.0000 mg | ORAL_TABLET | ORAL | Status: DC | PRN

## 2023-06-03 MED ORDER — RIVAROXABAN 10 MG PO TABS
10.0000 mg | ORAL_TABLET | Freq: Every day | ORAL | Status: DC
  Administered 2023-06-04: 10 mg via ORAL
  Filled 2023-06-03: qty 1

## 2023-06-03 MED ORDER — ACETAMINOPHEN 500 MG PO TABS
1000.0000 mg | ORAL_TABLET | Freq: Four times a day (QID) | ORAL | Status: DC
  Administered 2023-06-03 – 2023-06-04 (×3): 1000 mg via ORAL
  Filled 2023-06-03 (×4): qty 2

## 2023-06-03 MED ORDER — ACETAMINOPHEN 325 MG PO TABS: 325.0000 mg | ORAL_TABLET | Freq: Four times a day (QID) | ORAL | Status: DC | PRN

## 2023-06-03 MED ORDER — ALBUMIN HUMAN 5 % IV SOLN: INTRAVENOUS | Status: DC | PRN

## 2023-06-03 MED ORDER — CEFAZOLIN SODIUM-DEXTROSE 2-4 GM/100ML-% IV SOLN
2.0000 g | INTRAVENOUS | Status: AC
  Administered 2023-06-03: 2 g via INTRAVENOUS

## 2023-06-03 MED ORDER — TRANEXAMIC ACID-NACL 1000-0.7 MG/100ML-% IV SOLN
1000.0000 mg | INTRAVENOUS | Status: AC
  Administered 2023-06-03: 1000 mg via INTRAVENOUS

## 2023-06-03 MED ORDER — CEFAZOLIN SODIUM-DEXTROSE 2-4 GM/100ML-% IV SOLN
2.0000 g | Freq: Four times a day (QID) | INTRAVENOUS | Status: AC
  Administered 2023-06-03 – 2023-06-04 (×2): 2 g via INTRAVENOUS
  Filled 2023-06-03 (×2): qty 100

## 2023-06-03 MED ORDER — BUPIVACAINE IN DEXTROSE 0.75-8.25 % IT SOLN
INTRATHECAL | Status: DC | PRN
  Administered 2023-06-03: 1.8 mL via INTRATHECAL

## 2023-06-03 MED ORDER — PHENYLEPHRINE HCL-NACL 20-0.9 MG/250ML-% IV SOLN
INTRAVENOUS | Status: DC | PRN
  Administered 2023-06-03: 25 ug/min via INTRAVENOUS

## 2023-06-03 MED ORDER — 0.9 % SODIUM CHLORIDE (POUR BTL) OPTIME
TOPICAL | Status: DC | PRN
  Administered 2023-06-03: 3000 mL
  Administered 2023-06-03: 1000 mL

## 2023-06-03 MED ORDER — DOCUSATE SODIUM 100 MG PO CAPS
100.0000 mg | ORAL_CAPSULE | Freq: Two times a day (BID) | ORAL | Status: DC
  Administered 2023-06-03 – 2023-06-04 (×2): 100 mg via ORAL
  Filled 2023-06-03 (×2): qty 1

## 2023-06-03 MED ORDER — HYDROMORPHONE HCL 1 MG/ML IJ SOLN: 0.5000 mg | INTRAMUSCULAR | Status: DC | PRN

## 2023-06-03 MED ORDER — ACETAMINOPHEN 10 MG/ML IV SOLN
INTRAVENOUS | Status: AC
  Filled 2023-06-03: qty 100

## 2023-06-03 MED ORDER — DEXAMETHASONE SODIUM PHOSPHATE 10 MG/ML IJ SOLN
10.0000 mg | Freq: Once | INTRAMUSCULAR | Status: DC
  Filled 2023-06-03: qty 1

## 2023-06-03 MED ORDER — DIPHENHYDRAMINE HCL 12.5 MG/5ML PO ELIX: 25.0000 mg | ORAL_SOLUTION | ORAL | Status: DC | PRN

## 2023-06-03 MED ORDER — LISINOPRIL 20 MG PO TABS
40.0000 mg | ORAL_TABLET | Freq: Every day | ORAL | Status: DC
  Administered 2023-06-04: 40 mg via ORAL
  Filled 2023-06-03: qty 2

## 2023-06-03 MED ORDER — METOCLOPRAMIDE HCL 5 MG/ML IJ SOLN: 5.0000 mg | Freq: Three times a day (TID) | INTRAMUSCULAR | Status: DC | PRN

## 2023-06-03 MED ORDER — DEXAMETHASONE SODIUM PHOSPHATE 10 MG/ML IJ SOLN
INTRAMUSCULAR | Status: AC
  Filled 2023-06-03: qty 2

## 2023-06-03 MED ORDER — ACETAMINOPHEN 10 MG/ML IV SOLN
INTRAVENOUS | Status: DC | PRN
  Administered 2023-06-03: 1000 mg via INTRAVENOUS

## 2023-06-03 MED ORDER — FENTANYL CITRATE (PF) 250 MCG/5ML IJ SOLN
INTRAMUSCULAR | Status: AC
  Filled 2023-06-03: qty 5

## 2023-06-03 MED ORDER — TRANEXAMIC ACID 1000 MG/10ML IV SOLN
INTRAVENOUS | Status: DC | PRN
  Administered 2023-06-03: 2000 mg via TOPICAL

## 2023-06-03 MED ORDER — ATENOLOL 25 MG PO TABS
50.0000 mg | ORAL_TABLET | Freq: Every day | ORAL | Status: DC
  Administered 2023-06-03: 50 mg via ORAL
  Filled 2023-06-03: qty 2

## 2023-06-03 MED ORDER — CHLORHEXIDINE GLUCONATE 0.12 % MT SOLN
OROMUCOSAL | Status: AC
  Administered 2023-06-03: 15 mL via OROMUCOSAL
  Filled 2023-06-03: qty 15

## 2023-06-03 MED ORDER — PHENOL 1.4 % MT LIQD: 1.0000 | OROMUCOSAL | Status: DC | PRN

## 2023-06-03 MED ORDER — OXYCODONE HCL ER 10 MG PO T12A
10.0000 mg | EXTENDED_RELEASE_TABLET | Freq: Two times a day (BID) | ORAL | Status: DC
  Filled 2023-06-03 (×2): qty 1

## 2023-06-03 MED ORDER — VANCOMYCIN HCL 1000 MG IV SOLR
INTRAVENOUS | Status: AC
  Filled 2023-06-03: qty 20

## 2023-06-03 SURGICAL SUPPLY — 74 items
ADH SKN CLS APL DERMABOND .7 (GAUZE/BANDAGES/DRESSINGS) ×1
BAG COUNTER SPONGE SURGICOUNT (BAG) ×1 IMPLANT
BAG SPNG CNTER NS LX DISP (BAG) ×1
BLADE CLIPPER SURG (BLADE) ×1 IMPLANT
BRUSH FEMORAL CANAL (MISCELLANEOUS) IMPLANT
COVER SURGICAL LIGHT HANDLE (MISCELLANEOUS) ×1 IMPLANT
CUP ACETAB PIN MULTI 54MM (Orthopedic Implant) IMPLANT
DERMABOND ADVANCED .7 DNX12 (GAUZE/BANDAGES/DRESSINGS) IMPLANT
DRAPE HALF SHEET 40X57 (DRAPES) ×2 IMPLANT
DRAPE HIP W/POCKET STRL (MISCELLANEOUS) ×1 IMPLANT
DRAPE INCISE IOBAN 66X45 STRL (DRAPES) ×1 IMPLANT
DRAPE ORTHO SPLIT 77X108 STRL (DRAPES) ×2
DRAPE SURG ORHT 6 SPLT 77X108 (DRAPES) ×2 IMPLANT
DRAPE U-SHAPE 47X51 STRL (DRAPES) ×1 IMPLANT
DRSG AQUACEL AG ADV 3.5X10 (GAUZE/BANDAGES/DRESSINGS) IMPLANT
DRSG AQUACEL AG ADV 3.5X14 (GAUZE/BANDAGES/DRESSINGS) IMPLANT
DURAPREP 26ML APPLICATOR (WOUND CARE) ×3 IMPLANT
ELECT BLADE 6.5 EXT (BLADE) IMPLANT
ELECT CAUTERY BLADE 6.4 (BLADE) IMPLANT
ELECT REM PT RETURN 9FT ADLT (ELECTROSURGICAL) ×1
ELECTRODE REM PT RTRN 9FT ADLT (ELECTROSURGICAL) ×1 IMPLANT
EVACUATOR 1/8 PVC DRAIN (DRAIN) IMPLANT
FILTER STRAW FLUID ASPIR (MISCELLANEOUS) ×1 IMPLANT
GLOVE BIOGEL PI IND STRL 7.0 (GLOVE) ×2 IMPLANT
GLOVE BIOGEL PI IND STRL 7.5 (GLOVE) ×3 IMPLANT
GLOVE ECLIPSE 6.5 STRL STRAW (GLOVE) ×2 IMPLANT
GLOVE SKINSENSE STRL SZ7.5 (GLOVE) ×1 IMPLANT
GLOVE SURG SYN 7.5 E (GLOVE) ×2 IMPLANT
GLOVE SURG SYN 7.5 PF PI (GLOVE) ×2 IMPLANT
GLOVE SURG UNDER POLY LF SZ7 (GLOVE) ×19 IMPLANT
GLOVE SURG UNDER POLY LF SZ7.5 (GLOVE) ×2 IMPLANT
GOWN TOGA ZIPPER T7+ PEEL AWAY (MISCELLANEOUS) ×2 IMPLANT
HANDPIECE INTERPULSE COAX TIP (DISPOSABLE)
HEAD CERAMIC BIOLOX DELTA 36 6 (Hips) IMPLANT
HOOD PEEL AWAY T7 (MISCELLANEOUS) ×1 IMPLANT
IMMOBILIZER KNEE 22 UNIV (SOFTGOODS) IMPLANT
KIT BASIN OR (CUSTOM PROCEDURE TRAY) ×1 IMPLANT
KIT TURNOVER KIT B (KITS) ×1 IMPLANT
LINER NEUTRAL 52X36X54 PLUS 4 (Liner) IMPLANT
MANIFOLD NEPTUNE II (INSTRUMENTS) ×1 IMPLANT
MARKER SKIN DUAL TIP RULER LAB (MISCELLANEOUS) IMPLANT
NDL SPNL 18GX3.5 QUINCKE PK (NEEDLE) ×1 IMPLANT
NEEDLE SPNL 18GX3.5 QUINCKE PK (NEEDLE) ×1 IMPLANT
NS IRRIG 1000ML POUR BTL (IV SOLUTION) ×1 IMPLANT
PACK TOTAL JOINT (CUSTOM PROCEDURE TRAY) ×1 IMPLANT
PAD ARMBOARD 7.5X6 YLW CONV (MISCELLANEOUS) ×2 IMPLANT
PASSER SUT SWANSON 36MM LOOP (INSTRUMENTS) ×1 IMPLANT
PIN CUP DEEP PROFILE ACE SZ 54 (Hips) IMPLANT
SCREW 6.5MMX30MM (Screw) IMPLANT
SCREW PINN CAN 6.5X20 (Screw) IMPLANT
SCREW PINN CAN BONE 6.5MMX15MM (Screw) IMPLANT
SET HNDPC FAN SPRY TIP SCT (DISPOSABLE) IMPLANT
SOLUTION IRRIG SURGIPHOR (IV SOLUTION) IMPLANT
SPONGE T-LAP 18X18 ~~LOC~~+RFID (SPONGE) IMPLANT
STAPLER VISISTAT 35W (STAPLE) IMPLANT
SUT ETHIBOND NAB CT1 #1 30IN (SUTURE) ×1 IMPLANT
SUT ETHILON 2 0 PSLX (SUTURE) IMPLANT
SUT MON AB 2-0 CT1 27 (SUTURE) IMPLANT
SUT MON AB 2-0 CT1 36 (SUTURE) IMPLANT
SUT PDS AB 0 CT 36 (SUTURE) ×1 IMPLANT
SUT PDS AB 1 CT 36 (SUTURE) ×1 IMPLANT
SUT VIC AB 1 CT1 27 (SUTURE) ×2
SUT VIC AB 1 CT1 27XBRD ANTBC (SUTURE) ×2 IMPLANT
SUT VIC AB 2-0 CT1 27 (SUTURE) ×2
SUT VIC AB 2-0 CT1 TAPERPNT 27 (SUTURE) ×2 IMPLANT
SYR 50ML LL SCALE MARK (SYRINGE) ×1 IMPLANT
SYR TB 1ML LUER SLIP (SYRINGE) ×1 IMPLANT
TOWEL GREEN STERILE (TOWEL DISPOSABLE) ×1 IMPLANT
TOWEL GREEN STERILE FF (TOWEL DISPOSABLE) ×1 IMPLANT
TOWER CARTRIDGE SMART MIX (DISPOSABLE) IMPLANT
TRAY FOL W/BAG SLVR 16FR STRL (SET/KITS/TRAYS/PACK) IMPLANT
TRAY FOLEY W/BAG SLVR 16FR LF (SET/KITS/TRAYS/PACK)
TUBE SUCT ARGYLE STRL (TUBING) ×1 IMPLANT
WATER STERILE IRR 1000ML POUR (IV SOLUTION) ×3 IMPLANT

## 2023-06-03 NOTE — Anesthesia Postprocedure Evaluation (Signed)
Anesthesia Post Note  Patient: [name]  Procedure(s) Performed: RIGHT TOTAL HIP ARTHROPLASTY REVISION, POSTERIOR (Right: Hip)     Patient location during evaluation: PACU Anesthesia Type: MAC and Spinal Level of consciousness: awake and alert Pain management: pain level controlled Vital Signs Assessment: post-procedure vital signs reviewed and stable Respiratory status: spontaneous breathing and respiratory function stable Cardiovascular status: blood pressure returned to baseline and stable Postop Assessment: spinal receding Anesthetic complications: no  No notable events documented.  Last Vitals:  Vitals:   06/03/23 1615 06/03/23 1630  BP: 130/64 137/63  Pulse: (!) 55 (!) 57  Resp: 13 15  Temp:    SpO2: 99% 99%    Last Pain:  Vitals:   06/03/23 1530  TempSrc:   PainSc: 0-No pain                 Eyob Godlewski DANIEL

## 2023-06-03 NOTE — Transfer of Care (Signed)
Immediate Anesthesia Transfer of Care Note  Patient: [name]  Procedure(s) Performed: RIGHT TOTAL HIP ARTHROPLASTY REVISION, POSTERIOR (Right: Hip)  Patient Location: PACU  Anesthesia Type:Spinal  Level of Consciousness: awake, alert , and oriented  Airway & Oxygen Therapy: Patient Spontanous Breathing  Post-op Assessment: Report given to RN and Post -op Vital signs reviewed and stable  Post vital signs: Reviewed and stable  Last Vitals:  Vitals Value Taken Time  BP 92/53 06/03/23 1522  Temp    Pulse    Resp 22 06/03/23 1526  SpO2    Vitals shown include unvalidated device data.  Last Pain:  Vitals:   06/03/23 1049  TempSrc:   PainSc: 0-No pain         Complications: No notable events documented.

## 2023-06-03 NOTE — Op Note (Signed)
RIGHT TOTAL HIP ARTHROPLASTY REVISION, POSTERIOR  Procedure Note [name]   [id number]  Pre-op Diagnosis: recalled right total hip arthroplasty, metal on metal bearing     Post-op Diagnosis: same   Operative Procedures  1.  Revision of right total hip replacement including acetabular cup, polyethylene liner and femoral head  Operative findings: Excellent bony ingrowth of the ASR cup and SROM stem Minimal metallosis of the surrounding tissues without pseudotumor Joint effusion Deficiency of medial acetabular wall, anterior acetabular wall Intact posterior and anterior acetabular columns  Explants: Depuy 50 mm ASR cup 45 mm +0 metal with head  Surgeon: Gershon Mussel, M.D.  Assistant: Tessa Lerner, PA-C, necessary due to the complexity and timely completion of the procedure   Anesthesia: spinal  Prosthesis: Depuy Acetabulum: Pinnacle multihole revision cup 54 mm, 30 mm cancellous screw, 50 mm cancellous screw, +4 polyethylene liner with 10 degree lip Head: 36 mm size: +6 Bearing Type: Ceramic on poly  Date of Service: 06/03/2023   Indication: 89 y.o. year old female with a history of hip pain from a recalled metal on metal total hip replacement that has failed conservative management.  The index surgery was done in 2008 by one of my partners.  After risk and benefits of a revision total hip arthroplasty were explained, the patient elected to proceed with after voicing understanding.  Procedure:  After informed consent was obtained and understanding of the risk were voiced including but not limited to bleeding, infection, damage to surrounding structures including nerves and vessels, blood clots, leg length inequality, dislocation and the failure to achieve desired results, the operative extremity was marked with verbal confirmation of the patient in the holding area.   The patient was then brought to the operating room and transported to the operating room table and  placed in the lateral decubitus position. The operative limb was then prepped and draped in the usual sterile fashion and preoperative antibiotics were administered.  A time out was performed prior to the start of surgery confirming the correct extremity, preoperative antibiotic administration, as well as team members, implants and instruments available for the case. Correct surgical site was also confirmed with preoperative radiographs. The previous surgical scar was incised.  A standard posterior approach to the hip was performed.  The IT band was divided in line with the incision.  Trochanteric bursectomy performed.  Scar adhesions excised to mobilize the IT band.  Charnley retractor was placed to protect the sciatic nerve.  The posterior pseudocapsule was elevated off of the posterior trochanter and proximal femur.   Joint effusion was encountered and evacuated.  There was no metallosis to the tissues.  Scar tissue was excised from inside the joint and around the neck of the femoral implant.  The hip was dislocated and the femoral head was carefully tamped off the trunnion.  There was no trunnionosis.  The femur was retracted anteriorly.  The acetabulum was exposed.  The cup was well-fixed.  The stem was also well-fixed.  A cup cutter was then used to extract the acetabular component.  There was a small amount of bone loss from the anterior wall during the extraction.  The columns remained intact.  Medial deficient.  There was a small amount of metallosis to the tissue.  Careful debridement was performed with a pituitary rondure.  There was an uncontained cavitary lesion of the anterior wall.  The superior acetabular dome was clearly intact.  There was also a small cavitary lesion in the  posterior wall that was contained.  We then turned our attention to reaming.  We did sequential reaming up to a size 53 mm reamer.  Surgical site was thoroughly irrigated with dilute Betadine solution.  We changed gloves.  I  first attempted to use a 54 mm Pinnacle revision cup.  Unfortunately I could not gain any fixation.  We then used a 54 mm multihole Pinnacle cup which gave excellent fixation.  The cup was placed in approximately 30 degrees of anteversion and 45 degrees of inclination.  This was checked under fluoroscopy.  I was able to achieve excellent fixation with 2 cancellous screws.  A lateralized liner with a 10 degree lip was impacted into place with a lip around the 7 o'clock position.  We then turned our attention to trialing different femoral head lengths.  We found that a +6 head ball restored soft tissue tension as well as clinical leg lengths.  There was no shuck.  Combined anteversion was 40 degrees.  Hip was stable to 90 degrees of flexion greater than 60 degrees of internal rotation.  The trial femoral head was removed and the surgical site was once again irrigated with dilute Betadine solution.  Final femoral head was impacted onto the trunnion.  Hip was reduced.  Surgical site was thoroughly irrigated again with pulse lavage.  A topical anesthetic and vancomycin powder was placed deep to the fascia.  The IT band was closed with #1 PDS, #0 Vicryl for the deep fat layer, and 2.0 monocryl Plus for the subcutaneous tissue. The skin was closed with staples.  A sterile dressing was applied. The patient was awakened and transported to the recovery room in stable condition. All sponge, needle, and instrument counts were correct at the end of the case.  Position: lateral decubitus   Complications: none.  Time Out: performed   Drains/Packing: None  Estimated blood loss: 150 cc  Returned to Recovery Room: in good condition.   Antibiotics: yes   Mechanical VTE (DVT) Prophylaxis: sequential compression devices, TED thigh-high  Chemical VTE (DVT) Prophylaxis: xarelto POD 1  Fluid Replacement  Crystalloid: See anesthesia record Blood: none  FFP: none   Specimens Removed: 1 to pathology   Sponge and  Instrument Count Correct? yes   PACU: portable radiograph - low AP pelvis  Admission: inpatient status, start PT & OT POD#1  Plan/RTC: Return in 2 weeks for wound check.  Weight Bearing/Load Lower Extremity: Touchdown weightbearing Posterior hip precautions  N. Glee Arvin, MD [facility] 3:13 PM

## 2023-06-03 NOTE — Anesthesia Procedure Notes (Signed)
Spinal  Patient location during procedure: OR Start time: 06/03/2023 12:23 PM End time: 06/03/2023 12:32 PM Reason for block: surgical anesthesia Staffing Performed: anesthesiologist  Anesthesiologist: Heather Roberts, MD Performed by: Heather Roberts, MD Authorized by: Heather Roberts, MD   Preanesthetic Checklist Completed: patient identified, IV checked, risks and benefits discussed, surgical consent, monitors and equipment checked, pre-op evaluation and timeout performed Spinal Block Patient position: sitting Prep: DuraPrep Patient monitoring: cardiac monitor, continuous pulse ox and blood pressure Approach: right paramedian Location: L3-4 Injection technique: single-shot Needle Needle type: Pencan  Needle gauge: 24 G Needle length: 9 cm Assessment Events: CSF return Additional Notes Functioning IV was confirmed and monitors were applied. Sterile prep and drape, including hand hygiene and sterile gloves were used. The patient was positioned and the spine was prepped. The skin was anesthetized with lidocaine.  Free flow of clear CSF was obtained prior to injecting local anesthetic into the CSF.  The spinal needle aspirated freely following injection.  The needle was carefully withdrawn.  The patient tolerated the procedure well. Difficult placement.

## 2023-06-03 NOTE — Progress Notes (Signed)
Pt. admitted to unit following right total hip arthroplasty revision. Pt. A&O x 4 and is able to communicate wants/needs to staff effectively. Pt. oriented to room and use of call light. No c/o pain or s/s of distress noted. Bed placed in lowest position & call light within reach. Will continue to monitor for changes and report changes to MD accordingly.

## 2023-06-03 NOTE — H&P (Signed)
PREOPERATIVE H&P  Chief Complaint: failed right total hip arthroplasty  HPI: [name] is a 89 y.o. female who presents for surgical treatment of failed right total hip arthroplasty.  She denies any changes in medical history.  Past Medical History:  Diagnosis Date   Allergies    Allergy    Anxiety    Arthritis    Atrophic vaginitis    Cancer (HCC)    throat cancer   Cataract    hx/o surgery, both eyes; Dr. Nile Riggs   Complication of anesthesia    Diverticulosis    per 2010 colonoscopy   Dyslipidemia    Full dentures    GERD (gastroesophageal reflux disease)    hx/o, resolved   Glaucoma 2016   H/O echocardiogram 06/22/2010   normal LVEF, moderate mitral and tricuspid regurg 07/2016 Dr. Rennis Golden;  mild aortic valve stenosis, left ventricular normal, EF 55%; Dr. Allyson Sabal  06/2010   History of cardiovascular stress test 06/22/2010   normal bruce myocadial perfusion study, 72% EF; Dr. Allyson Sabal   History of mammogram    benign bilat calcifications, heterogeneously dense breasts, stable mammograms 2009-2014   History of pneumonia    walking pneumonia "years ago"   History of uterine cancer 12/2002   s/p TAH and BSO, pelvic and periarotic lymphadenectomy 12/2002, no adjuvant therapy; stage 1b carcinosarcoma of uterus, completed 5 years of f/u as of 2008; Dr. De Blanch gyencology oncology [place]; Dr. 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 Alanda Amass   Osteoporosis    PONV (postoperative nausea and vomiting)    SVT (supraventricular tachycardia)    asymptomatic 2014, hx/o SVT, Dr. Alanda Amass   Vitamin D deficiency    Past Surgical History:  Procedure Laterality Date   ABDOMINAL HYSTERECTOMY Bilateral    h/o uterine cancer; oophrectomy   CATARACT EXTRACTION     bilat   COLONOSCOPY  08/2016   08/2016  Dr. Elnoria Howard advised no repeat  colonoscopy;  scattered diverticla, medium hemorrhoids 2010; Dr. Elnoria Howard   JOINT REPLACEMENT     NM MYOCAR PERF WALL MOTION     THROAT SURGERY     some tissue removed from throat at [facility]   TONSILLECTOMY Right 12/28/2019   Procedure: TONSILLECTOMY;  Surgeon: Christia Reading, MD;  Location: [facility];  Service: ENT;  Laterality: Right;   TOTAL HIP ARTHROPLASTY Bilateral 2008, 2010    twice left (complication on the left, once on the right; Dr. Dorene Grebe   TRANSTHORACIC ECHOCARDIOGRAM  02/2013   ordered for SVT; EF 55-65%; calcified MV annulus, mod MR; RSVP increased; RA mildly dilated; mod TR; PA peak pressure   Social History   Socioeconomic History   Marital status: Married    Spouse name: Not on file   Number of children: 1   Years of education: Not on file   Highest education level: Not on file  Occupational History   Not on file  Tobacco Use   Smoking status: Never   Smokeless tobacco: Never  Vaping Use   Vaping Use: Never used  Substance and Sexual Activity   Alcohol use: No   Drug use: No   Sexual activity: Never    Birth control/protection: Surgical    Comment: walks 3x/wk, retired, married, 2 grandchildren  Other Topics Concern   Not on file  Social History Narrative  Married, 1 son, 2 grandchildren; exercise 4 days per week - walking, mall and [employer].  Husband has hx/o CVA, but is ambulatory.  06/2017   Social Determinants of Health   Financial Resource Strain: Not on file  Food Insecurity: Not on file  Transportation Needs: Not on file  Physical Activity: Not on file  Stress: Not on file  Social Connections: Not on file   Family History  Family history unknown: Yes   Allergies  Allergen Reactions   Other Nausea Only    "medicines with steroids in it make me nauseous"   Prednisone Nausea And Vomiting   Prior to Admission medications   Medication Sig Start Date End Date Taking? Authorizing Provider  aspirin 81 MG EC tablet Take  81 mg by mouth in the morning.   Yes [provider]  atenolol (TENORMIN) 50 MG tablet TAKE 1 TABLET BY MOUTH EVERY DAY Patient taking differently: Take 50 mg by mouth at bedtime. 08/25/21  Yes Barnie Del R, NP  brimonidine (ALPHAGAN P) 0.1 % SOLN Place 1 drop into both eyes in the morning and at bedtime. 07/06/20  Yes [provider]  Cholecalciferol (VITAMIN D3 PO) Take 1,000 Units by mouth in the morning.   Yes [provider]  docusate sodium (COLACE) 100 MG capsule Take 1 capsule (100 mg total) by mouth daily as needed. 05/29/23 05/28/24 Yes Cristie Hem, PA-C  HYDROcodone-acetaminophen (NORCO) 5-325 MG tablet Take 1-2 tablets by mouth 3 (three) times daily as needed. To be taken after surgery 05/29/23   Cristie Hem, PA-C  latanoprost (XALATAN) 0.005 % ophthalmic solution Place 1 drop into both eyes at bedtime.   Yes [provider]  levocetirizine (XYZAL) 5 MG tablet Take 5 mg by mouth every evening.   Yes [provider]  lisinopril (ZESTRIL) 40 MG tablet Take 1 tablet (40 mg total) by mouth daily. 01/28/23  Yes Monge, Petra Kuba, NP  methocarbamol (ROBAXIN) 500 MG tablet Take 1 tablet (500 mg total) by mouth 2 (two) times daily as needed. 05/29/23   Cristie Hem, PA-C  ondansetron (ZOFRAN) 4 MG tablet Take 1 tablet (4 mg total) by mouth every 8 (eight) hours as needed for nausea or vomiting. 05/29/23   Cristie Hem, PA-C  OVER THE COUNTER MEDICATION Take 1,000 mg by mouth in the morning. [place]   Yes [provider]  rivaroxaban (XARELTO) 10 MG TABS tablet Take 1 tablet (10 mg total) by mouth daily. To be taken after surgery to prevent blood clots 05/29/23   Cristie Hem, PA-C  simvastatin (ZOCOR) 40 MG tablet TAKE 1 TABLET BY MOUTH EVERYDAY AT BEDTIME 08/15/20  Yes Hilts, Michael, MD  acetaminophen (TYLENOL) 500 MG tablet Take 1,000 mg by mouth every 8 (eight) hours as needed for mild pain.    [provider]     Positive ROS: All other systems have been reviewed and were otherwise negative with the exception of those mentioned in the HPI and as above.  Physical Exam: General: Alert, no acute distress Cardiovascular: No pedal edema Respiratory: No cyanosis, no use of accessory musculature GI: abdomen soft Skin: No lesions in the area of chief complaint Neurologic: Sensation intact distally Psychiatric: Patient is competent for consent with normal mood and affect Lymphatic: no lymphedema  MUSCULOSKELETAL: exam stable  Assessment: failed right total hip arthroplasty  Plan: Plan for Procedure(s): RIGHT TOTAL HIP ARTHROPLASTY REVISION, POSTERIOR  The risks benefits and alternatives were  discussed with the patient including but not limited to the risks of nonoperative treatment, versus surgical intervention including infection, bleeding, nerve injury,  blood clots, cardiopulmonary complications, morbidity, mortality, among others, and they were willing to proceed.   Glee Arvin, MD 06/03/2023 11:13 AM

## 2023-06-03 NOTE — Discharge Instructions (Signed)
INSTRUCTIONS AFTER JOINT REPLACEMENT   Remove items at home which could result in a fall. This includes throw rugs or furniture in walking pathways ICE to the affected joint every three hours while awake for 30 minutes at a time, for at least the first 3-5 days, and then as needed for pain and swelling.  Continue to use ice for pain and swelling. You may notice swelling that will progress down to the foot and ankle.  This is normal after surgery.  Elevate your leg when you are not up walking on it.   Continue to use the breathing machine you got in the hospital (incentive spirometer) which will help keep your temperature down.  It is common for your temperature to cycle up and down following surgery, especially at night when you are not up moving around and exerting yourself.  The breathing machine keeps your lungs expanded and your temperature down.   DIET:  As you were doing prior to hospitalization, we recommend a well-balanced diet.  DRESSING / WOUND CARE / SHOWERING  Keep the surgical dressing until follow up.  The dressing is water proof, so you can shower without any extra covering.  IF THE DRESSING FALLS OFF or the wound gets wet inside, change the dressing with sterile gauze.  Please use good hand washing techniques before changing the dressing.  Do not use any lotions or creams on the incision until instructed by your surgeon.    ACTIVITY  Increase activity slowly as tolerated, but follow the weight bearing instructions below.   No driving for 6 weeks or until further direction given by your physician.  You cannot drive while taking narcotics.  No lifting or carrying greater than 10 lbs. until further directed by your surgeon. Avoid periods of inactivity such as sitting longer than an hour when not asleep. This helps prevent blood clots.  You may return to work once you are authorized by your doctor.     WEIGHT BEARING   Touchdown weight-bearing with a walker to the right lower  extremity   EXERCISES  Results after joint replacement surgery are often greatly improved when you follow the exercise, range of motion and muscle strengthening exercises prescribed by your doctor. Safety measures are also important to protect the joint from further injury. Any time any of these exercises cause you to have increased pain or swelling, decrease what you are doing until you are comfortable again and then slowly increase them. If you have problems or questions, call your caregiver or physical therapist for advice.   Rehabilitation is important following a joint replacement. After just a few days of immobilization, the muscles of the leg can become weakened and shrink (atrophy).  These exercises are designed to build up the tone and strength of the thigh and leg muscles and to improve motion. Often times heat used for twenty to thirty minutes before working out will loosen up your tissues and help with improving the range of motion but do not use heat for the first two weeks following surgery (sometimes heat can increase post-operative swelling).   These exercises can be done on a training (exercise) mat, on the floor, on a table or on a bed. Use whatever works the best and is most comfortable for you.    Use music or television while you are exercising so that the exercises are a pleasant break in your day. This will make your life better with the exercises acting as a break in your routine that  you can look forward to.   Perform all exercises about fifteen times, three times per day or as directed.  You should exercise both the operative leg and the other leg as well.  Exercises include:   Quad Sets - Tighten up the muscle on the front of the thigh (Quad) and hold for 5-10 seconds.   Straight Leg Raises - With your knee straight (if you were given a brace, keep it on), lift the leg to 60 degrees, hold for 3 seconds, and slowly lower the leg.  Perform this exercise against resistance later  as your leg gets stronger.  Hamstring Strength:  Lying on your back, push your heel against the floor with your leg straight by tightening up the muscles of your buttocks.  Repeat, but this time bend your knee to a comfortable angle, and push your heel against the floor.  You may put a pillow under the heel to make it more comfortable if necessary.   A rehabilitation program following joint replacement surgery can speed recovery and prevent re-injury in the future due to weakened muscles. Contact your doctor or a physical therapist for more information on knee rehabilitation.    CONSTIPATION  Constipation is defined medically as fewer than three stools per week and severe constipation as less than one stool per week.  Even if you have a regular bowel pattern at home, your normal regimen is likely to be disrupted due to multiple reasons following surgery.  Combination of anesthesia, postoperative narcotics, change in appetite and fluid intake all can affect your bowels.   YOU MUST use at least one of the following options; they are listed in order of increasing strength to get the job done.  They are all available over the counter, and you may need to use some, POSSIBLY even all of these options:    Drink plenty of fluids (prune juice may be helpful) and high fiber foods Colace 100 mg by mouth twice a day  Senokot for constipation as directed and as needed Dulcolax (bisacodyl), take with full glass of water  Miralax (polyethylene glycol) once or twice a day as needed.  If you have tried all these things and are unable to have a bowel movement in the first 3-4 days after surgery call either your surgeon or your primary doctor.    If you experience loose stools or diarrhea, hold the medications until you stool forms back up.  If your symptoms do not get better within 1 week or if they get worse, check with your doctor.  If you experience "the worst abdominal pain ever" or develop nausea or vomiting,  please contact the office immediately for further recommendations for treatment.   ITCHING:  If you experience itching with your medications, try taking only a single pain pill, or even half a pain pill at a time.  You can also use Benadryl over the counter for itching or also to help with sleep.   TED HOSE STOCKINGS:  Use stockings on both legs until for at least 2 weeks or as directed by physician office. They may be removed at night for sleeping.  MEDICATIONS:  See your medication summary on the "After Visit Summary" that nursing will review with you.  You may have some home medications which will be placed on hold until you complete the course of blood thinner medication.  It is important for you to complete the blood thinner medication as prescribed.  PRECAUTIONS:  If you experience chest pain or  shortness of breath - call 911 immediately for transfer to the hospital emergency department.   If you develop a fever greater that 101 F, purulent drainage from wound, increased redness or drainage from wound, foul odor from the wound/dressing, or calf pain - CONTACT YOUR SURGEON.                                                   FOLLOW-UP APPOINTMENTS:  If you do not already have a post-op appointment, please call the office for an appointment to be seen by your surgeon.  Guidelines for how soon to be seen are listed in your "After Visit Summary", but are typically between 1-4 weeks after surgery.  OTHER INSTRUCTIONS:   Knee Replacement:  Do not place pillow under knee, focus on keeping the knee straight while resting. CPM instructions: 0-90 degrees, 2 hours in the morning, 2 hours in the afternoon, and 2 hours in the evening. Place foam block, curve side up under heel at all times except when in CPM or when walking.  DO NOT modify, tear, cut, or change the foam block in any way.  POST-OPERATIVE OPIOID TAPER INSTRUCTIONS: It is important to wean off of your opioid medication as soon as possible.  If you do not need pain medication after your surgery it is ok to stop day one. Opioids include: Codeine, Hydrocodone(Norco, Vicodin), Oxycodone(Percocet, oxycontin) and hydromorphone amongst others.  Long term and even short term use of opiods can cause: Increased pain response Dependence Constipation Depression Respiratory depression And more.  Withdrawal symptoms can include Flu like symptoms Nausea, vomiting And more Techniques to manage these symptoms Hydrate well Eat regular healthy meals Stay active Use relaxation techniques(deep breathing, meditating, yoga) Do Not substitute Alcohol to help with tapering If you have been on opioids for less than two weeks and do not have pain than it is ok to stop all together.  Plan to wean off of opioids This plan should start within one week post op of your joint replacement. Maintain the same interval or time between taking each dose and first decrease the dose.  Cut the total daily intake of opioids by one tablet each day Next start to increase the time between doses. The last dose that should be eliminated is the evening dose.   MAKE SURE YOU:  Understand these instructions.  Get help right away if you are not doing well or get worse.    Thank you for letting us be a part of your medical care team.  It is a privilege we respect greatly.  We hope these instructions will help you stay on track for a fast and full recovery!     Information on my medicine - XARELTO (Rivaroxaban)  This medication education was reviewed with me or my healthcare representative as part of my discharge preparation.  Why was Xarelto prescribed for you? Xarelto was prescribed for you to reduce the risk of blood clots forming after orthopedic surgery. The medical term for these abnormal blood clots is venous thromboembolism (VTE).  What do you need to know about xarelto ? Take your Xarelto ONCE DAILY at the same time every day. You may take it  either with or without food.  If you have difficulty swallowing the tablet whole, you may crush it and mix in applesauce just prior to taking your  dose.  Take Xarelto exactly as prescribed by your doctor and DO NOT stop taking Xarelto without talking to the doctor who prescribed the medication.  Stopping without other VTE prevention medication to take the place of Xarelto may increase your risk of developing a clot.  After discharge, you should have regular check-up appointments with your healthcare provider that is prescribing your Xarelto.    What do you do if you miss a dose? If you miss a dose, take it as soon as you remember on the same day then continue your regularly scheduled once daily regimen the next day. Do not take two doses of Xarelto on the same day.   Important Safety Information A possible side effect of Xarelto is bleeding. You should call your healthcare provider right away if you experience any of the following: Bleeding from an injury or your nose that does not stop. Unusual colored urine (red or dark brown) or unusual colored stools (red or black). Unusual bruising for unknown reasons. A serious fall or if you hit your head (even if there is no bleeding).  Some medicines may interact with Xarelto and might increase your risk of bleeding while on Xarelto. To help avoid this, consult your healthcare provider or pharmacist prior to using any new prescription or non-prescription medications, including herbals, vitamins, non-steroidal anti-inflammatory drugs (NSAIDs) and supplements.  This website has more information on Xarelto: VisitDestination.com.br.

## 2023-06-04 ENCOUNTER — Encounter (HOSPITAL_COMMUNITY): Payer: Self-pay | Admitting: Orthopaedic Surgery

## 2023-06-04 LAB — CBC
HCT: 30.8 % — ABNORMAL LOW (ref 36.0–46.0)
Hemoglobin: 10.1 g/dL — ABNORMAL LOW (ref 12.0–15.0)
MCH: 29.6 pg (ref 26.0–34.0)
MCHC: 32.8 g/dL (ref 30.0–36.0)
MCV: 90.3 fL (ref 80.0–100.0)
Platelets: 156 10*3/uL (ref 150–400)
RBC: 3.41 MIL/uL — ABNORMAL LOW (ref 3.87–5.11)
RDW: 14.3 % (ref 11.5–15.5)
WBC: 9.3 10*3/uL (ref 4.0–10.5)
nRBC: 0 % (ref 0.0–0.2)

## 2023-06-04 NOTE — TOC Initial Note (Signed)
Transition of Care (TOC) - Initial/Assessment Note   Spoke to patient and family at bedside.   MD office arranged HHPT with [facility]. Patient and family aware. [name] is contact person [phone] confirmed number with family. Amy with [facility] aware.   Patient will need walker and bedside commode, ordered with Jermain with [employer]  Patient Details  Name: [name] MRN: [id number] Date of Birth: 1933-10-30  Transition of Care [facility]) CM/SW Contact:    Kingsley Plan, RN Phone Number: 06/04/2023, 9:58 AM  Clinical Narrative:                   Expected Discharge Plan: Home w Home Health Services Barriers to Discharge: No Barriers Identified   Patient Goals and CMS Choice Patient states their goals for this hospitalization and ongoing recovery are:: to return to home CMS Medicare.gov Compare Post Acute Care list provided to:: Patient Choice offered to / list presented to : Patient [place] ownership interest in [facility].provided to:: Patient    Expected Discharge Plan and Services   Discharge Planning Services: CM Consult Post Acute Care Choice: Home Health Living arrangements for the past 2 months: Single Family Home Expected Discharge Date: 06/04/23               DME Arranged: Cherre Huger rolling DME Agency: [employer] Date DME Agency Contacted: 06/04/23 Time DME Agency Contacted: [phone] Representative spoke with at DME Agency: Lelon Mast HH Arranged: PT HH Agency: [facility] Date [facility] Agency Contacted: 06/04/23 Time HH Agency Contacted: [phone] Representative spoke with at [facility] Agency: Amy  Prior Living Arrangements/Services Living arrangements for the past 2 months: Single Family Home Lives with:: Spouse Patient language and need for interpreter reviewed:: Yes Do you feel safe going back to the place where you live?: Yes      Need for Family Participation in Patient Care: Yes (Comment) Care giver support system in  place?: Yes (comment)   Criminal Activity/Legal Involvement Pertinent to Current Situation/Hospitalization: No - Comment as needed  Activities of Daily Living Home Assistive Devices/Equipment: None ADL Screening (condition at time of admission) Patient's cognitive ability adequate to safely complete daily activities?: Yes Is the patient deaf or have difficulty hearing?: Yes Does the patient have difficulty seeing, even when wearing glasses/contacts?: No Does the patient have difficulty concentrating, remembering, or making decisions?: No Patient able to express need for assistance with ADLs?: Yes Does the patient have difficulty dressing or bathing?: No Independently performs ADLs?: Yes (appropriate for developmental age) Does the patient have difficulty walking or climbing stairs?: Yes Weakness of Legs: Both Weakness of Arms/Hands: None  Permission Sought/Granted   Permission granted to share information with : Yes, Verbal Permission Granted  Share Information with NAME: family at bedside           Emotional Assessment Appearance:: Appears stated age Attitude/Demeanor/Rapport: Engaged Affect (typically observed): Accepting Orientation: : Oriented to Self, Oriented to Place, Oriented to  Time, Oriented to Situation Alcohol / Substance Use: Not Applicable Psych Involvement: No (comment)  Admission diagnosis:  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 joint [Z96.641] Patient Active Problem List   Diagnosis Date Noted   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 joint 06/03/2023   Failure of recalled hardware of right total hip arthroplasty (HCC) 06/02/2023   Pharyngeal dysphagia 04/04/2020   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   Tonsil asymmetry 11/03/2019   Abnormal chest x-ray 08/12/2019  Encounter for health maintenance examination in adult 08/12/2019   Need for influenza vaccination 08/12/2019   Chest wall pain 06/11/2019   Muscle strain 06/11/2019    Abnormal EKG 06/11/2019   Murmur 09/05/2018   Aortic atherosclerosis (HCC) 08/04/2018   Elevated ferritin 08/04/2018   Other fatigue 07/14/2018   Iron disorder 07/14/2018   Primary open angle glaucoma of both eyes, mild stage 10/14/2017   Glaucoma 07/11/2017   Vaccine counseling 07/11/2017   Varicose vein of leg 07/11/2017   Need for shingles vaccine 07/11/2017   Hearing loss 06/27/2016   Medicare annual wellness visit, subsequent 06/26/2016   Osteoporosis 01/12/2016   Dyslipidemia 06/14/2015   Essential hypertension 06/14/2015   Mitral valve regurgitation 06/14/2015   Tricuspid valve insufficiency 06/14/2015   Vitamin D deficiency 06/14/2015   Rhinitis, allergic 06/14/2015   Renal insufficiency 06/14/2015   Disorders of both mitral and tricuspid valves 03/04/2015   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 [place] [address] Phone: [phone] Fax: [phone]     Social Determinants of Health (SDOH) Social History: SDOH Screenings   Food Insecurity: No Food Insecurity (06/03/2023)  Housing: Low Risk  (06/03/2023)  Transportation Needs: No Transportation Needs (06/03/2023)  Utilities: Not At Risk (06/03/2023)  Depression (PHQ2-9): Low Risk  (08/12/2019)  Tobacco Use: Low Risk  (06/03/2023)   SDOH Interventions:     Readmission Risk Interventions     No data to display

## 2023-06-04 NOTE — Progress Notes (Addendum)
Subjective: 1 Day Post-Op Procedure(s) (LRB): RIGHT TOTAL HIP ARTHROPLASTY REVISION, POSTERIOR (Right) Patient reports pain as mild.    Objective: Vital signs in last 24 hours: Temp:  [97.5 F (36.4 C)-99.1 F (37.3 C)] 99.1 F (37.3 C) (06/18 0428) Pulse Rate:  [50-86] 76 (06/18 0428) Resp:  [13-20] 18 (06/18 0428) BP: (102-171)/(52-81) 134/65 (06/18 0428) SpO2:  [96 %-100 %] 100 % (06/18 0428) Weight:  [64.4 kg] 64.4 kg (06/17 1031)  Intake/Output from previous day: 06/17 0701 - 06/18 0700 In: 2365.5 [I.V.:1615.5; IV Piggyback:750] Out: 700 [Urine:450; Blood:250] Intake/Output this shift: No intake/output data recorded.  Recent Labs    06/04/23 0028  HGB 10.1*   Recent Labs    06/04/23 0028  WBC 9.3  RBC 3.41*  HCT 30.8*  PLT 156   No results for input(s): "NA", "K", "CL", "CO2", "BUN", "CREATININE", "GLUCOSE", "CALCIUM" in the last 72 hours. No results for input(s): "LABPT", "INR" in the last 72 hours.  Neurologically intact Neurovascular intact Sensation intact distally Intact pulses distally Dorsiflexion/Plantar flexion intact Incision: dressing C/D/I No cellulitis present Compartment soft   Assessment/Plan: 1 Day Post-Op Procedure(s) (LRB): RIGHT TOTAL HIP ARTHROPLASTY REVISION, POSTERIOR (Right) Advance diet Up with therapy D/C IV fluids Discharge home with home health once she has urinated AND has cleared PT.  Patient eager to go home today TDWB RLE-posterior hip precautions x 3 months Knee immobilizer while inpatient ABLA- mild and stable F/u with PA Dub Mikes 2 weeks post-op       Cristie Hem 06/04/2023, 7:56 AM

## 2023-06-04 NOTE — Plan of Care (Signed)
  Problem: Education: Goal: Knowledge of the prescribed therapeutic regimen will improve Outcome: Progressing Goal: Understanding of discharge needs will improve Outcome: Progressing   Problem: Activity: Goal: Ability to avoid complications of mobility impairment will improve Outcome: Progressing Goal: Ability to tolerate increased activity will improve Outcome: Progressing   Problem: Clinical Measurements: Goal: Postoperative complications will be avoided or minimized Outcome: Progressing   Problem: Pain Management: Goal: Pain level will decrease with appropriate interventions Outcome: Progressing   Problem: Skin Integrity: Goal: Will show signs of wound healing Outcome: Progressing   Problem: Education: Goal: Knowledge of General Education information will improve Description: Including pain rating scale, medication(s)/side effects and non-pharmacologic comfort measures Outcome: Progressing   Problem: Health Behavior/Discharge Planning: Goal: Ability to manage health-related needs will improve Outcome: Progressing   Problem: Clinical Measurements: Goal: Ability to maintain clinical measurements within normal limits will improve Outcome: Progressing Goal: Will remain free from infection Outcome: Progressing Goal: Diagnostic test results will improve Outcome: Progressing   Problem: Activity: Goal: Risk for activity intolerance will decrease Outcome: Progressing   Problem: Nutrition: Goal: Adequate nutrition will be maintained Outcome: Progressing   Problem: Coping: Goal: Level of anxiety will decrease Outcome: Progressing   Problem: Pain Managment: Goal: General experience of comfort will improve Outcome: Progressing   Problem: Safety: Goal: Ability to remain free from injury will improve Outcome: Progressing   Problem: Skin Integrity: Goal: Risk for impaired skin integrity will decrease Outcome: Progressing

## 2023-06-04 NOTE — Evaluation (Signed)
Occupational Therapy Evaluation and Discharge Patient Details Name: [name] MRN: [id number] DOB: [birth year] Today's Date: 06/04/2023   History of Present Illness [name] is a 89 y.o. female who presents for surgical treatment of failed right total hip arthroplasty; s/p revision of right total hip replacement including acetabular cup, polyethylene liner and femoral head   Clinical Impression   This 89 yo female admitted and underwent above presents to acute OT with all education completed with pt and family. We will D/C from acute OT.      Recommendations for follow up therapy are one component of a multi-disciplinary discharge planning process, led by the attending physician.  Recommendations may be updated based on patient status, additional functional criteria and insurance authorization.   Assistance Recommended at Discharge Frequent or constant Supervision/Assistance  Patient can return home with the following A little help with walking and/or transfers;A little help with bathing/dressing/bathroom;Help with stairs or ramp for entrance;Assist for transportation;Assistance with cooking/housework    Functional Status Assessment  Patient has had a recent decline in their functional status and demonstrates the ability to make significant improvements in function in a reasonable and predictable amount of time.  Equipment Recommendations  BSC/3in1       Precautions / Restrictions Precautions Precautions: Posterior Hip Precaution Booklet Issued: Yes (comment) Required Braces or Orthoses: Knee Immobilizer - Right (to wear while in bed) Knee Immobilizer - Right:  (while inpt) Restrictions Weight Bearing Restrictions: Yes RLE Weight Bearing: Touchdown weight bearing      Mobility Bed Mobility Overal bed mobility: Needs Assistance Bed Mobility: Supine to Sit     Supine to sit: Min guard, HOB elevated     General bed mobility comments: VCs for sequencing     Transfers Overall transfer level: Needs assistance   Transfers: Sit to/from Stand, Bed to chair/wheelchair/BSC Sit to Stand: Min assist, Min guard     Step pivot transfers: Min assist, Min guard     General transfer comment: Cues for positioning for post precautions, and for hand placement; Min/minguard assist for safety and to steady RW; tends to put more than TWB RLE, so cued to "not touch teh floor"; able to take pivot steps with simultaneous demonstration; with solely VCs, tends to take small hop steps; performed sit<>stand at least 3x, and stand/step pivot transfer with RW x2-3x; difficulty with moving hands form armrest to RW      Balance Overall balance assessment: Needs assistance Sitting-balance support: No upper extremity supported, Feet supported Sitting balance-Leahy Scale: Good     Standing balance support: Bilateral upper extremity supported, Reliant on assistive device for balance Standing balance-Leahy Scale: Poor                             ADL either performed or assessed with clinical judgement   ADL Overall ADL's : Needs assistance/impaired Eating/Feeding: Independent;Sitting   Grooming: Set up;Sitting   Upper Body Bathing: Set up;Sitting   Lower Body Bathing: Minimal assistance Lower Body Bathing Details (indicate cue type and reason): min guard A sit<>stand; needs A with lower part of lower legs and feet so as not to "break" hip precautions Upper Body Dressing : Set up;Sitting   Lower Body Dressing: Min guard;Adhering to hip precautions;With adaptive equipment;Sit to/from stand Lower Body Dressing Details (indicate cue type and reason): reacher to donn underwear and pants Toilet Transfer: Minimal assistance;Stand-pivot Toilet Transfer Details (indicate cue type and reason): for stand pivot or  stand hop; VCs for safe hand placement Toileting- Clothing Manipulation and Hygiene: Min guard;Sit to/from stand Toileting - Clothing Manipulation  Details (indicate cue type and reason): VCs for safe hand placement   Tub/Shower Transfer Details (indicate cue type and reason): Demonstrated to pt and family hopping backwards into a shower stall with RW         Vision Patient Visual Report: No change from baseline              Pertinent Vitals/Pain Pain Assessment Pain Assessment: Faces Faces Pain Scale: Hurts a little bit Pain Location: Occasional grimace with mobility Pain Descriptors / Indicators: Grimacing Pain Intervention(s): Monitored during session, Repositioned     Hand Dominance Right   Extremity/Trunk Assessment Upper Extremity Assessment Upper Extremity Assessment: Overall WFL for tasks assessed   Lower Extremity Assessment Lower Extremity Assessment: RLE deficits/detail RLE Deficits / Details: Overall mvoing RLE without too much discomfort;       Communication Communication Communication: No difficulties   Cognition Arousal/Alertness: Awake/alert Behavior During Therapy: WFL for tasks assessed/performed                                   General Comments: Difficulty recalling and maintaining WB'ing restrictions intermittently                Home Living Family/patient expects to be discharged to:: Private residence Living Arrangements: Spouse/significant other Available Help at Discharge: Family;Available 24 hours/day Type of Home: House Home Access: Level entry (with small threshold)     Home Layout: One level     Bathroom Shower/Tub: Producer, television/film/video: Standard     Home Equipment: Wheelchair - manual (used to be her husbands); Sports administrator          Prior Functioning/Environment Prior Level of Function : Independent/Modified Independent                        OT Problem List: Decreased range of motion;Decreased strength;Decreased activity tolerance;Impaired balance (sitting and/or standing);Pain;Decreased knowledge of use of DME or AE;Decreased  knowledge of precautions         OT Goals(Current goals can be found in the care plan section) Acute Rehab OT Goals Patient Stated Goal: to go home today      Co-evaluation PT/OT/SLP Co-Evaluation/Treatment: Yes (partial) Reason for Co-Treatment: For patient/therapist safety;To address functional/ADL transfers PT goals addressed during session: Strengthening/ROM;Proper use of DME;Balance;Mobility/safety with mobility OT goals addressed during session: ADL's and self-care;Strengthening/ROM      AM-PAC OT "6 Clicks" Daily Activity     Outcome Measure Help from another person eating meals?: None Help from another person taking care of personal grooming?: A Little Help from another person toileting, which includes using toliet, bedpan, or urinal?: A Lot Help from another person bathing (including washing, rinsing, drying)?: A Little Help from another person to put on and taking off regular upper body clothing?: A Little Help from another person to put on and taking off regular lower body clothing?: A Lot 6 Click Score: 17   End of Session Equipment Utilized During Treatment: Gait belt;Rolling walker (2 wheels) (issued pt/family a gait belt (fit to pateint)) Nurse Communication:  (pt ready to go from therapy standpoint--via secure chat)  Activity Tolerance: Patient tolerated treatment well Patient left: in chair;with call bell/phone within reach;with family/visitor present (with PT in room with her completing further education)  OT Visit  Diagnosis: Unsteadiness on feet (R26.81);Other abnormalities of gait and mobility (R26.89);Muscle weakness (generalized) (M62.81);Pain Pain - Right/Left: Right Pain - part of body: Hip                Time: 9147-8295 OT Time Calculation (min): 68 min Charges:  OT General Charges $OT Visit: 1 Visit OT Evaluation $OT Eval Moderate Complexity: 1 Mod OT Treatments $Self Care/Home Management : 23-37 mins  Lindon Romp OT Acute Rehabilitation  Services Office [phone]    Deshondra, Ansell 06/04/2023, 11:45 AM

## 2023-06-04 NOTE — Discharge Summary (Signed)
Patient ID: [name] MRN: [id number] DOB/AGE: [birth year] 89 y.o.  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 Nile Riggs   Complication of anesthesia    Diverticulosis    per 2010 colonoscopy   Dyslipidemia    Full dentures    GERD (gastroesophageal reflux disease)    hx/o, resolved   Glaucoma 2016   H/O echocardiogram 06/22/2010   normal LVEF, moderate mitral and tricuspid regurg 07/2016 Dr. Rennis Golden;  mild aortic valve stenosis, left ventricular normal, EF 55%; Dr. Allyson Sabal  06/2010   History of cardiovascular stress test 06/22/2010   normal bruce myocadial perfusion study, 72% EF; Dr. Allyson Sabal   History of mammogram    benign bilat calcifications, heterogeneously dense breasts, stable mammograms 2009-2014   History of pneumonia    walking pneumonia "years ago"   History of uterine cancer 12/2002   s/p TAH and BSO, pelvic and periarotic lymphadenectomy 12/2002, no adjuvant therapy; stage 1b carcinosarcoma of uterus, completed 5 years of f/u as of 2008; Dr. De Blanch gyencology [facility]; Dr. 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 Alanda Amass   Osteoporosis    PONV (postoperative nausea and vomiting)    SVT (supraventricular tachycardia)    asymptomatic 2014, hx/o SVT, Dr. Alanda Amass   Vitamin D deficiency     Surgeries: Procedure(s): RIGHT TOTAL HIP ARTHROPLASTY REVISION, POSTERIOR on 06/03/2023   Consultants:    Discharged Condition: Improved  Hospital Course: [name] is an 89 y.o. female who was admitted 06/03/2023 for operative treatment ofFailure of recalled hardware of right total hip arthroplasty (HCC). Patient has severe unremitting pain that affects sleep, daily activities, and work/hobbies. After pre-op clearance the patient was taken to the operating room on 06/03/2023 and underwent  Procedure(s): RIGHT TOTAL HIP ARTHROPLASTY REVISION, POSTERIOR.    Patient was given perioperative antibiotics:  Anti-infectives (From admission, onward)    Start     Dose/Rate Route Frequency Ordered Stop   06/03/23 2000  ceFAZolin (ANCEF) IVPB 2g/100 mL premix        2 g 200 mL/hr over 30 Minutes Intravenous Every 6 hours 06/03/23 1717 06/04/23 0244   06/03/23 1318  vancomycin (VANCOCIN) powder  Status:  Discontinued          As needed 06/03/23 1319 06/03/23 1520   06/03/23 1032  ceFAZolin (ANCEF) 2-4 GM/100ML-% IVPB       Note to Pharmacy: Isabel Caprice, Destiny: cabinet override      06/03/23 1032 06/03/23 1248   06/03/23 1030  ceFAZolin (ANCEF) IVPB 2g/100 mL premix        2 g 200 mL/hr over 30 Minutes Intravenous On call to O.R. 06/03/23 1028 06/03/23 1303        Patient was given sequential compression devices, early ambulation, and chemoprophylaxis to prevent DVT.  Patient benefited maximally from hospital stay and there were no complications.  Recent vital signs: Patient Vitals for the past 24 hrs:  BP Temp Temp src Pulse Resp SpO2 Height  06/04/23 0927 125/71 98 F (36.7 C) Oral 88 17 99 % 5\' 1"  (1.549 m)  06/04/23 0428 134/65 99.1 F (37.3 C) Oral 76 18 100 % --  06/04/23 0015 137/64 98.4 F (36.9 C) Oral 62 19 100 % --  06/03/23 2005 (!) 161/70 98 F (36.7 C) Oral (!) 56 20 100 % --  06/03/23 1729 (!) 171/80 (!) 97.5 F (36.4 C) Oral 86 16 100 % --  06/03/23 1718 -- -- -- -- -- 99 % --  06/03/23 1645 (!) 144/61 97.6 F (36.4 C) -- (!) 51 15 99 % --  06/03/23 1630 137/63 --  -- (!) 57 15 99 % --  06/03/23 1615 130/64 -- -- (!) 55 13 99 % --  06/03/23 1600 125/67 -- -- (!) 52 14 100 % --  06/03/23 1545 112/72 -- -- (!) 52 13 97 % --  06/03/23 1530 (!) 102/52 97.6 F (36.4 C) -- (!) 50 20 98 % --     Recent laboratory studies:  Recent Labs    06/04/23 0028  WBC 9.3  HGB 10.1*  HCT 30.8*  PLT 156     Discharge Medications:   Allergies as of 06/04/2023       Reactions   Other Nausea Only   "medicines with steroids in it make me nauseous"   Prednisone Nausea And Vomiting        Medication List     STOP taking these medications    acetaminophen 500 MG tablet Commonly known as: TYLENOL   aspirin EC 81 MG tablet       TAKE these medications    atenolol 50 MG tablet Commonly known as: TENORMIN TAKE 1 TABLET BY MOUTH EVERY DAY What changed: when to take this   brimonidine 0.1 % Soln Commonly known as: ALPHAGAN P Place 1 drop into both eyes in the morning and at bedtime.   docusate sodium 100 MG capsule Commonly known as: Colace Take 1 capsule (100 mg total) by mouth daily as needed.   HYDROcodone-acetaminophen 5-325 MG tablet Commonly known as: Norco Take 1-2 tablets by mouth 3 (three) times daily as needed. To be taken after surgery   latanoprost 0.005 % ophthalmic solution Commonly known as: XALATAN Place 1 drop into both eyes at bedtime.   levocetirizine 5 MG tablet Commonly known as: XYZAL Take 5 mg by mouth every evening.   lisinopril 40 MG tablet Commonly known as: ZESTRIL Take 1 tablet (40 mg total) by mouth daily.   methocarbamol 500 MG tablet Commonly known as: ROBAXIN Take 1 tablet (500 mg total) by mouth 2 (two) times daily as needed.   ondansetron 4 MG tablet Commonly known as: Zofran Take 1 tablet (4 mg total) by mouth every 8 (eight) hours as needed for nausea or vomiting.   OVER THE COUNTER MEDICATION Take 1,000 mg by mouth in the morning. [place] Root   rivaroxaban 10 MG Tabs  tablet Commonly known as: XARELTO Take 1 tablet (10 mg total) by mouth daily. To be taken after surgery to prevent blood clots   simvastatin 40 MG tablet Commonly known as: ZOCOR TAKE 1 TABLET BY MOUTH EVERYDAY AT BEDTIME   VITAMIN D3 PO Take 1,000 Units by mouth in the morning.               Durable Medical Equipment  (From admission,  onward)           Start     Ordered   06/03/23 1718  DME Walker rolling  Once       Question:  Patient needs a walker to treat with the following condition  Answer:  History of hip replacement   06/03/23 1717   06/03/23 1718  DME 3 n 1  Once        06/03/23 1717   06/03/23 1718  DME Bedside commode  Once       Question:  Patient needs a bedside commode to treat with the following condition  Answer:  History of hip replacement   06/03/23 1717            Diagnostic Studies: DG Pelvis Portable  Result Date: 06/03/2023 CLINICAL DATA:  Status post revision right hip arthroplasty. EXAM: PORTABLE PELVIS 1-2 VIEWS COMPARISON:  Preoperative CT 02/18/2023 FINDINGS: Revision right hip arthroplasty in expected alignment. [place] projecting medial to the lesser trochanters likely related to the hamstring insertions when compared with prior CT. Previous left hip arthroplasty. IMPRESSION: Revision right hip arthroplasty in expected alignment. Electronically Signed   By: Narda Rutherford M.D.   On: 06/03/2023 17:39   DG HIP UNILAT WITH PELVIS 1V RIGHT  Result Date: 06/03/2023 CLINICAL DATA:  Right total hip arthroplasty revision. EXAM: DG HIP (WITH OR WITHOUT PELVIS) 1V RIGHT COMPARISON:  CT 02/18/2023 FINDINGS: Single view of the hip obtained in the operating room. Image obtained during hip surgery. And acetabular and femoral component are visualized. Fluoroscopy time 9.5 seconds, dose 1.73 mGy. IMPRESSION: Procedural fluoroscopy for hip surgery. Electronically Signed   By: Narda Rutherford M.D.   On: 06/03/2023 17:37   DG C-Arm 1-60 Min-No  Report  Result Date: 06/03/2023 Fluoroscopy was utilized by the requesting physician.  No radiographic interpretation.   DG C-Arm 1-60 Min-No Report  Result Date: 06/03/2023 Fluoroscopy was utilized by the requesting physician.  No radiographic interpretation.    Disposition: Discharge disposition: 01-Home or Self Care          Follow-up Information     Cristie Hem, PA-C. Schedule an appointment as soon as possible for a visit in 2 week(s).   Specialty: Orthopedic Surgery Contact information: [address] [place] [address] [phone]         [facility] Follow up.   Specialty: Home Health Services Why: [phone] Contact information: [address] [place] [address] [phone]                  Signed: Cristie Hem 06/04/2023, 1:47 PM

## 2023-06-04 NOTE — Evaluation (Addendum)
Physical Therapy Evaluation and Discharge Patient Details Name: [name] MRN: [id number] DOB: 06-Sep-1933 Today's Date: 06/04/2023  History of Present Illness  [name] is a 89 y.o. female who presents for surgical treatment of failed right total hip arthroplasty; s/p revision of right total hip replacement including acetabular cup, polyethylene liner and femoral head  Clinical Impression   Patient evaluated by Physical Therapy with no further acute PT needs identified, as plan is to dc today to home with family assist;  All education has been completed and the patient has no further questions.  See below for any follow-up Physical Therapy or equipment needs. PT is signing off. Thank you for this referral.        Recommendations for follow up therapy are one component of a multi-disciplinary discharge planning process, led by the attending physician.  Recommendations may be updated based on patient status, additional functional criteria and insurance authorization.  Follow Up Recommendations       Assistance Recommended at Discharge Intermittent Supervision/Assistance  Patient can return home with the following   PRN assist    Equipment Recommendations BSC/3in1;Rolling walker (2 wheels) (Youth-sized RW)  Recommendations for Other Services       Functional Status Assessment Patient has had a recent decline in their functional status and demonstrates the ability to make significant improvements in function in a reasonable and predictable amount of time.     Precautions / Restrictions Precautions Precautions: Posterior Hip Precaution Booklet Issued: Yes (comment) Required Braces or Orthoses: Knee Immobilizer - Right (to wear while in bed) Knee Immobilizer - Right:  (while inpt) Restrictions Weight Bearing Restrictions: Yes RLE Weight Bearing: Touchdown weight bearing      Mobility  Bed Mobility Overal bed mobility: Needs Assistance Bed Mobility: Supine to  Sit           General bed mobility comments: Up with OT    Transfers Overall transfer level: Needs assistance Equipment used: Rolling walker (2 wheels) Transfers: Sit to/from Stand, Bed to chair/wheelchair/BSC Sit to Stand: Min assist, Min guard   Step pivot transfers: Min assist, Min guard       General transfer comment: Cues for positioning for post precautions, and for hand placement; Min/minguard assist for safety and to steady RW; tends to put more than TWB RLE, so cued to "not touch teh floor"; able to take pivot steps with simultaneous demonstration; with solely VCs, tends to take small hop steps; performed sit<>stand at least 3x, and stand/step pivot transfer with RW x2-3x; difficulty with moving hands form armrest to RW    Ambulation/Gait               General Gait Details: Feferred due to difficulty keeping TWB  Stairs            Wheelchair Mobility    Modified Rankin (Stroke Patients Only)       Balance                                             Pertinent Vitals/Pain Pain Assessment Pain Assessment: Faces Faces Pain Scale: Hurts a little bit Pain Location: Occasional grimace with mobility Pain Descriptors / Indicators: Grimacing Pain Intervention(s): Monitored during session    Home Living Family/patient expects to be discharged to:: Private residence Living Arrangements: Spouse/significant other Available Help at Discharge: Family;Available 24 hours/day Type of Home: House  Home Access: Level entry (with small threshold)       Home Layout: One level Home Equipment: Wheelchair - manual (used to be her husbands)      Prior Function Prior Level of Function : Independent/Modified Independent                     Hand Dominance   Dominant Hand: Right    Extremity/Trunk Assessment   Upper Extremity Assessment Upper Extremity Assessment: Overall WFL for tasks assessed    Lower Extremity  Assessment Lower Extremity Assessment: RLE deficits/detail RLE Deficits / Details: Overall mvoing RLE without too much discomfort; Difficulty maintaining TWB when her foot makes contact with the floor       Communication   Communication: No difficulties  Cognition Arousal/Alertness: Awake/alert Behavior During Therapy: WFL for tasks assessed/performed Overall Cognitive Status: Within Functional Limits for tasks assessed                                          General Comments General comments (skin integrity, edema, etc.): Discussed using Ice Man device to ice hip; Discussed car transfers and used transport chair (that you sit down into sideways) to practice technqiue for getting in and out of the car; family present, supportive, and asking very good questions    Exercises     Assessment/Plan    PT Assessment All further PT needs can be met in the next venue of care  PT Problem List Decreased strength;Decreased activity tolerance;Decreased balance;Decreased mobility;Decreased knowledge of use of DME;Decreased safety awareness;Decreased knowledge of precautions       PT Treatment Interventions      PT Goals (Current goals can be found in the Care Plan section)  Acute Rehab PT Goals Patient Stated Goal: home today PT Goal Formulation: All assessment and education complete, DC therapy    Frequency       Co-evaluation PT/OT/SLP Co-Evaluation/Treatment: Yes Reason for Co-Treatment: For patient/therapist safety;To address functional/ADL transfers PT goals addressed during session: Strengthening/ROM;Proper use of DME;Balance;Mobility/safety with mobility OT goals addressed during session: ADL's and self-care;Strengthening/ROM       AM-PAC PT "6 Clicks" Mobility  Outcome Measure Help needed turning from your back to your side while in a flat bed without using bedrails?: None Help needed moving from lying on your back to sitting on the side of a flat bed  without using bedrails?: None Help needed moving to and from a bed to a chair (including a wheelchair)?: A Little Help needed standing up from a chair using your arms (e.g., wheelchair or bedside chair)?: A Little Help needed to walk in hospital room?: A Lot Help needed climbing 3-5 steps with a railing? : Total 6 Click Score: 17    End of Session Equipment Utilized During Treatment: Gait belt Activity Tolerance: Patient tolerated treatment well Patient left: in chair;with call bell/phone within reach;with family/visitor present Nurse Communication: Mobility status PT Visit Diagnosis: Other abnormalities of gait and mobility (R26.89);Difficulty in walking, not elsewhere classified (R26.2)    Time: 1005-1126 (minus approx 15 minutes getting trasnport chair) PT Time Calculation (min) (ACUTE ONLY): 81 min   Charges:   PT Evaluation $PT Eval Low Complexity: 1 Low PT Treatments $Therapeutic Activity: 8-22 mins        Van Clines, PT  Acute Rehabilitation Services Office [phone] Secure Chat welcomed   Levi Aland 06/04/2023, 11:53 AM

## 2023-06-05 ENCOUNTER — Telehealth: Payer: Self-pay | Admitting: Orthopaedic Surgery

## 2023-06-05 NOTE — Telephone Encounter (Signed)
I called and talked to the daughter-in-law. She was very frustrated due to the lack of follow-up care after her mother-in-laws surgery. Said no one has reached out about PT, She also wants OT set up for mom as well, Said Dr. Roda Shutters never saw her mother-in-law after surgery. She just felt like she was just discharged with no instructions. She said her mom was not informed not to take aspirin with her xarelto(I confirmed for her not to take any nsaids on xarelto) She asked if she can take tylenol instead of norco. I advised it was ok.   Cordelia Pen can you help me follow-up on therapy. She wants them to contact her asap

## 2023-06-05 NOTE — Telephone Encounter (Signed)
Thank you Sherri

## 2023-06-05 NOTE — Telephone Encounter (Signed)
Spoke w/Cheryl [phone], the patient's daughter-in-law, she stated the patient had surgery on 6/17 and was discharged 6/18.  She has a few questions and would like to speak to a nurse.

## 2023-06-06 ENCOUNTER — Telehealth: Payer: Self-pay | Admitting: *Deleted

## 2023-06-06 DIAGNOSIS — I10 Essential (primary) hypertension: Secondary | ICD-10-CM | POA: Diagnosis not present

## 2023-06-06 DIAGNOSIS — E559 Vitamin D deficiency, unspecified: Secondary | ICD-10-CM | POA: Diagnosis not present

## 2023-06-06 DIAGNOSIS — M1990 Unspecified osteoarthritis, unspecified site: Secondary | ICD-10-CM | POA: Diagnosis not present

## 2023-06-06 DIAGNOSIS — M810 Age-related osteoporosis without current pathological fracture: Secondary | ICD-10-CM | POA: Diagnosis not present

## 2023-06-06 DIAGNOSIS — F419 Anxiety disorder, unspecified: Secondary | ICD-10-CM | POA: Diagnosis not present

## 2023-06-06 DIAGNOSIS — T84010D Broken internal right hip prosthesis, subsequent encounter: Secondary | ICD-10-CM | POA: Diagnosis not present

## 2023-06-06 DIAGNOSIS — E785 Hyperlipidemia, unspecified: Secondary | ICD-10-CM | POA: Diagnosis not present

## 2023-06-06 DIAGNOSIS — I456 Pre-excitation syndrome: Secondary | ICD-10-CM | POA: Diagnosis not present

## 2023-06-06 DIAGNOSIS — Z96641 Presence of right artificial hip joint: Secondary | ICD-10-CM | POA: Diagnosis not present

## 2023-06-06 NOTE — Telephone Encounter (Signed)
Spoke with patient and family today and confirmed posterior hip precautions as well as TDWB for approximately 6 weeks per Dr. Roda Shutters. OK to wear compression stocking on surgical leg as OR placed on non-surgical while at hospital. OK to get a set of compression stocking and wear on both legs from either [employer] or [employer]. Answered all other questions related to post op care. Dr. Roda Shutters aware. Spoke with [name] from [facility], who was in home doing eval today and confirmed orders for HHPT.

## 2023-06-10 ENCOUNTER — Telehealth: Payer: Self-pay | Admitting: Orthopaedic Surgery

## 2023-06-10 DIAGNOSIS — E559 Vitamin D deficiency, unspecified: Secondary | ICD-10-CM | POA: Diagnosis not present

## 2023-06-10 DIAGNOSIS — T84010D Broken internal right hip prosthesis, subsequent encounter: Secondary | ICD-10-CM | POA: Diagnosis not present

## 2023-06-10 DIAGNOSIS — F419 Anxiety disorder, unspecified: Secondary | ICD-10-CM | POA: Diagnosis not present

## 2023-06-10 DIAGNOSIS — I10 Essential (primary) hypertension: Secondary | ICD-10-CM | POA: Diagnosis not present

## 2023-06-10 DIAGNOSIS — E785 Hyperlipidemia, unspecified: Secondary | ICD-10-CM | POA: Diagnosis not present

## 2023-06-10 DIAGNOSIS — M1990 Unspecified osteoarthritis, unspecified site: Secondary | ICD-10-CM | POA: Diagnosis not present

## 2023-06-10 DIAGNOSIS — I456 Pre-excitation syndrome: Secondary | ICD-10-CM | POA: Diagnosis not present

## 2023-06-10 DIAGNOSIS — Z96641 Presence of right artificial hip joint: Secondary | ICD-10-CM | POA: Diagnosis not present

## 2023-06-10 DIAGNOSIS — M810 Age-related osteoporosis without current pathological fracture: Secondary | ICD-10-CM | POA: Diagnosis not present

## 2023-06-10 NOTE — Telephone Encounter (Signed)
Called and gave verbal

## 2023-06-10 NOTE — Telephone Encounter (Signed)
Will from [facility] canned requesting \\verbal  orders for OT 1 week 2, 2 week 1 and 1 week 1 CB [phone], okay to leave VM

## 2023-06-11 DIAGNOSIS — E785 Hyperlipidemia, unspecified: Secondary | ICD-10-CM | POA: Diagnosis not present

## 2023-06-11 DIAGNOSIS — Z96641 Presence of right artificial hip joint: Secondary | ICD-10-CM | POA: Diagnosis not present

## 2023-06-11 DIAGNOSIS — I456 Pre-excitation syndrome: Secondary | ICD-10-CM | POA: Diagnosis not present

## 2023-06-11 DIAGNOSIS — M810 Age-related osteoporosis without current pathological fracture: Secondary | ICD-10-CM | POA: Diagnosis not present

## 2023-06-11 DIAGNOSIS — F419 Anxiety disorder, unspecified: Secondary | ICD-10-CM | POA: Diagnosis not present

## 2023-06-11 DIAGNOSIS — M1990 Unspecified osteoarthritis, unspecified site: Secondary | ICD-10-CM | POA: Diagnosis not present

## 2023-06-11 DIAGNOSIS — T84010D Broken internal right hip prosthesis, subsequent encounter: Secondary | ICD-10-CM | POA: Diagnosis not present

## 2023-06-11 DIAGNOSIS — E559 Vitamin D deficiency, unspecified: Secondary | ICD-10-CM | POA: Diagnosis not present

## 2023-06-11 DIAGNOSIS — I10 Essential (primary) hypertension: Secondary | ICD-10-CM | POA: Diagnosis not present

## 2023-06-12 ENCOUNTER — Telehealth: Payer: Self-pay | Admitting: *Deleted

## 2023-06-12 NOTE — Telephone Encounter (Signed)
Call received from [place], patent's DIL- who is with patient. She had several questions related to after care of her mother in law's hip revision surgery. Discussed compression hose and tightness. They got some from [place]. Suggested 15-20 mm Hg, but states unsure of pressure due to product having different rating. Discussed possibly increasing size. Talked about incentive spirometer and use along with elevation and activity. Post op on 06/18/23. DIL appreciative of information.

## 2023-06-13 DIAGNOSIS — M1990 Unspecified osteoarthritis, unspecified site: Secondary | ICD-10-CM | POA: Diagnosis not present

## 2023-06-13 DIAGNOSIS — Z96641 Presence of right artificial hip joint: Secondary | ICD-10-CM | POA: Diagnosis not present

## 2023-06-13 DIAGNOSIS — E785 Hyperlipidemia, unspecified: Secondary | ICD-10-CM | POA: Diagnosis not present

## 2023-06-13 DIAGNOSIS — I456 Pre-excitation syndrome: Secondary | ICD-10-CM | POA: Diagnosis not present

## 2023-06-13 DIAGNOSIS — E559 Vitamin D deficiency, unspecified: Secondary | ICD-10-CM | POA: Diagnosis not present

## 2023-06-13 DIAGNOSIS — M810 Age-related osteoporosis without current pathological fracture: Secondary | ICD-10-CM | POA: Diagnosis not present

## 2023-06-13 DIAGNOSIS — F419 Anxiety disorder, unspecified: Secondary | ICD-10-CM | POA: Diagnosis not present

## 2023-06-13 DIAGNOSIS — T84010D Broken internal right hip prosthesis, subsequent encounter: Secondary | ICD-10-CM | POA: Diagnosis not present

## 2023-06-13 DIAGNOSIS — I10 Essential (primary) hypertension: Secondary | ICD-10-CM | POA: Diagnosis not present

## 2023-06-17 NOTE — Progress Notes (Unsigned)
Post-Op Visit Note   Patient: [name]           Date of Birth: February 23, 1933           MRN: [id number] Visit Date: 06/18/2023 PCP: Ollen Bowl, MD   Assessment & Plan:  Chief Complaint: No chief complaint on file.  Visit Diagnoses:  1. 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   Orders:  No orders of the defined types were placed in this encounter.  No orders of the defined types were placed in this encounter.   Imaging: No results found.  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   Tonsil asymmetry 11/03/2019   Abnormal chest x-ray 08/12/2019   Encounter for health maintenance examination in adult 08/12/2019   Need for influenza vaccination 08/12/2019   Chest wall pain 06/11/2019   Muscle strain 06/11/2019   Abnormal EKG 06/11/2019   Murmur 09/05/2018   Aortic atherosclerosis (HCC) 08/04/2018   Elevated ferritin 08/04/2018   Other fatigue 07/14/2018   Iron disorder 07/14/2018   Primary open angle glaucoma of both eyes, mild stage 10/14/2017   Glaucoma 07/11/2017   Vaccine counseling 07/11/2017   Varicose vein of leg 07/11/2017   Need for shingles vaccine 07/11/2017   Hearing loss 06/27/2016   Medicare annual wellness visit, subsequent 06/26/2016   Osteoporosis 01/12/2016   Dyslipidemia 06/14/2015   Essential hypertension 06/14/2015   Mitral valve regurgitation 06/14/2015   Tricuspid valve insufficiency 06/14/2015   Vitamin D deficiency 06/14/2015   Rhinitis, allergic 06/14/2015   Renal insufficiency 06/14/2015   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 Nile Riggs   Complication of anesthesia    Diverticulosis    per 2010 colonoscopy   Dyslipidemia    Full dentures    GERD (gastroesophageal reflux disease)    hx/o, resolved   Glaucoma 2016   H/O echocardiogram 06/22/2010   normal LVEF, moderate mitral and tricuspid regurg 07/2016 Dr. Rennis Golden;  mild aortic valve stenosis, left ventricular normal, EF 55%; Dr. Allyson Sabal  06/2010   History of cardiovascular stress test 06/22/2010   normal bruce myocadial perfusion study, 72% EF; Dr. Allyson Sabal   History of mammogram    benign bilat calcifications, heterogeneously dense breasts, stable mammograms 2009-2014   History of pneumonia    walking pneumonia "years ago"   History of uterine cancer 12/2002   s/p TAH and BSO, pelvic and periarotic lymphadenectomy 12/2002, no adjuvant therapy; stage 1b carcinosarcoma of uterus, completed 5 years of f/u as of 2008; Dr. De Blanch gyencology oncology [place]; Dr. 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 Alanda Amass   Osteoporosis  PONV (postoperative nausea and vomiting)    SVT (supraventricular tachycardia)    asymptomatic 2014, hx/o SVT, Dr. Alanda Amass   Vitamin D deficiency     Family History  Family history unknown: Yes    Past Surgical History:  Procedure Laterality Date   ABDOMINAL HYSTERECTOMY Bilateral    h/o uterine cancer; oophrectomy   CATARACT EXTRACTION     bilat   COLONOSCOPY  08/2016   08/2016  Dr. Elnoria Howard advised no repeat colonoscopy;  scattered diverticla, medium hemorrhoids 2010; Dr. Elnoria Howard   JOINT REPLACEMENT     NM MYOCAR PERF WALL MOTION      THROAT SURGERY     some tissue removed [facility] throat at [facility]   TONSILLECTOMY Right 12/28/2019   Procedure: TONSILLECTOMY;  Surgeon: Christia Reading, MD;  Location:  SURGERY CENTER;  Service: ENT;  Laterality: Right;   TOTAL HIP ARTHROPLASTY Bilateral 2008, 2010    twice left (complication on the left, once on the right; Dr. Dorene Grebe   TOTAL HIP REVISION Right 06/03/2023   Procedure: RIGHT TOTAL HIP ARTHROPLASTY REVISION, POSTERIOR;  Surgeon: Tarry Kos, MD;  Location: [facility] OR;  Service: Orthopedics;  Laterality: Right;  3-C   TRANSTHORACIC ECHOCARDIOGRAM  02/2013   ordered for SVT; EF 55-65%; calcified MV annulus, mod MR; RSVP increased; RA mildly dilated; mod TR; PA peak pressure   Social History   Occupational History   Not on file  Tobacco Use   Smoking status: Never   Smokeless tobacco: Never  Vaping Use   Vaping Use: Never used  Substance and Sexual Activity   Alcohol use: No   Drug use: No   Sexual activity: Never    Birth control/protection: Surgical    Comment: walks 3x/wk, retired, married, 2 grandchildren

## 2023-06-18 ENCOUNTER — Other Ambulatory Visit (INDEPENDENT_AMBULATORY_CARE_PROVIDER_SITE_OTHER): Payer: Medicare PPO

## 2023-06-18 ENCOUNTER — Ambulatory Visit (INDEPENDENT_AMBULATORY_CARE_PROVIDER_SITE_OTHER): Payer: Medicare PPO | Admitting: Orthopaedic Surgery

## 2023-06-18 DIAGNOSIS — I10 Essential (primary) hypertension: Secondary | ICD-10-CM | POA: Diagnosis not present

## 2023-06-18 DIAGNOSIS — E785 Hyperlipidemia, unspecified: Secondary | ICD-10-CM | POA: Diagnosis not present

## 2023-06-18 DIAGNOSIS — T84010A Broken internal right hip prosthesis, initial encounter: Secondary | ICD-10-CM | POA: Diagnosis not present

## 2023-06-18 DIAGNOSIS — E559 Vitamin D deficiency, unspecified: Secondary | ICD-10-CM | POA: Diagnosis not present

## 2023-06-18 DIAGNOSIS — M810 Age-related osteoporosis without current pathological fracture: Secondary | ICD-10-CM | POA: Diagnosis not present

## 2023-06-18 DIAGNOSIS — F419 Anxiety disorder, unspecified: Secondary | ICD-10-CM | POA: Diagnosis not present

## 2023-06-18 DIAGNOSIS — I456 Pre-excitation syndrome: Secondary | ICD-10-CM | POA: Diagnosis not present

## 2023-06-18 DIAGNOSIS — Z96641 Presence of right artificial hip joint: Secondary | ICD-10-CM | POA: Diagnosis not present

## 2023-06-18 DIAGNOSIS — M1990 Unspecified osteoarthritis, unspecified site: Secondary | ICD-10-CM | POA: Diagnosis not present

## 2023-06-18 DIAGNOSIS — T84010D Broken internal right hip prosthesis, subsequent encounter: Secondary | ICD-10-CM | POA: Diagnosis not present

## 2023-06-19 DIAGNOSIS — E559 Vitamin D deficiency, unspecified: Secondary | ICD-10-CM | POA: Diagnosis not present

## 2023-06-19 DIAGNOSIS — Z96641 Presence of right artificial hip joint: Secondary | ICD-10-CM | POA: Diagnosis not present

## 2023-06-19 DIAGNOSIS — I456 Pre-excitation syndrome: Secondary | ICD-10-CM | POA: Diagnosis not present

## 2023-06-19 DIAGNOSIS — M810 Age-related osteoporosis without current pathological fracture: Secondary | ICD-10-CM | POA: Diagnosis not present

## 2023-06-19 DIAGNOSIS — I10 Essential (primary) hypertension: Secondary | ICD-10-CM | POA: Diagnosis not present

## 2023-06-19 DIAGNOSIS — M1990 Unspecified osteoarthritis, unspecified site: Secondary | ICD-10-CM | POA: Diagnosis not present

## 2023-06-19 DIAGNOSIS — E785 Hyperlipidemia, unspecified: Secondary | ICD-10-CM | POA: Diagnosis not present

## 2023-06-19 DIAGNOSIS — F419 Anxiety disorder, unspecified: Secondary | ICD-10-CM | POA: Diagnosis not present

## 2023-06-19 DIAGNOSIS — T84010D Broken internal right hip prosthesis, subsequent encounter: Secondary | ICD-10-CM | POA: Diagnosis not present

## 2023-06-24 DIAGNOSIS — I10 Essential (primary) hypertension: Secondary | ICD-10-CM | POA: Diagnosis not present

## 2023-06-24 DIAGNOSIS — F419 Anxiety disorder, unspecified: Secondary | ICD-10-CM | POA: Diagnosis not present

## 2023-06-24 DIAGNOSIS — E785 Hyperlipidemia, unspecified: Secondary | ICD-10-CM | POA: Diagnosis not present

## 2023-06-24 DIAGNOSIS — I456 Pre-excitation syndrome: Secondary | ICD-10-CM | POA: Diagnosis not present

## 2023-06-24 DIAGNOSIS — M1990 Unspecified osteoarthritis, unspecified site: Secondary | ICD-10-CM | POA: Diagnosis not present

## 2023-06-24 DIAGNOSIS — T84010D Broken internal right hip prosthesis, subsequent encounter: Secondary | ICD-10-CM | POA: Diagnosis not present

## 2023-06-24 DIAGNOSIS — Z96641 Presence of right artificial hip joint: Secondary | ICD-10-CM | POA: Diagnosis not present

## 2023-06-24 DIAGNOSIS — M810 Age-related osteoporosis without current pathological fracture: Secondary | ICD-10-CM | POA: Diagnosis not present

## 2023-06-24 DIAGNOSIS — E559 Vitamin D deficiency, unspecified: Secondary | ICD-10-CM | POA: Diagnosis not present

## 2023-06-25 DIAGNOSIS — T84010D Broken internal right hip prosthesis, subsequent encounter: Secondary | ICD-10-CM | POA: Diagnosis not present

## 2023-06-25 DIAGNOSIS — E785 Hyperlipidemia, unspecified: Secondary | ICD-10-CM | POA: Diagnosis not present

## 2023-06-25 DIAGNOSIS — M810 Age-related osteoporosis without current pathological fracture: Secondary | ICD-10-CM | POA: Diagnosis not present

## 2023-06-25 DIAGNOSIS — I10 Essential (primary) hypertension: Secondary | ICD-10-CM | POA: Diagnosis not present

## 2023-06-25 DIAGNOSIS — Z96641 Presence of right artificial hip joint: Secondary | ICD-10-CM | POA: Diagnosis not present

## 2023-06-25 DIAGNOSIS — I456 Pre-excitation syndrome: Secondary | ICD-10-CM | POA: Diagnosis not present

## 2023-06-25 DIAGNOSIS — F419 Anxiety disorder, unspecified: Secondary | ICD-10-CM | POA: Diagnosis not present

## 2023-06-25 DIAGNOSIS — E559 Vitamin D deficiency, unspecified: Secondary | ICD-10-CM | POA: Diagnosis not present

## 2023-06-25 DIAGNOSIS — M1990 Unspecified osteoarthritis, unspecified site: Secondary | ICD-10-CM | POA: Diagnosis not present

## 2023-06-26 DIAGNOSIS — M1990 Unspecified osteoarthritis, unspecified site: Secondary | ICD-10-CM | POA: Diagnosis not present

## 2023-06-26 DIAGNOSIS — E785 Hyperlipidemia, unspecified: Secondary | ICD-10-CM | POA: Diagnosis not present

## 2023-06-26 DIAGNOSIS — I456 Pre-excitation syndrome: Secondary | ICD-10-CM | POA: Diagnosis not present

## 2023-06-26 DIAGNOSIS — F419 Anxiety disorder, unspecified: Secondary | ICD-10-CM | POA: Diagnosis not present

## 2023-06-26 DIAGNOSIS — E559 Vitamin D deficiency, unspecified: Secondary | ICD-10-CM | POA: Diagnosis not present

## 2023-06-26 DIAGNOSIS — T84010D Broken internal right hip prosthesis, subsequent encounter: Secondary | ICD-10-CM | POA: Diagnosis not present

## 2023-06-26 DIAGNOSIS — I10 Essential (primary) hypertension: Secondary | ICD-10-CM | POA: Diagnosis not present

## 2023-06-26 DIAGNOSIS — Z96641 Presence of right artificial hip joint: Secondary | ICD-10-CM | POA: Diagnosis not present

## 2023-06-26 DIAGNOSIS — M810 Age-related osteoporosis without current pathological fracture: Secondary | ICD-10-CM | POA: Diagnosis not present

## 2023-06-27 DIAGNOSIS — M810 Age-related osteoporosis without current pathological fracture: Secondary | ICD-10-CM | POA: Diagnosis not present

## 2023-06-27 DIAGNOSIS — E559 Vitamin D deficiency, unspecified: Secondary | ICD-10-CM | POA: Diagnosis not present

## 2023-06-27 DIAGNOSIS — T84010D Broken internal right hip prosthesis, subsequent encounter: Secondary | ICD-10-CM | POA: Diagnosis not present

## 2023-06-27 DIAGNOSIS — I456 Pre-excitation syndrome: Secondary | ICD-10-CM | POA: Diagnosis not present

## 2023-06-27 DIAGNOSIS — E785 Hyperlipidemia, unspecified: Secondary | ICD-10-CM | POA: Diagnosis not present

## 2023-06-27 DIAGNOSIS — Z96641 Presence of right artificial hip joint: Secondary | ICD-10-CM | POA: Diagnosis not present

## 2023-06-27 DIAGNOSIS — M1990 Unspecified osteoarthritis, unspecified site: Secondary | ICD-10-CM | POA: Diagnosis not present

## 2023-06-27 DIAGNOSIS — F419 Anxiety disorder, unspecified: Secondary | ICD-10-CM | POA: Diagnosis not present

## 2023-06-27 DIAGNOSIS — I10 Essential (primary) hypertension: Secondary | ICD-10-CM | POA: Diagnosis not present

## 2023-07-01 DIAGNOSIS — E559 Vitamin D deficiency, unspecified: Secondary | ICD-10-CM | POA: Diagnosis not present

## 2023-07-01 DIAGNOSIS — T84010D Broken internal right hip prosthesis, subsequent encounter: Secondary | ICD-10-CM | POA: Diagnosis not present

## 2023-07-01 DIAGNOSIS — E785 Hyperlipidemia, unspecified: Secondary | ICD-10-CM | POA: Diagnosis not present

## 2023-07-01 DIAGNOSIS — M810 Age-related osteoporosis without current pathological fracture: Secondary | ICD-10-CM | POA: Diagnosis not present

## 2023-07-01 DIAGNOSIS — I10 Essential (primary) hypertension: Secondary | ICD-10-CM | POA: Diagnosis not present

## 2023-07-01 DIAGNOSIS — Z96641 Presence of right artificial hip joint: Secondary | ICD-10-CM | POA: Diagnosis not present

## 2023-07-01 DIAGNOSIS — M1990 Unspecified osteoarthritis, unspecified site: Secondary | ICD-10-CM | POA: Diagnosis not present

## 2023-07-01 DIAGNOSIS — I456 Pre-excitation syndrome: Secondary | ICD-10-CM | POA: Diagnosis not present

## 2023-07-01 DIAGNOSIS — F419 Anxiety disorder, unspecified: Secondary | ICD-10-CM | POA: Diagnosis not present

## 2023-07-02 DIAGNOSIS — M810 Age-related osteoporosis without current pathological fracture: Secondary | ICD-10-CM | POA: Diagnosis not present

## 2023-07-02 DIAGNOSIS — Z96641 Presence of right artificial hip joint: Secondary | ICD-10-CM | POA: Diagnosis not present

## 2023-07-02 DIAGNOSIS — T84010D Broken internal right hip prosthesis, subsequent encounter: Secondary | ICD-10-CM | POA: Diagnosis not present

## 2023-07-02 DIAGNOSIS — I456 Pre-excitation syndrome: Secondary | ICD-10-CM | POA: Diagnosis not present

## 2023-07-02 DIAGNOSIS — F419 Anxiety disorder, unspecified: Secondary | ICD-10-CM | POA: Diagnosis not present

## 2023-07-02 DIAGNOSIS — E785 Hyperlipidemia, unspecified: Secondary | ICD-10-CM | POA: Diagnosis not present

## 2023-07-02 DIAGNOSIS — I10 Essential (primary) hypertension: Secondary | ICD-10-CM | POA: Diagnosis not present

## 2023-07-02 DIAGNOSIS — E559 Vitamin D deficiency, unspecified: Secondary | ICD-10-CM | POA: Diagnosis not present

## 2023-07-02 DIAGNOSIS — M1990 Unspecified osteoarthritis, unspecified site: Secondary | ICD-10-CM | POA: Diagnosis not present

## 2023-07-03 DIAGNOSIS — I10 Essential (primary) hypertension: Secondary | ICD-10-CM | POA: Diagnosis not present

## 2023-07-03 DIAGNOSIS — E785 Hyperlipidemia, unspecified: Secondary | ICD-10-CM | POA: Diagnosis not present

## 2023-07-03 DIAGNOSIS — F419 Anxiety disorder, unspecified: Secondary | ICD-10-CM | POA: Diagnosis not present

## 2023-07-03 DIAGNOSIS — M1990 Unspecified osteoarthritis, unspecified site: Secondary | ICD-10-CM | POA: Diagnosis not present

## 2023-07-03 DIAGNOSIS — T84010D Broken internal right hip prosthesis, subsequent encounter: Secondary | ICD-10-CM | POA: Diagnosis not present

## 2023-07-03 DIAGNOSIS — E559 Vitamin D deficiency, unspecified: Secondary | ICD-10-CM | POA: Diagnosis not present

## 2023-07-03 DIAGNOSIS — Z96641 Presence of right artificial hip joint: Secondary | ICD-10-CM | POA: Diagnosis not present

## 2023-07-03 DIAGNOSIS — I456 Pre-excitation syndrome: Secondary | ICD-10-CM | POA: Diagnosis not present

## 2023-07-03 DIAGNOSIS — M810 Age-related osteoporosis without current pathological fracture: Secondary | ICD-10-CM | POA: Diagnosis not present

## 2023-07-09 DIAGNOSIS — E785 Hyperlipidemia, unspecified: Secondary | ICD-10-CM | POA: Diagnosis not present

## 2023-07-09 DIAGNOSIS — Z96641 Presence of right artificial hip joint: Secondary | ICD-10-CM | POA: Diagnosis not present

## 2023-07-09 DIAGNOSIS — I10 Essential (primary) hypertension: Secondary | ICD-10-CM | POA: Diagnosis not present

## 2023-07-09 DIAGNOSIS — M1990 Unspecified osteoarthritis, unspecified site: Secondary | ICD-10-CM | POA: Diagnosis not present

## 2023-07-09 DIAGNOSIS — E559 Vitamin D deficiency, unspecified: Secondary | ICD-10-CM | POA: Diagnosis not present

## 2023-07-09 DIAGNOSIS — T84010D Broken internal right hip prosthesis, subsequent encounter: Secondary | ICD-10-CM | POA: Diagnosis not present

## 2023-07-09 DIAGNOSIS — M810 Age-related osteoporosis without current pathological fracture: Secondary | ICD-10-CM | POA: Diagnosis not present

## 2023-07-09 DIAGNOSIS — I456 Pre-excitation syndrome: Secondary | ICD-10-CM | POA: Diagnosis not present

## 2023-07-09 DIAGNOSIS — F419 Anxiety disorder, unspecified: Secondary | ICD-10-CM | POA: Diagnosis not present

## 2023-07-16 ENCOUNTER — Other Ambulatory Visit: Payer: Self-pay

## 2023-07-16 ENCOUNTER — Other Ambulatory Visit (INDEPENDENT_AMBULATORY_CARE_PROVIDER_SITE_OTHER): Payer: Medicare PPO

## 2023-07-16 ENCOUNTER — Ambulatory Visit: Payer: Medicare PPO | Admitting: Orthopaedic Surgery

## 2023-07-16 ENCOUNTER — Telehealth: Payer: Self-pay

## 2023-07-16 DIAGNOSIS — T84010A Broken internal right hip prosthesis, initial encounter: Secondary | ICD-10-CM

## 2023-07-16 NOTE — Telephone Encounter (Signed)
Order put in for HHPT  gait training hip strengthen

## 2023-07-16 NOTE — Progress Notes (Signed)
Post-Op Visit Note   Patient: [name]           Date of Birth: 10-Aug-1933           MRN: [id number] Visit Date: 07/16/2023 PCP: Ollen Bowl, MD   Assessment & Plan:  Chief Complaint:  Chief Complaint  Patient presents with   Right Hip - Routine Post Op   Visit Diagnoses:  1. 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  She is doing well with home health therapy.  She reports no pain.  Examination of the right hip shows fully healed surgical scar.  She has fluid painless range of motion of the hip.  The acetabular component looks stable on x-rays without any complications.  At this point we will advance her to weight-bear as tolerated to the right lower extremity.  New order has been placed for home health PT for gait training, hip strengthening.  Recheck in 6 weeks repeat x-rays of the right hip.  Follow-Up Instructions: Return in about 6 weeks (around 08/27/2023).   Orders:  Orders Placed This Encounter  Procedures   XR HIP UNILAT W OR W/O PELVIS 2-3 VIEWS RIGHT   No orders of the defined types were placed in this encounter.   Imaging: XR HIP UNILAT W OR W/O PELVIS 2-3 VIEWS RIGHT  Result Date: 07/16/2023 X-rays demonstrate stable total hip prosthesis status post acetabular revision.  There is no evidence of subsidence or loosening of the acetabular component.   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   Tonsil asymmetry 11/03/2019   Abnormal chest x-ray 08/12/2019   Encounter for health maintenance examination in adult 08/12/2019   Need for influenza vaccination 08/12/2019    Chest wall pain 06/11/2019   Muscle strain 06/11/2019   Abnormal EKG 06/11/2019   Murmur 09/05/2018   Aortic atherosclerosis (HCC) 08/04/2018   Elevated ferritin 08/04/2018   Other fatigue 07/14/2018   Iron disorder 07/14/2018   Primary open angle glaucoma of both eyes, mild stage 10/14/2017   Glaucoma 07/11/2017   Vaccine counseling 07/11/2017   Varicose vein of leg 07/11/2017   Need for shingles vaccine 07/11/2017   Hearing loss 06/27/2016   Medicare annual wellness visit, subsequent 06/26/2016   Osteoporosis 01/12/2016   Dyslipidemia 06/14/2015   Essential hypertension 06/14/2015   Mitral valve regurgitation 06/14/2015   Tricuspid valve insufficiency 06/14/2015   Vitamin D deficiency 06/14/2015   Rhinitis, allergic 06/14/2015   Renal insufficiency 06/14/2015   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 Nile Riggs   Complication of anesthesia    Diverticulosis    per 2010 colonoscopy   Dyslipidemia    Full dentures    GERD (gastroesophageal reflux disease)    hx/o, resolved   Glaucoma 2016  H/O echocardiogram 06/22/2010   normal LVEF, moderate mitral and tricuspid regurg 07/2016 Dr. Rennis Golden;  mild aortic valve stenosis, left ventricular normal, EF 55%; Dr. Allyson Sabal  06/2010   History of cardiovascular stress test 06/22/2010   normal bruce myocadial perfusion study, 72% EF; Dr. Allyson Sabal   History of mammogram    benign bilat calcifications, heterogeneously dense breasts, stable mammograms 2009-2014   History of pneumonia    walking pneumonia "years ago"   History of uterine cancer 12/2002   s/p TAH and BSO, pelvic and periarotic lymphadenectomy 12/2002, no adjuvant therapy; stage 1b  carcinosarcoma of uterus, completed 5 years of f/u as of 2008; Dr. De Blanch gyencology oncology [place]; Dr. 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 Alanda Amass   Osteoporosis    PONV (postoperative nausea and vomiting)    SVT (supraventricular tachycardia)    asymptomatic 2014, hx/o SVT, Dr. Alanda Amass   Vitamin D deficiency     Family History  Family history unknown: Yes    Past Surgical History:  Procedure Laterality Date   ABDOMINAL HYSTERECTOMY Bilateral    h/o uterine cancer; oophrectomy   CATARACT EXTRACTION     bilat   COLONOSCOPY  08/2016   08/2016  Dr. Elnoria Howard advised no repeat colonoscopy;  scattered diverticla, medium hemorrhoids 2010; Dr. Elnoria Howard   JOINT REPLACEMENT     NM MYOCAR PERF WALL MOTION     THROAT SURGERY     some tissue removed from throat at [facility]   TONSILLECTOMY Right 12/28/2019   Procedure: TONSILLECTOMY;  Surgeon: Christia Reading, MD;  Location: [facility];  Service: ENT;  Laterality: Right;   TOTAL HIP ARTHROPLASTY Bilateral 2008, 2010    twice left (complication on the left, once on the right; Dr. Dorene Grebe   TOTAL HIP REVISION Right 06/03/2023   Procedure: RIGHT TOTAL HIP ARTHROPLASTY REVISION, POSTERIOR;  Surgeon: Tarry Kos, MD;  Location: [facility] OR;  Service: Orthopedics;  Laterality: Right;  3-C   TRANSTHORACIC ECHOCARDIOGRAM  02/2013   ordered for SVT; EF 55-65%; calcified MV annulus, mod MR; RSVP increased; RA mildly dilated; mod TR; PA peak pressure   Social History   Occupational History   Not on file  Tobacco Use   Smoking status: Never   Smokeless tobacco: Never  Vaping Use   Vaping status: Never Used  Substance and Sexual Activity   Alcohol use: No   Drug use: No   Sexual activity: Never    Birth control/protection: Surgical    Comment: walks 3x/wk, retired, married, 2  grandchildren

## 2023-07-17 DIAGNOSIS — M1990 Unspecified osteoarthritis, unspecified site: Secondary | ICD-10-CM | POA: Diagnosis not present

## 2023-07-17 DIAGNOSIS — E785 Hyperlipidemia, unspecified: Secondary | ICD-10-CM | POA: Diagnosis not present

## 2023-07-17 DIAGNOSIS — E559 Vitamin D deficiency, unspecified: Secondary | ICD-10-CM | POA: Diagnosis not present

## 2023-07-17 DIAGNOSIS — I10 Essential (primary) hypertension: Secondary | ICD-10-CM | POA: Diagnosis not present

## 2023-07-17 DIAGNOSIS — Z96641 Presence of right artificial hip joint: Secondary | ICD-10-CM | POA: Diagnosis not present

## 2023-07-17 DIAGNOSIS — F419 Anxiety disorder, unspecified: Secondary | ICD-10-CM | POA: Diagnosis not present

## 2023-07-17 DIAGNOSIS — I456 Pre-excitation syndrome: Secondary | ICD-10-CM | POA: Diagnosis not present

## 2023-07-17 DIAGNOSIS — T84010D Broken internal right hip prosthesis, subsequent encounter: Secondary | ICD-10-CM | POA: Diagnosis not present

## 2023-07-17 DIAGNOSIS — M810 Age-related osteoporosis without current pathological fracture: Secondary | ICD-10-CM | POA: Diagnosis not present

## 2023-07-22 DIAGNOSIS — I10 Essential (primary) hypertension: Secondary | ICD-10-CM | POA: Diagnosis not present

## 2023-07-22 DIAGNOSIS — M810 Age-related osteoporosis without current pathological fracture: Secondary | ICD-10-CM | POA: Diagnosis not present

## 2023-07-22 DIAGNOSIS — Z96641 Presence of right artificial hip joint: Secondary | ICD-10-CM | POA: Diagnosis not present

## 2023-07-22 DIAGNOSIS — M1990 Unspecified osteoarthritis, unspecified site: Secondary | ICD-10-CM | POA: Diagnosis not present

## 2023-07-22 DIAGNOSIS — I456 Pre-excitation syndrome: Secondary | ICD-10-CM | POA: Diagnosis not present

## 2023-07-22 DIAGNOSIS — E559 Vitamin D deficiency, unspecified: Secondary | ICD-10-CM | POA: Diagnosis not present

## 2023-07-22 DIAGNOSIS — T84010D Broken internal right hip prosthesis, subsequent encounter: Secondary | ICD-10-CM | POA: Diagnosis not present

## 2023-07-22 DIAGNOSIS — E785 Hyperlipidemia, unspecified: Secondary | ICD-10-CM | POA: Diagnosis not present

## 2023-07-22 DIAGNOSIS — F419 Anxiety disorder, unspecified: Secondary | ICD-10-CM | POA: Diagnosis not present

## 2023-07-29 DIAGNOSIS — M810 Age-related osteoporosis without current pathological fracture: Secondary | ICD-10-CM | POA: Diagnosis not present

## 2023-07-29 DIAGNOSIS — T84010D Broken internal right hip prosthesis, subsequent encounter: Secondary | ICD-10-CM | POA: Diagnosis not present

## 2023-07-29 DIAGNOSIS — E785 Hyperlipidemia, unspecified: Secondary | ICD-10-CM | POA: Diagnosis not present

## 2023-07-29 DIAGNOSIS — I10 Essential (primary) hypertension: Secondary | ICD-10-CM | POA: Diagnosis not present

## 2023-07-29 DIAGNOSIS — Z96641 Presence of right artificial hip joint: Secondary | ICD-10-CM | POA: Diagnosis not present

## 2023-07-29 DIAGNOSIS — I456 Pre-excitation syndrome: Secondary | ICD-10-CM | POA: Diagnosis not present

## 2023-07-29 DIAGNOSIS — M1990 Unspecified osteoarthritis, unspecified site: Secondary | ICD-10-CM | POA: Diagnosis not present

## 2023-07-29 DIAGNOSIS — E559 Vitamin D deficiency, unspecified: Secondary | ICD-10-CM | POA: Diagnosis not present

## 2023-07-29 DIAGNOSIS — F419 Anxiety disorder, unspecified: Secondary | ICD-10-CM | POA: Diagnosis not present

## 2023-08-12 ENCOUNTER — Ambulatory Visit: Payer: Medicare PPO | Admitting: Internal Medicine

## 2023-08-27 ENCOUNTER — Ambulatory Visit (INDEPENDENT_AMBULATORY_CARE_PROVIDER_SITE_OTHER): Payer: Medicare PPO | Admitting: Orthopaedic Surgery

## 2023-08-27 ENCOUNTER — Encounter: Payer: Self-pay | Admitting: Orthopaedic Surgery

## 2023-08-27 ENCOUNTER — Other Ambulatory Visit (INDEPENDENT_AMBULATORY_CARE_PROVIDER_SITE_OTHER): Payer: Medicare PPO

## 2023-08-27 DIAGNOSIS — M25551 Pain in right hip: Secondary | ICD-10-CM

## 2023-08-27 DIAGNOSIS — T84010A Broken internal right hip prosthesis, initial encounter: Secondary | ICD-10-CM

## 2023-08-27 NOTE — Progress Notes (Signed)
Post-Op Visit Note   Patient: [name]           Date of Birth: [birth year]           MRN: [id number] Visit Date: 08/27/2023 PCP: Ollen Bowl, MD   Assessment & Plan:  Chief Complaint:  Chief Complaint  Patient presents with   Right Hip - Follow-up    Right total hip arthroplasty revision 06/03/2023   Visit Diagnoses:  1. 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 Pain in right hip     Plan: Patient is now 12 weeks postop from revision of right acetabular component for metal on metal bearing.  She is doing well has no complaints.  She continues to use a cane for ambulation.  Examination shows a fully healed surgical scar.  Fluid painless range of motion of the hip.  X-rays demonstrate stable acetabular component without any loosening or subsidence.  I am happy with her progress and recovery.  I would like to see her back in 3 months with repeat x-rays of the right hip.  Follow-Up Instructions: Return in about 3 months (around 11/26/2023).   Orders:  Orders Placed This Encounter  Procedures   XR HIP UNILAT W OR W/O PELVIS 2-3 VIEWS RIGHT   No orders of the defined types were placed in this encounter.   Imaging: XR HIP UNILAT W OR W/O PELVIS 2-3 VIEWS RIGHT  Result Date: 08/27/2023 X-rays demonstrate stable right acetabular component status post revision.  No evidence of loosening or subsidence.   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   Tonsil asymmetry 11/03/2019   Abnormal chest x-ray 08/12/2019   Encounter for health maintenance examination in adult 08/12/2019   Need for influenza vaccination 08/12/2019   Chest wall pain 06/11/2019   Muscle strain  06/11/2019   Abnormal EKG 06/11/2019   Murmur 09/05/2018   Aortic atherosclerosis (HCC) 08/04/2018   Elevated ferritin 08/04/2018   Other fatigue 07/14/2018   Iron disorder 07/14/2018   Primary open angle glaucoma of both eyes, mild stage 10/14/2017   Glaucoma 07/11/2017   Vaccine counseling 07/11/2017   Varicose vein of leg 07/11/2017   Need for shingles vaccine 07/11/2017   Hearing loss 06/27/2016   Medicare annual wellness visit, subsequent 06/26/2016   Osteoporosis 01/12/2016   Dyslipidemia 06/14/2015   Essential hypertension 06/14/2015   Mitral valve regurgitation 06/14/2015   Tricuspid valve insufficiency 06/14/2015   Vitamin D deficiency 06/14/2015   Rhinitis, allergic 06/14/2015   Renal insufficiency 06/14/2015   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 Nile Riggs   Complication of anesthesia    Diverticulosis    per 2010 colonoscopy   Dyslipidemia    Full dentures    GERD (gastroesophageal reflux disease)    hx/o, resolved   Glaucoma 2016   H/O echocardiogram 06/22/2010  normal LVEF, moderate mitral and tricuspid regurg 07/2016 Dr. Rennis Golden;  mild aortic valve stenosis, left ventricular normal, EF 55%; Dr. Allyson Sabal  06/2010   History of cardiovascular stress test 06/22/2010   normal bruce myocadial perfusion study, 72% EF; Dr. Allyson Sabal   History of mammogram    benign bilat calcifications, heterogeneously dense breasts, stable mammograms 2009-2014   History of pneumonia    walking pneumonia "years ago"   History of uterine cancer 12/2002   s/p TAH and BSO, pelvic and periarotic lymphadenectomy 12/2002, no adjuvant therapy; stage 1b carcinosarcoma of uterus, completed 5 years of  f/u as of 2008; Dr. De Blanch gyencology oncology [place]; Dr. 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 Alanda Amass   Osteoporosis    PONV (postoperative nausea and vomiting)    SVT (supraventricular tachycardia)    asymptomatic 2014, hx/o SVT, Dr. Alanda Amass   Vitamin D deficiency     Family History  Family history unknown: Yes    Past Surgical History:  Procedure Laterality Date   ABDOMINAL HYSTERECTOMY Bilateral    h/o uterine cancer; oophrectomy   CATARACT EXTRACTION     bilat   COLONOSCOPY  08/2016   08/2016  Dr. Elnoria Howard advised no repeat colonoscopy;  scattered diverticla, medium hemorrhoids 2010; Dr. Elnoria Howard   JOINT REPLACEMENT     NM MYOCAR PERF WALL MOTION     THROAT SURGERY     some tissue removed from throat at [facility]   TONSILLECTOMY Right 12/28/2019   Procedure: TONSILLECTOMY;  Surgeon: Christia Reading, MD;  Location: [facility];  Service: ENT;  Laterality: Right;   TOTAL HIP ARTHROPLASTY Bilateral 2008, 2010    twice left (complication on the left, once on the right; Dr. Dorene Grebe   TOTAL HIP REVISION Right 06/03/2023   Procedure: RIGHT TOTAL HIP ARTHROPLASTY REVISION, POSTERIOR;  Surgeon: Tarry Kos, MD;  Location: [facility];  Service: Orthopedics;  Laterality: Right;  3-C   TRANSTHORACIC ECHOCARDIOGRAM  02/2013   ordered for SVT; EF 55-65%; calcified MV annulus, mod MR; RSVP increased; RA mildly dilated; mod TR; PA peak pressure   Social History   Occupational History   Not on file  Tobacco Use   Smoking status: Never   Smokeless tobacco: Never  Vaping Use   Vaping status: Never Used  Substance and Sexual Activity   Alcohol use: No   Drug use: No   Sexual activity: Never    Birth control/protection: Surgical    Comment: walks 3x/wk, retired, married, 2 grandchildren

## 2023-09-05 DIAGNOSIS — I739 Peripheral vascular disease, unspecified: Secondary | ICD-10-CM | POA: Diagnosis not present

## 2023-09-05 DIAGNOSIS — E559 Vitamin D deficiency, unspecified: Secondary | ICD-10-CM | POA: Diagnosis not present

## 2023-09-05 DIAGNOSIS — I272 Pulmonary hypertension, unspecified: Secondary | ICD-10-CM | POA: Diagnosis not present

## 2023-09-05 DIAGNOSIS — I38 Endocarditis, valve unspecified: Secondary | ICD-10-CM | POA: Diagnosis not present

## 2023-09-05 DIAGNOSIS — M810 Age-related osteoporosis without current pathological fracture: Secondary | ICD-10-CM | POA: Diagnosis not present

## 2023-09-05 DIAGNOSIS — I479 Paroxysmal tachycardia, unspecified: Secondary | ICD-10-CM | POA: Diagnosis not present

## 2023-09-05 DIAGNOSIS — E785 Hyperlipidemia, unspecified: Secondary | ICD-10-CM | POA: Diagnosis not present

## 2023-09-05 DIAGNOSIS — M48061 Spinal stenosis, lumbar region without neurogenic claudication: Secondary | ICD-10-CM | POA: Diagnosis not present

## 2023-09-05 DIAGNOSIS — N1831 Chronic kidney disease, stage 3a: Secondary | ICD-10-CM | POA: Diagnosis not present

## 2023-09-05 DIAGNOSIS — I10 Essential (primary) hypertension: Secondary | ICD-10-CM | POA: Diagnosis not present

## 2023-09-05 DIAGNOSIS — Z0000 Encounter for general adult medical examination without abnormal findings: Secondary | ICD-10-CM | POA: Diagnosis not present

## 2023-09-13 ENCOUNTER — Ambulatory Visit: Payer: Medicare PPO | Admitting: Nurse Practitioner

## 2023-09-16 DIAGNOSIS — C099 Malignant neoplasm of tonsil, unspecified: Secondary | ICD-10-CM | POA: Diagnosis not present

## 2023-09-27 NOTE — Progress Notes (Unsigned)
Cardiology Office Note:  .   Date:  10/03/2023  ID:  [name], DOB 1933-08-28, MRN [id number] PCP: Ollen Bowl, MD  [facility] Providers Cardiologist:  Chrystie Nose, MD {    History of Present Illness: .   [name] is a 89 y.o. female with a past medical history of PSVT/AVNRT, mitral valve regurgitation, tricuspid valve regurgitation, hypertension, hyperlipidemia, uterine cancer, glaucoma, GERD.  Patient is followed by Dr. Rennis Golden and presents today for 49-month follow-up appointment.  Patient previously had been on flecainide for SVT, but this was discontinued.  Currently on beta-blocker therapy.  She previously underwent stress test in 2011 that was negative for ischemia. Echocardiogram in 2014 showed normal EF, moderate MR, moderate TR.  Repeat echo in 06/2019 showed EF greater than 65%, normal RV function mild MR, mild TR.  Underwent nuclear stress test in 07/2019 that was mildly abnormal with mild inferolateral ischemia. Overall was low risk and medical management was recommended. Patient's most recent echocardiogram from 02/26/2022 showed EF 60 to 65%, no regional wall motion abnormalities, normal RV function, mild-moderate mitral valve regurgitation, moderate-severe tricuspid valve regurgitation.  Patient was seen in the office in 12/18/2022 and reported a feeling of "fullness" in her feet.  Lower extremity Dopplers were negative for DVT but did show a cyst in the right popliteal fossa, 2 avascular complex masses.  It was recommended that patient follow-up with PCP.  Also underwent ABIs that were essentially normal. Patient was last seen in clinic on 01/28/2023.  At that time, patient's BP was well-controlled and she was doing well from a cardiac standpoint.  Had right total hip arthroplasty revision on 06/03/2023.  Today, patient denies cardiac complaints. She reports that she underwent hip surgery in June. The recovery process is ongoing, with the patient reporting  some residual stiffness and weakness, but overall improvement. Since surgery, the feelings of "fullness" in her feet has resolved. She has been managing her rehabilitation independently, following a home exercise regimen provided by her physical therapist. The patient uses a cane for mobility, particularly when outside the home, but can ambulate short distances without it. The patient was on a blood thinner post-operatively, but has since discontinued it.  The patient's blood pressure has been well-controlled at home, typically ranging from 122-129/70, on a regimen of Atenolol and Lisinopril. She has a history of supraventricular tachycardia (SVT), which is managed with Atenolol. The patient denies any current palpitations. Denies shortness of breath, chest pain, ankle edema, cough.   The patient also has a history of hyperlipidemia, managed with a statin. She reports a recent increase in cholesterol levels, which is being monitored by her primary care provider. The patient is compliant with her statin medication, taking it every evening.  ROS: Denies chest pain, palpitations, shortness of breath, ankle edema, syncope, near syncope, dizziness.   Studies Reviewed: .   Cardiac Studies & Procedures     STRESS TESTS  MYOCARDIAL PERFUSION IMAGING 07/21/2019  Narrative  The left ventricular ejection fraction is mildly decreased (45-54%).  Nuclear stress EF: 52%.  There was no ST segment deviation noted during stress.  Findings consistent with ischemia.  This is a low risk study.  Mildly abnormal stress nuclear study with mild inferobasal ischemia; EF 52 with normal wall motion.   ECHOCARDIOGRAM  ECHOCARDIOGRAM COMPLETE 02/26/2022  Narrative ECHOCARDIOGRAM REPORT    Patient Name:   [name] Date of Exam: 02/26/2022 Medical Rec #:  [id number]  Height:       62.0 in Accession #:    [id number]         Weight:       143.6 lb Date of Birth:  [birth year]          BSA:           1.661 m Patient Age:    88 years           BP:           132/70 mmHg Patient Gender: F                  HR:           73 bpm. Exam Location:  [address]  Procedure: 2D Echo, 3D Echo, Cardiac Doppler, Color Doppler and Strain Analysis  Indications:    I34 Mitral regurgitation  History:        Patient has prior history of Echocardiogram examinations, most recent 06/30/2019. Signs/Symptoms:Murmur; Risk Factors:Hypertension and Dyslipidemia.  Sonographer:    Jorje Guild BS, RDCS Referring Phys: [id number] KENNETH C HILTY  IMPRESSIONS   1. Left ventricular ejection fraction, by estimation, is 60 to 65%. The left ventricle has normal function. The left ventricle has no regional wall motion abnormalities. Left ventricular diastolic parameters were normal. The average left ventricular global longitudinal strain is -22.4 %. The global longitudinal strain is normal. 2. Right ventricular systolic function is normal. The right ventricular size is normal. There is mildly elevated pulmonary artery systolic pressure. The estimated right ventricular systolic pressure is 41.7 mmHg. 3. Left atrial size was moderately dilated. 4. The mitral valve is abnormal. Mild to moderate mitral valve regurgitation. 5. Tricuspid valve regurgitation is moderate to severe. 6. The aortic valve is tricuspid. Aortic valve regurgitation is not visualized. Aortic valve sclerosis is present, with no evidence of aortic valve stenosis. 7. The inferior vena cava is normal in size with greater than 50% respiratory variability, suggesting right atrial pressure of 3 mmHg.  Comparison(s): 06/30/19: LVEF >65%. Mild MR.  FINDINGS Left Ventricle: Left ventricular ejection fraction, by estimation, is 60 to 65%. The left ventricle has normal function. The left ventricle has no regional wall motion abnormalities. The average left ventricular global longitudinal strain is -22.4 %. The global longitudinal strain is normal. The left  ventricular internal cavity size was normal in size. There is no left ventricular hypertrophy. Left ventricular diastolic parameters were normal.  Right Ventricle: The right ventricular size is normal. No increase in right ventricular wall thickness. Right ventricular systolic function is normal. There is mildly elevated pulmonary artery systolic pressure. The tricuspid regurgitant velocity is 3.11 m/s, and with an assumed right atrial pressure of 3 mmHg, the estimated right ventricular systolic pressure is 41.7 mmHg.  Left Atrium: Left atrial size was moderately dilated.  Right Atrium: Right atrial size was normal in size.  Pericardium: There is no evidence of pericardial effusion.  Mitral Valve: The mitral valve is abnormal. There is moderate thickening of the anterior and posterior mitral valve leaflet(s). Mild to moderate mitral valve regurgitation.  Tricuspid Valve: The tricuspid valve is grossly normal. Tricuspid valve regurgitation is moderate to severe.  Aortic Valve: The aortic valve is tricuspid. Aortic valve regurgitation is not visualized. Aortic valve sclerosis is present, with no evidence of aortic valve stenosis.  Pulmonic Valve: The pulmonic valve was grossly normal. Pulmonic valve regurgitation is not visualized.  Aorta: The aortic root and ascending aorta are structurally normal, with no evidence of dilitation.  Cardiology Office Note:  .   Date:  10/03/2023  ID:  [name], DOB 1933-08-28, MRN [id number] PCP: Ollen Bowl, MD  [facility] Providers Cardiologist:  Chrystie Nose, MD {    History of Present Illness: .   [name] is a 89 y.o. female with a past medical history of PSVT/AVNRT, mitral valve regurgitation, tricuspid valve regurgitation, hypertension, hyperlipidemia, uterine cancer, glaucoma, GERD.  Patient is followed by Dr. Rennis Golden and presents today for 49-month follow-up appointment.  Patient previously had been on flecainide for SVT, but this was discontinued.  Currently on beta-blocker therapy.  She previously underwent stress test in 2011 that was negative for ischemia. Echocardiogram in 2014 showed normal EF, moderate MR, moderate TR.  Repeat echo in 06/2019 showed EF greater than 65%, normal RV function mild MR, mild TR.  Underwent nuclear stress test in 07/2019 that was mildly abnormal with mild inferolateral ischemia. Overall was low risk and medical management was recommended. Patient's most recent echocardiogram from 02/26/2022 showed EF 60 to 65%, no regional wall motion abnormalities, normal RV function, mild-moderate mitral valve regurgitation, moderate-severe tricuspid valve regurgitation.  Patient was seen in the office in 12/18/2022 and reported a feeling of "fullness" in her feet.  Lower extremity Dopplers were negative for DVT but did show a cyst in the right popliteal fossa, 2 avascular complex masses.  It was recommended that patient follow-up with PCP.  Also underwent ABIs that were essentially normal. Patient was last seen in clinic on 01/28/2023.  At that time, patient's BP was well-controlled and she was doing well from a cardiac standpoint.  Had right total hip arthroplasty revision on 06/03/2023.  Today, patient denies cardiac complaints. She reports that she underwent hip surgery in June. The recovery process is ongoing, with the patient reporting  some residual stiffness and weakness, but overall improvement. Since surgery, the feelings of "fullness" in her feet has resolved. She has been managing her rehabilitation independently, following a home exercise regimen provided by her physical therapist. The patient uses a cane for mobility, particularly when outside the home, but can ambulate short distances without it. The patient was on a blood thinner post-operatively, but has since discontinued it.  The patient's blood pressure has been well-controlled at home, typically ranging from 122-129/70, on a regimen of Atenolol and Lisinopril. She has a history of supraventricular tachycardia (SVT), which is managed with Atenolol. The patient denies any current palpitations. Denies shortness of breath, chest pain, ankle edema, cough.   The patient also has a history of hyperlipidemia, managed with a statin. She reports a recent increase in cholesterol levels, which is being monitored by her primary care provider. The patient is compliant with her statin medication, taking it every evening.  ROS: Denies chest pain, palpitations, shortness of breath, ankle edema, syncope, near syncope, dizziness.   Studies Reviewed: .   Cardiac Studies & Procedures     STRESS TESTS  MYOCARDIAL PERFUSION IMAGING 07/21/2019  Narrative  The left ventricular ejection fraction is mildly decreased (45-54%).  Nuclear stress EF: 52%.  There was no ST segment deviation noted during stress.  Findings consistent with ischemia.  This is a low risk study.  Mildly abnormal stress nuclear study with mild inferobasal ischemia; EF 52 with normal wall motion.   ECHOCARDIOGRAM  ECHOCARDIOGRAM COMPLETE 02/26/2022  Narrative ECHOCARDIOGRAM REPORT    Patient Name:   [name] Date of Exam: 02/26/2022 Medical Rec #:  [id number]  Cardiology Office Note:  .   Date:  10/03/2023  ID:  [name], DOB 1933-08-28, MRN [id number] PCP: Ollen Bowl, MD  [facility] Providers Cardiologist:  Chrystie Nose, MD {    History of Present Illness: .   [name] is a 89 y.o. female with a past medical history of PSVT/AVNRT, mitral valve regurgitation, tricuspid valve regurgitation, hypertension, hyperlipidemia, uterine cancer, glaucoma, GERD.  Patient is followed by Dr. Rennis Golden and presents today for 49-month follow-up appointment.  Patient previously had been on flecainide for SVT, but this was discontinued.  Currently on beta-blocker therapy.  She previously underwent stress test in 2011 that was negative for ischemia. Echocardiogram in 2014 showed normal EF, moderate MR, moderate TR.  Repeat echo in 06/2019 showed EF greater than 65%, normal RV function mild MR, mild TR.  Underwent nuclear stress test in 07/2019 that was mildly abnormal with mild inferolateral ischemia. Overall was low risk and medical management was recommended. Patient's most recent echocardiogram from 02/26/2022 showed EF 60 to 65%, no regional wall motion abnormalities, normal RV function, mild-moderate mitral valve regurgitation, moderate-severe tricuspid valve regurgitation.  Patient was seen in the office in 12/18/2022 and reported a feeling of "fullness" in her feet.  Lower extremity Dopplers were negative for DVT but did show a cyst in the right popliteal fossa, 2 avascular complex masses.  It was recommended that patient follow-up with PCP.  Also underwent ABIs that were essentially normal. Patient was last seen in clinic on 01/28/2023.  At that time, patient's BP was well-controlled and she was doing well from a cardiac standpoint.  Had right total hip arthroplasty revision on 06/03/2023.  Today, patient denies cardiac complaints. She reports that she underwent hip surgery in June. The recovery process is ongoing, with the patient reporting  some residual stiffness and weakness, but overall improvement. Since surgery, the feelings of "fullness" in her feet has resolved. She has been managing her rehabilitation independently, following a home exercise regimen provided by her physical therapist. The patient uses a cane for mobility, particularly when outside the home, but can ambulate short distances without it. The patient was on a blood thinner post-operatively, but has since discontinued it.  The patient's blood pressure has been well-controlled at home, typically ranging from 122-129/70, on a regimen of Atenolol and Lisinopril. She has a history of supraventricular tachycardia (SVT), which is managed with Atenolol. The patient denies any current palpitations. Denies shortness of breath, chest pain, ankle edema, cough.   The patient also has a history of hyperlipidemia, managed with a statin. She reports a recent increase in cholesterol levels, which is being monitored by her primary care provider. The patient is compliant with her statin medication, taking it every evening.  ROS: Denies chest pain, palpitations, shortness of breath, ankle edema, syncope, near syncope, dizziness.   Studies Reviewed: .   Cardiac Studies & Procedures     STRESS TESTS  MYOCARDIAL PERFUSION IMAGING 07/21/2019  Narrative  The left ventricular ejection fraction is mildly decreased (45-54%).  Nuclear stress EF: 52%.  There was no ST segment deviation noted during stress.  Findings consistent with ischemia.  This is a low risk study.  Mildly abnormal stress nuclear study with mild inferobasal ischemia; EF 52 with normal wall motion.   ECHOCARDIOGRAM  ECHOCARDIOGRAM COMPLETE 02/26/2022  Narrative ECHOCARDIOGRAM REPORT    Patient Name:   [name] Date of Exam: 02/26/2022 Medical Rec #:  [id number]

## 2023-10-03 ENCOUNTER — Encounter: Payer: Self-pay | Admitting: Cardiology

## 2023-10-03 ENCOUNTER — Ambulatory Visit: Payer: Medicare PPO | Attending: Internal Medicine | Admitting: Cardiology

## 2023-10-03 VITALS — BP 142/78 | HR 81 | Ht 61.0 in | Wt 129.0 lb

## 2023-10-03 DIAGNOSIS — I471 Supraventricular tachycardia, unspecified: Secondary | ICD-10-CM | POA: Diagnosis not present

## 2023-10-03 DIAGNOSIS — I493 Ventricular premature depolarization: Secondary | ICD-10-CM

## 2023-10-03 DIAGNOSIS — I10 Essential (primary) hypertension: Secondary | ICD-10-CM | POA: Diagnosis not present

## 2023-10-03 DIAGNOSIS — I071 Rheumatic tricuspid insufficiency: Secondary | ICD-10-CM

## 2023-10-03 DIAGNOSIS — E785 Hyperlipidemia, unspecified: Secondary | ICD-10-CM

## 2023-10-03 DIAGNOSIS — I340 Nonrheumatic mitral (valve) insufficiency: Secondary | ICD-10-CM | POA: Diagnosis not present

## 2023-10-03 DIAGNOSIS — I491 Atrial premature depolarization: Secondary | ICD-10-CM

## 2023-10-03 NOTE — Patient Instructions (Signed)
Medication Instructions:  No changes *If you need a refill on your cardiac medications before your next appointment, please call your pharmacy*   Lab Work: No labs   Testing/Procedures: No testing   Follow-Up: At [facility], you and your health needs are our priority.  As part of our continuing mission to provide you with exceptional heart care, we have created designated Provider Care Teams.  These Care Teams include your primary Cardiologist (physician) and Advanced Practice Providers (APPs -  Physician Assistants and Nurse Practitioners) who all work together to provide you with the care you need, when you need it.  We recommend signing up for the patient portal called "MyChart".  Sign up information is provided on this After Visit Summary.  MyChart is used to connect with patients for Virtual Visits (Telemedicine).  Patients are able to view lab/test results, encounter notes, upcoming appointments, etc.  Non-urgent messages can be sent to your provider as well.   To learn more about what you can do with MyChart, go to [email or web address].    Your next appointment:   6 month(s)  Provider:   Any APP

## 2023-10-15 DIAGNOSIS — H2513 Age-related nuclear cataract, bilateral: Secondary | ICD-10-CM | POA: Diagnosis not present

## 2023-11-01 ENCOUNTER — Other Ambulatory Visit: Payer: Self-pay | Admitting: Internal Medicine

## 2023-11-01 DIAGNOSIS — Z1231 Encounter for screening mammogram for malignant neoplasm of breast: Secondary | ICD-10-CM

## 2023-11-11 DIAGNOSIS — H2513 Age-related nuclear cataract, bilateral: Secondary | ICD-10-CM | POA: Diagnosis not present

## 2023-11-11 DIAGNOSIS — H401133 Primary open-angle glaucoma, bilateral, severe stage: Secondary | ICD-10-CM | POA: Diagnosis not present

## 2023-11-25 NOTE — Progress Notes (Unsigned)
Office Visit Note   Patient: [name]           Date of Birth: Jul 13, 1933           MRN: [id number] Visit Date: 11/26/2023              Requested by: Ollen Bowl, MD [address] [place],  [place] [address] PCP: Ollen Bowl, MD   Assessment & Plan: Visit Diagnoses:  1. 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  She is doing well and has no real complaints.  Recheck in 6 months with repeat imaging.  Follow-Up Instructions: Return in about 6 months (around 05/26/2024).   Orders:  Orders Placed This Encounter  Procedures   XR Pelvis 1-2 Views   No orders of the defined types were placed in this encounter.     Procedures: No procedures performed   Clinical Data: No additional findings.   Subjective: Chief Complaint  Patient presents with   Right Hip - Pain    HPI [name] is 6 months postop from revision from metal-on-metal bearing.  She is doing well overall.  Using a cane.  No real complaints. Review of Systems   Objective: Vital Signs: There were no vitals taken for this visit.  Physical Exam  Ortho Exam Exam of the right hip is benign.  Healed surgical scar. Specialty Comments:  EXAM: MRI LUMBAR SPINE WITHOUT CONTRAST   TECHNIQUE: Multiplanar, multisequence MR imaging of the lumbar spine was performed. No intravenous contrast was administered.   COMPARISON:  Lumbar spine MRI 10/25/2018.   FINDINGS: Segmentation: Conventional numbering is assumed with 5 non-rib-bearing, lumbar type vertebral bodies.   Alignment: Levo scoliotic curvature of the lumbar spine. Unchanged grade 1 anterolisthesis of L3 on L4 with trace retrolisthesis of L2 on L3.   Vertebrae: Heterogeneous marrow signal. Modic type 1 degenerative endplate marrow signal changes at L4-5, unchanged.    Conus medullaris and cauda equina: Conus extends to the L2-3 level. Conus and cauda equina appear normal.   Paraspinal and other soft tissues: Fatty atrophy of the paraspinal muscles.   Disc levels:   T12-L1: Disc bulge and facet arthropathy results in mild spinal canal stenosis, new from prior. No neural foraminal narrowing.   L1-L2: Small disc bulge and endplate osteophytes without significant spinal canal stenosis. Facet arthropathy contributes to moderate right neural foraminal narrowing, unchanged.   L2-L3: Small disc osteophyte complex without significant spinal canal stenosis. Moderate disc height loss and facet arthropathy results in severe right and moderate left neural foraminal narrowing, unchanged.   L3-L4: Right eccentric disc bulge and facet arthropathy results in mild spinal canal stenosis and moderate right neural foraminal narrowing, unchanged.   L4-L5: Left eccentric disc bulge and facet arthropathy results in compression of the traversing left L5 nerve root in the lateral recess, worse from prior. Unchanged mild bilateral neural foraminal narrowing.   L5-S1: Left eccentric disc bulge and facet arthropathy results in compression of the traversing left S1 nerve root in the lateral recess, worse from prior. Unchanged mild left neural foraminal narrowing.   IMPRESSION: 1. Slight progression of lumbar spondylosis with new compression of the traversing left L5 and S1 nerve roots in the lateral recesses at L4-5 and L5-S1, respectively. 2. New mild spinal canal stenosis at T12-L1. 3. Otherwise unchanged moderate to severe neural foraminal  narrowing at multiple levels, as described above.     Electronically Signed   By: Orvan Falconer M.D.   On: 02/19/2023 09:14  Imaging: XR Pelvis 1-2 Views  Result Date: 11/26/2023 X-rays of the pelvis show stable revision acetabular component without any evidence of loosening or migration.    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   Tonsil asymmetry 11/03/2019   Abnormal chest x-ray 08/12/2019   Encounter for health maintenance examination in adult 08/12/2019   Need for influenza vaccination 08/12/2019   Chest wall pain 06/11/2019   Muscle strain 06/11/2019   Abnormal EKG 06/11/2019   Murmur 09/05/2018   Aortic atherosclerosis (HCC) 08/04/2018   Elevated ferritin 08/04/2018   Other fatigue 07/14/2018   Iron disorder 07/14/2018   Primary open angle glaucoma of both eyes, mild stage 10/14/2017   Glaucoma 07/11/2017   Vaccine counseling 07/11/2017   Varicose vein of leg 07/11/2017   Need for shingles vaccine 07/11/2017   Hearing loss 06/27/2016   Medicare annual wellness visit, subsequent 06/26/2016   Osteoporosis 01/12/2016   Dyslipidemia 06/14/2015   Essential hypertension 06/14/2015   Mitral valve regurgitation 06/14/2015   Tricuspid valve insufficiency 06/14/2015   Vitamin D deficiency 06/14/2015   Rhinitis, allergic 06/14/2015   Renal insufficiency 06/14/2015   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 Nile Riggs   Complication of anesthesia    Diverticulosis    per 2010 colonoscopy   Dyslipidemia    Full dentures    GERD (gastroesophageal reflux disease)    hx/o, resolved   Glaucoma 2016   H/O echocardiogram 06/22/2010   normal LVEF, moderate mitral and tricuspid regurg 07/2016 Dr. Rennis Golden;  mild aortic valve stenosis, left ventricular  normal, EF 55%; Dr. Allyson Sabal  06/2010   History of cardiovascular stress test 06/22/2010   normal bruce myocadial perfusion study, 72% EF; Dr. Allyson Sabal   History of mammogram    benign bilat calcifications, heterogeneously dense breasts, stable mammograms 2009-2014   History of pneumonia    walking pneumonia "years ago"   History of uterine cancer 12/2002   s/p TAH and BSO, pelvic and periarotic lymphadenectomy 12/2002, no adjuvant therapy; stage 1b carcinosarcoma of uterus, completed 5 years of f/u as of 2008; Dr. De Blanch gyencology oncology [place]; Dr. 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 Alanda Amass   Osteoporosis    PONV (postoperative nausea and vomiting)    SVT (supraventricular tachycardia) (HCC)    asymptomatic 2014, hx/o SVT, Dr. Alanda Amass   Vitamin D deficiency     Family History  Family history unknown: Yes    Past Surgical History:  Procedure Laterality Date   ABDOMINAL HYSTERECTOMY Bilateral    h/o uterine cancer; oophrectomy   CATARACT EXTRACTION  bilat   COLONOSCOPY  08/2016   08/2016  Dr. Elnoria Howard advised no repeat colonoscopy;  scattered diverticla, medium hemorrhoids 2010; Dr. Elnoria Howard   JOINT REPLACEMENT     NM MYOCAR PERF WALL MOTION     THROAT SURGERY     some tissue removed from throat at [facility]   TONSILLECTOMY Right 12/28/2019   Procedure: TONSILLECTOMY;  Surgeon: Christia Reading, MD;  Location: [facility];  Service: ENT;  Laterality: Right;   TOTAL HIP ARTHROPLASTY Bilateral 2008, 2010    twice left (complication on the left, once on the right; Dr. Dorene Grebe   TOTAL HIP REVISION Right 06/03/2023   Procedure: RIGHT TOTAL HIP ARTHROPLASTY REVISION, POSTERIOR;  Surgeon: Tarry Kos, MD;  Location: [facility] OR;  Service: Orthopedics;  Laterality: Right;  3-C   TRANSTHORACIC ECHOCARDIOGRAM  02/2013   ordered for SVT;  EF 55-65%; calcified MV annulus, mod MR; RSVP increased; RA mildly dilated; mod TR; PA peak pressure   Social History   Occupational History   Not on file  Tobacco Use   Smoking status: Never   Smokeless tobacco: Never  Vaping Use   Vaping status: Never Used  Substance and Sexual Activity   Alcohol use: No   Drug use: No   Sexual activity: Never    Birth control/protection: Surgical    Comment: walks 3x/wk, retired, married, 2 grandchildren

## 2023-11-26 ENCOUNTER — Other Ambulatory Visit (INDEPENDENT_AMBULATORY_CARE_PROVIDER_SITE_OTHER): Payer: Self-pay

## 2023-11-26 ENCOUNTER — Ambulatory Visit: Payer: Medicare PPO | Admitting: Orthopaedic Surgery

## 2023-11-26 ENCOUNTER — Encounter: Payer: Self-pay | Admitting: Orthopaedic Surgery

## 2023-11-26 DIAGNOSIS — T84010A Broken internal right hip prosthesis, initial encounter: Secondary | ICD-10-CM

## 2023-11-28 ENCOUNTER — Ambulatory Visit: Payer: Medicare PPO

## 2023-12-03 DIAGNOSIS — Z1231 Encounter for screening mammogram for malignant neoplasm of breast: Secondary | ICD-10-CM | POA: Diagnosis not present

## 2023-12-26 DIAGNOSIS — H6121 Impacted cerumen, right ear: Secondary | ICD-10-CM | POA: Diagnosis not present

## 2024-03-04 DIAGNOSIS — I272 Pulmonary hypertension, unspecified: Secondary | ICD-10-CM | POA: Diagnosis not present

## 2024-03-04 DIAGNOSIS — I5189 Other ill-defined heart diseases: Secondary | ICD-10-CM | POA: Diagnosis not present

## 2024-03-04 DIAGNOSIS — E559 Vitamin D deficiency, unspecified: Secondary | ICD-10-CM | POA: Diagnosis not present

## 2024-03-04 DIAGNOSIS — I739 Peripheral vascular disease, unspecified: Secondary | ICD-10-CM | POA: Diagnosis not present

## 2024-03-04 DIAGNOSIS — E785 Hyperlipidemia, unspecified: Secondary | ICD-10-CM | POA: Diagnosis not present

## 2024-03-04 DIAGNOSIS — I479 Paroxysmal tachycardia, unspecified: Secondary | ICD-10-CM | POA: Diagnosis not present

## 2024-03-04 DIAGNOSIS — M810 Age-related osteoporosis without current pathological fracture: Secondary | ICD-10-CM | POA: Diagnosis not present

## 2024-03-04 DIAGNOSIS — H401130 Primary open-angle glaucoma, bilateral, stage unspecified: Secondary | ICD-10-CM | POA: Diagnosis not present

## 2024-03-04 DIAGNOSIS — N1831 Chronic kidney disease, stage 3a: Secondary | ICD-10-CM | POA: Diagnosis not present

## 2024-03-04 DIAGNOSIS — I10 Essential (primary) hypertension: Secondary | ICD-10-CM | POA: Diagnosis not present

## 2024-03-12 DIAGNOSIS — I739 Peripheral vascular disease, unspecified: Secondary | ICD-10-CM | POA: Diagnosis not present

## 2024-03-12 DIAGNOSIS — I471 Supraventricular tachycardia, unspecified: Secondary | ICD-10-CM | POA: Diagnosis not present

## 2024-03-12 DIAGNOSIS — I272 Pulmonary hypertension, unspecified: Secondary | ICD-10-CM | POA: Diagnosis not present

## 2024-03-12 DIAGNOSIS — M810 Age-related osteoporosis without current pathological fracture: Secondary | ICD-10-CM | POA: Diagnosis not present

## 2024-03-12 DIAGNOSIS — I129 Hypertensive chronic kidney disease with stage 1 through stage 4 chronic kidney disease, or unspecified chronic kidney disease: Secondary | ICD-10-CM | POA: Diagnosis not present

## 2024-03-12 DIAGNOSIS — M1990 Unspecified osteoarthritis, unspecified site: Secondary | ICD-10-CM | POA: Diagnosis not present

## 2024-03-12 DIAGNOSIS — E785 Hyperlipidemia, unspecified: Secondary | ICD-10-CM | POA: Diagnosis not present

## 2024-03-12 DIAGNOSIS — N1832 Chronic kidney disease, stage 3b: Secondary | ICD-10-CM | POA: Diagnosis not present

## 2024-03-12 DIAGNOSIS — M545 Low back pain, unspecified: Secondary | ICD-10-CM | POA: Diagnosis not present

## 2024-03-16 DIAGNOSIS — C099 Malignant neoplasm of tonsil, unspecified: Secondary | ICD-10-CM | POA: Diagnosis not present

## 2024-03-29 NOTE — Progress Notes (Signed)
 Cardiology Office Note:  .   Date:  04/09/2024  ID:  [name], DOB 1933/02/25, MRN [id number] PCP: Elester Grim, MD  [facility] Providers Cardiologist:  Hazle Lites, MD  History of Present Illness: .   [name] is a 89 y.o. female with a past medical history of PSVT/AVNRT, mitral valve regurgitation, tricuspid valve regurgitation, hypertension, hyperlipidemia, uterine cancer, glaucoma, GERD.  Patient is followed by Dr. Maximo Spar and presents today for 14-month follow-up appointment.   Patient previously had been on flecainide for SVT, but this was discontinued.  Currently on beta-blocker therapy.  She previously underwent stress test in 2011 that was negative for ischemia. Echocardiogram in 2014 showed normal EF, moderate MR, moderate TR.  Repeat echo in 06/2019 showed EF greater than 65%, normal RV function mild MR, mild TR.  Underwent nuclear stress test in 07/2019 that was mildly abnormal with mild inferolateral ischemia. Overall was low risk and medical management was recommended. Patient's most recent echocardiogram from 02/26/2022 showed EF 60 to 65%, no regional wall motion abnormalities, normal RV function, mild-moderate mitral valve regurgitation, moderate-severe tricuspid valve regurgitation.   Patient was seen in the office in 12/18/2022 and reported a feeling of "fullness" in her feet.  Lower extremity Dopplers were negative for DVT but did show a cyst in the right popliteal fossa, 2 avascular complex masses.  It was recommended that patient follow-up with PCP.  Also underwent ABIs that were essentially normal. Patient was last seen in clinic by me on 10/03/23. At that time, patient denied cardiac complaints. She had undergone ip surgery in 05/2023, and her leg fullness had resolved. She was doing physical therapy and recovering well. BP was well controlled and she did not have any palpitations.   Today, patient reports that she has been doing well from a cardiac  standpoint. She denies chest pain, shortness of breath, palpitations, syncope, near syncope, lightheadedness. She had hip surgery in 05/2023 and is recovering well from that. She stays active by walking at the [facility], walking around [place] and malls. Denies chest pain or SOB on exertion. She previously had issues with swelling in her L leg, but this has completely resolved since she had her hip surgery. She checks her BP every day at home. Reports that her BP is consistently in the 120s/60s-70s. She follows a low sodium diet at home    ROS: Per HPI   Studies Reviewed: .       Cardiac Studies & Procedures   ______________________________________________________________________________________________   STRESS TESTS  MYOCARDIAL PERFUSION IMAGING 07/21/2019  Narrative  The left ventricular ejection fraction is mildly decreased (45-54%).  Nuclear stress EF: 52%.  There was no ST segment deviation noted during stress.  Findings consistent with ischemia.  This is a low risk study.  Mildly abnormal stress nuclear study with mild inferobasal ischemia; EF 52 with normal wall motion.   ECHOCARDIOGRAM  ECHOCARDIOGRAM COMPLETE 02/26/2022  Narrative ECHOCARDIOGRAM REPORT    Patient Name:   [name] Date of Exam: 02/26/2022 Medical Rec #:  [id number]          Height:       62.0 in Accession #:    [id number]         Weight:       143.6 lb Date of Birth:  23-Jul-1933          BSA:          1.661 m Patient Age:    49 years  BP:           132/70 mmHg Patient Gender: F                  HR:           73 bpm. Exam Location:  [address]  Procedure: 2D Echo, 3D Echo, Cardiac Doppler, Color Doppler and Strain Analysis  Indications:    I34 Mitral regurgitation  History:        Patient has prior history of Echocardiogram examinations, most recent 06/30/2019. Signs/Symptoms:Murmur; Risk Factors:Hypertension and Dyslipidemia.  Sonographer:    Verena Glaser BS, RDCS Referring  Phys: [phone] KENNETH C HILTY  IMPRESSIONS   1. Left ventricular ejection fraction, by estimation, is 60 to 65%. The left ventricle has normal function. The left ventricle has no regional wall motion abnormalities. Left ventricular diastolic parameters were normal. The average left ventricular global longitudinal strain is -22.4 %. The global longitudinal strain is normal. 2. Right ventricular systolic function is normal. The right ventricular size is normal. There is mildly elevated pulmonary artery systolic pressure. The estimated right ventricular systolic pressure is 41.7 mmHg. 3. Left atrial size was moderately dilated. 4. The mitral valve is abnormal. Mild to moderate mitral valve regurgitation. 5. Tricuspid valve regurgitation is moderate to severe. 6. The aortic valve is tricuspid. Aortic valve regurgitation is not visualized. Aortic valve sclerosis is present, with no evidence of aortic valve stenosis. 7. The inferior vena cava is normal in size with greater than 50% respiratory variability, suggesting right atrial pressure of 3 mmHg.  Comparison(s): 06/30/19: LVEF >65%. Mild MR.  FINDINGS Left Ventricle: Left ventricular ejection fraction, by estimation, is 60 to 65%. The left ventricle has normal function. The left ventricle has no regional wall motion abnormalities. The average left ventricular global longitudinal strain is -22.4 %. The global longitudinal strain is normal. The left ventricular internal cavity size was normal in size. There is no left ventricular hypertrophy. Left ventricular diastolic parameters were normal.  Right Ventricle: The right ventricular size is normal. No increase in right ventricular wall thickness. Right ventricular systolic function is normal. There is mildly elevated pulmonary artery systolic pressure. The tricuspid regurgitant velocity is 3.11 m/s, and with an assumed right atrial pressure of 3 mmHg, the estimated right ventricular systolic pressure is  41.7 mmHg.  Left Atrium: Left atrial size was moderately dilated.  Right Atrium: Right atrial size was normal in size.  Pericardium: There is no evidence of pericardial effusion.  Mitral Valve: The mitral valve is abnormal. There is moderate thickening of the anterior and posterior mitral valve leaflet(s). Mild to moderate mitral valve regurgitation.  Tricuspid Valve: The tricuspid valve is grossly normal. Tricuspid valve regurgitation is moderate to severe.  Aortic Valve: The aortic valve is tricuspid. Aortic valve regurgitation is not visualized. Aortic valve sclerosis is present, with no evidence of aortic valve stenosis.  Pulmonic Valve: The pulmonic valve was grossly normal. Pulmonic valve regurgitation is not visualized.  Aorta: The aortic root and ascending aorta are structurally normal, with no evidence of dilitation.  Venous: The inferior vena cava is normal in size with greater than 50% respiratory variability, suggesting right atrial pressure of 3 mmHg.  IAS/Shunts: No atrial level shunt detected by color flow Doppler.   LEFT VENTRICLE PLAX 2D LVIDd:         4.00 cm   Diastology LVIDs:         2.80 cm   LV e' medial:    11.60 cm/s  LV PW:         0.70 cm   LV E/e' medial:  8.4 LV IVS:        0.90 cm   LV e' lateral:   13.40 cm/s LVOT diam:     2.00 cm   LV E/e' lateral: 7.3 LV SV:         58 LV SV Index:   35        2D Longitudinal Strain LVOT Area:     3.14 cm  2D Strain GLS (A2C):   -20.7 % 2D Strain GLS (A3C):   -22.3 % 2D Strain GLS (A4C):   -24.1 % 2D Strain GLS Avg:     -22.4 %  3D Volume EF: 3D EF:        59 % LV EDV:       78 ml LV ESV:       32 ml LV SV:        46 ml  RIGHT VENTRICLE             IVC RV Basal diam:  3.10 cm     IVC diam: 1.40 cm RV S prime:     13.00 cm/s TAPSE (M-mode): 2.4 cm RVSP:           41.7 mmHg  LEFT ATRIUM           Index        RIGHT ATRIUM           Index LA diam:      2.70 cm 1.63 cm/m   RA Pressure: 3.00 mmHg LA Vol  (A2C): 37.1 ml 22.34 ml/m  RA Area:     15.60 cm LA Vol (A4C): 70.0 ml 42.15 ml/m  RA Volume:   40.00 ml  24.09 ml/m AORTIC VALVE LVOT Vmax:   79.00 cm/s LVOT Vmean:  55.300 cm/s LVOT VTI:    0.184 m  AORTA Ao Root diam: 3.00 cm Ao Asc diam:  3.00 cm  MITRAL VALVE               TRICUSPID VALVE TR Peak grad:   38.7 mmHg MV Decel Time: 183 msec    TR Vmax:        311.00 cm/s MV E velocity: 97.60 cm/s  Estimated RAP:  3.00 mmHg MV A velocity: 68.80 cm/s  RVSP:           41.7 mmHg MV E/A ratio:  1.42 SHUNTS Systemic VTI:  0.18 m Systemic Diam: 2.00 cm  Dinah Franco MD Electronically signed by Dinah Franco MD Signature Date/Time: 02/26/2022/11:30:54 AM    Final          ______________________________________________________________________________________________       Risk Assessment/Calculations:     HYPERTENSION CONTROL Vitals:   04/09/24 0914 04/09/24 0952  BP: (!) 152/60 (!) 160/60    The patient's blood pressure is elevated above target today.  In order to address the patient's elevated BP: The blood pressure is usually elevated in clinic.  Blood pressures monitored at home have been optimal.          Physical Exam:   VS:  BP (!) 160/60   Pulse 71   Ht 5\' 1"  (1.549 m)   Wt 130 lb (59 kg)   SpO2 97%   BMI 24.56 kg/m    Wt Readings from Last 3 Encounters:  04/09/24 130 lb (59 kg)  10/03/23 129 lb (58.5 kg)  06/03/23 142 lb (64.4 kg)  GEN: Well nourished, well developed in no acute distress. Sitting comfortably in the chair  NECK: No JVD; No carotid bruits CARDIAC:  RRR. Faint systolic murmur. Radial pulses 2+ bilaterally  RESPIRATORY:  Clear to auscultation without rales, wheezing or rhonchi. Normal WOB on room air  ABDOMEN: Soft, non-tender, non-distended EXTREMITIES:  No edema in BLE; No deformity   ASSESSMENT AND PLAN: .    HTN  -Currently on lisinopril  40 mg daily, atenolol  50 mg daily - Patient checks her BP twice a day at home.  BP in the 120s/70s consistently. Is often elevated during appointments. Possible white coat HTN. With her advanced age and reportedly well controlled BP at home, I am hesitant to increase BP medications at this time for fear of causing orthostatic hypotension  - Instructed patient to keep a BP log for 2 weeks , checking her BP twice daily  - Discussed low sodium diets and encouraged her to continue to increase physical activity as tolerated  - Continue current medications  - Labs are followed by PCP ([facility]). Reports that at her physical in 02/2024, her lab work was normal    PSVT/AVNRT  PACs, PVCs - Patient denies any palpitations or fluttering in her chest  - Echo from 02/2022 showed EF 60-65%, no wall motion abnormalities, normal RV function  - Asymptomatic  -Continue atenolol  50 mg daily   Valvular Heart Disease  -Most recent echocardiogram from 02/2022 showed EF 60 to 65%, no regional wall motion abnormalities, normal RV function, mild-moderate mitral valve regurgitation, moderate-severe tricuspid valve regurgitation - Patient euvolemic on exam. Denies shortness of breath, ankle edema, orthopnea  - Ordered echocardiogram for monitoring    HLD  - Labs followed by PCP. Reports her cholesterol was well controlled in 02/2024  - Continue simvastatin  40 mg daily   Dispo: Follow up in 6 months with Dr. Maximo Spar   Signed, Debria Fang, PA-C

## 2024-04-09 ENCOUNTER — Encounter: Payer: Self-pay | Admitting: Cardiology

## 2024-04-09 ENCOUNTER — Ambulatory Visit: Attending: Cardiology | Admitting: Cardiology

## 2024-04-09 VITALS — BP 160/60 | HR 71 | Ht 61.0 in | Wt 130.0 lb

## 2024-04-09 DIAGNOSIS — E785 Hyperlipidemia, unspecified: Secondary | ICD-10-CM | POA: Diagnosis not present

## 2024-04-09 DIAGNOSIS — I340 Nonrheumatic mitral (valve) insufficiency: Secondary | ICD-10-CM

## 2024-04-09 DIAGNOSIS — I071 Rheumatic tricuspid insufficiency: Secondary | ICD-10-CM

## 2024-04-09 DIAGNOSIS — I491 Atrial premature depolarization: Secondary | ICD-10-CM

## 2024-04-09 DIAGNOSIS — I10 Essential (primary) hypertension: Secondary | ICD-10-CM | POA: Diagnosis not present

## 2024-04-09 DIAGNOSIS — I471 Supraventricular tachycardia, unspecified: Secondary | ICD-10-CM

## 2024-04-09 DIAGNOSIS — I493 Ventricular premature depolarization: Secondary | ICD-10-CM | POA: Diagnosis not present

## 2024-04-09 NOTE — Patient Instructions (Signed)
 Medication Instructions:  NO CHANGES  *If you need a refill on your cardiac medications before your next appointment, please call your pharmacy*  Testing/Procedures: Your physician has requested that you have an echocardiogram. Echocardiography is a painless test that uses sound waves to create images of your heart. It provides your doctor with information about the size and shape of your heart and how well your heart's chambers and valves are working. This procedure takes approximately one hour. There are no restrictions for this procedure. Please do NOT wear cologne, perfume, aftershave, or lotions (deodorant is allowed). Please arrive 15 minutes prior to your appointment time.  Please note: We ask at that you not bring children with you during ultrasound (echo/ vascular) testing. Due to room size and safety concerns, children are not allowed in the ultrasound rooms during exams. Our front office staff cannot provide observation of children in our lobby area while testing is being conducted. An adult accompanying a patient to their appointment will only be allowed in the ultrasound room at the discretion of the ultrasound technician under special circumstances. We apologize for any inconvenience.    Follow-Up: At [facility], you and your health needs are our priority.  As part of our continuing mission to provide you with exceptional heart care, our providers are all part of one team.  This team includes your primary Cardiologist (physician) and Advanced Practice Providers or APPs (Physician Assistants and Nurse Practitioners) who all work together to provide you with the care you need, when you need it.  Your next appointment:    6 months with Dr. Maximo Spar  We recommend signing up for the patient portal called "MyChart".  Sign up information is provided on this After Visit Summary.  MyChart is used to connect with patients for Virtual Visits (Telemedicine).  Patients are able to view  lab/test results, encounter notes, upcoming appointments, etc.  Non-urgent messages can be sent to your provider as well.   To learn more about what you can do with MyChart, go to [email or web address].   Other Instructions  Raeanne Bull PA has advised that you check your BP at home about ONE HOUR after your BP meds and before bedtime.   Please call or drop off your list of BP readings after TWO WEEKS  HOW TO TAKE YOUR BLOOD PRESSURE: Rest 5 minutes before taking your blood pressure. Don't smoke or drink caffeinated beverages for at least 30 minutes before. Take your blood pressure before (not after) you eat. Sit comfortably with your back supported and both feet on the floor (don't cross your legs). Elevate your arm to heart level on a table or a desk. Use the proper sized cuff. It should fit smoothly and snugly around your bare upper arm. There should be enough room to slip a fingertip under the cuff. The bottom edge of the cuff should be 1 inch above the crease of the elbow. Ideally, take 3 measurements at one sitting and record the average.        1st Floor: - Lobby - Registration  - Pharmacy  - Lab - Cafe  2nd Floor: - PV Lab - Diagnostic Testing (echo, CT, nuclear med)  3rd Floor: - Vacant  4th Floor: - TCTS (cardiothoracic surgery) - AFib Clinic - Structural Heart Clinic - Vascular Surgery  - Vascular Ultrasound  5th Floor: - HeartCare Cardiology (general and EP) - Clinical Pharmacy for coumadin, hypertension, lipid, weight-loss medications, and med management appointments    Valet parking services will be  available as well.

## 2024-04-13 ENCOUNTER — Ambulatory Visit (HOSPITAL_COMMUNITY): Attending: Cardiology

## 2024-04-13 DIAGNOSIS — I340 Nonrheumatic mitral (valve) insufficiency: Secondary | ICD-10-CM | POA: Diagnosis not present

## 2024-04-13 LAB — ECHOCARDIOGRAM COMPLETE: S' Lateral: 2.31 cm

## 2024-04-17 ENCOUNTER — Telehealth: Payer: Self-pay

## 2024-04-17 NOTE — Telephone Encounter (Signed)
-----   Message from Debria Fang sent at 04/14/2024  4:30 PM EDT ----- Please tell patient that her echocardiogram showed EF (pumping strength of the heart) was normal at 60-65%, RV function normal. There was mild- moderate mitral valve regurgitation (leakiness) which is stable compared to echocardiogram from 2023). Tricuspid valve regurgitation (leakiness) is now moderate, was previously moderate-severe in 2023.   Overall, no significant changes when compared to previous study. No changes to treatment plan   Thanks KJ

## 2024-04-17 NOTE — Telephone Encounter (Signed)
 Called patient advised of below they verbalized understanding No questions at this time.

## 2024-04-30 ENCOUNTER — Telehealth: Payer: Self-pay | Admitting: Internal Medicine

## 2024-04-30 NOTE — Telephone Encounter (Signed)
 Patient brought in BP reading. Will leave in provider box.

## 2024-05-01 ENCOUNTER — Encounter: Payer: Self-pay | Admitting: Internal Medicine

## 2024-05-05 NOTE — Telephone Encounter (Signed)
 Patient called with advice from MD. She said her BP is higher in AM, asked if correlated to taking allergy pill in evening (no -D or -DM). She takes lisinopril  QAM. Explained that her BP is likely higher in AM as the lisinopril  is wearing off/out of her system then. Advised she could check her BP about 2 hours after her AM lisinopril  to best see how effective the med is in lowering her BP. Reassured her no changes to meds were recommended based on BP readings, per MD

## 2024-05-05 NOTE — Telephone Encounter (Signed)
 BP readings reviewed by MD. He sent a MyChart message, however patient has not reviewed (last login 12/2022).

## 2024-05-14 ENCOUNTER — Telehealth: Payer: Self-pay | Admitting: Orthopaedic Surgery

## 2024-05-14 ENCOUNTER — Other Ambulatory Visit: Payer: Self-pay

## 2024-05-14 MED ORDER — AMOXICILLIN 500 MG PO TABS: ORAL_TABLET | ORAL | 2 refills | Status: AC

## 2024-05-14 NOTE — Telephone Encounter (Signed)
 Medication has been sent to pharmacy. Tried to call patient. No answer and no voicemail.

## 2024-05-14 NOTE — Telephone Encounter (Signed)
 Patient called and said she is going to get some treatment done at the dentist and needs some Antibiotics called in. CB#[phone]

## 2024-05-26 ENCOUNTER — Other Ambulatory Visit (INDEPENDENT_AMBULATORY_CARE_PROVIDER_SITE_OTHER): Payer: Self-pay

## 2024-05-26 ENCOUNTER — Ambulatory Visit: Payer: Medicare PPO | Admitting: Orthopaedic Surgery

## 2024-05-26 DIAGNOSIS — T84010A Broken internal right hip prosthesis, initial encounter: Secondary | ICD-10-CM | POA: Diagnosis not present

## 2024-05-26 NOTE — Progress Notes (Signed)
 Post-Op Visit Note   Patient: [name]           Date of Birth: 07/20/1933           MRN: [id number] Visit Date: 05/26/2024 PCP: Elester Grim, MD   Assessment & Plan:  Chief Complaint:  Chief Complaint  Patient presents with   Right Hip - Follow-up    Right THR 06/03/2023   Visit Diagnoses:  1. 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  She experiences intermittent hip pain primarily during activity or after overexertion. The pain is localized to the hip area and is effectively managed with Tylenol . She is one year post-surgery and has undergone multiple surgeries on the hip in the past. There is no constant pain, and no pain occurs during hip rotation and flexion.  Physical Exam MUSCULOSKELETAL: Hip scar is well-healed.  Fluid painless range of motion.  Results RADIOLOGY Hip X-ray: Implant stable, no evidence of migration (05/26/2024)  Assessment and Plan Patient is 1 year status post revision of acetabular component for metal-on-metal bearing.  She is doing well overall. - Continue acetaminophen  as needed. - Schedule follow-up in six months with repeat x-rays of the pelvis.  Follow-Up Instructions: Return in about 6 months (around 11/25/2024).   Orders:  Orders Placed This Encounter  Procedures   XR Pelvis 1-2 Views   No orders of the defined types were placed in this encounter.   Imaging: XR Pelvis 1-2 Views Result Date: 05/26/2024 X-rays of the pelvis show stable total hip replacement without complications.  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   Tonsil asymmetry 11/03/2019   Abnormal chest x-ray 08/12/2019   Encounter for health maintenance examination in adult 08/12/2019   Need for influenza vaccination 08/12/2019   Chest wall pain 06/11/2019   Muscle strain 06/11/2019   Abnormal EKG 06/11/2019   Murmur 09/05/2018   Aortic atherosclerosis (HCC) 08/04/2018   Elevated ferritin 08/04/2018   Other fatigue 07/14/2018   Iron disorder 07/14/2018   Primary open angle glaucoma of both eyes, mild stage 10/14/2017   Glaucoma 07/11/2017   Vaccine counseling 07/11/2017   Varicose vein of leg 07/11/2017   Need for shingles vaccine 07/11/2017   Hearing loss 06/27/2016   Medicare annual wellness visit, subsequent 06/26/2016   Osteoporosis 01/12/2016   Dyslipidemia 06/14/2015   Essential hypertension 06/14/2015   Mitral valve regurgitation 06/14/2015   Tricuspid valve insufficiency 06/14/2015   Vitamin D  deficiency 06/14/2015   Rhinitis, allergic 06/14/2015   Renal insufficiency 06/14/2015   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 Gennie [name]   Complication of anesthesia  Diverticulosis    per 2010 colonoscopy   Dyslipidemia    Full dentures    GERD (gastroesophageal reflux disease)    hx/o, resolved   Glaucoma 2016   H/O echocardiogram 06/22/2010   normal LVEF, moderate mitral and tricuspid regurg 07/2016 Dr. Maximo [name];  mild aortic valve stenosis, left ventricular normal, EF 55%; Dr. Katheryne [name]  06/2010   History of cardiovascular stress test 06/22/2010   normal bruce myocadial perfusion study, 72% EF; Dr. Katheryne [name]    History of mammogram    benign bilat calcifications, heterogeneously dense breasts, stable mammograms 2009-2014   History of pneumonia    walking pneumonia "years ago"   History of uterine cancer 12/2002   s/p TAH and BSO, pelvic and periarotic lymphadenectomy 12/2002, no adjuvant therapy; stage 1b carcinosarcoma of uterus, completed 5 years of f/u as of 2008; Dr. Valeen [name] gyencology oncology [place]; Dr. Alric [name] gynecology   Hypertension    Lactose intolerance    Lown Georgana Kilts syndrome    short PR interval with increased conduction across AV node; Dr. Ed Gondola   Osteoporosis    PONV (postoperative nausea and vomiting)    SVT (supraventricular tachycardia) (HCC)    asymptomatic 2014, hx/o SVT, Dr. Ed Gondola   Vitamin D  deficiency     Family History  Family history unknown: Yes    Past Surgical History:  Procedure Laterality Date   ABDOMINAL HYSTERECTOMY Bilateral    h/o uterine cancer; oophrectomy   CATARACT EXTRACTION     bilat   COLONOSCOPY  08/2016   08/2016  Dr. Nickey Barn advised no repeat colonoscopy;  scattered diverticla, medium hemorrhoids 2010; Dr. Nickey Barn   JOINT REPLACEMENT     NM MYOCAR PERF WALL MOTION     THROAT SURGERY     some tissue removed from throat at [facility]   TONSILLECTOMY Right 12/28/2019   Procedure: TONSILLECTOMY;  Surgeon: Emma Grills, MD;  Location: [facility];  Service: ENT;  Laterality: Right;   TOTAL HIP ARTHROPLASTY Bilateral 2008, 2010    twice left (complication on the left, once on the right; Dr. Lynita [name]   TOTAL HIP REVISION Right 06/03/2023   Procedure: RIGHT TOTAL HIP ARTHROPLASTY REVISION, POSTERIOR;  Surgeon: Emma Hamman, MD;  Location: [facility] OR;  Service: Orthopedics;  Laterality: Right;  3-C   TRANSTHORACIC ECHOCARDIOGRAM  02/2013   ordered for SVT; EF 55-65%; calcified MV annulus, mod MR; RSVP increased; RA mildly dilated; mod TR; PA peak pressure   Social History   Occupational History    Not on file  Tobacco Use   Smoking status: Never   Smokeless tobacco: Never  Vaping Use   Vaping status: Never Used  Substance and Sexual Activity   Alcohol use: No   Drug use: No   Sexual activity: Never    Birth control/protection: Surgical    Comment: walks 3x/wk, retired, married, 2 grandchildren

## 2024-06-15 DIAGNOSIS — Z8542 Personal history of malignant neoplasm of other parts of uterus: Secondary | ICD-10-CM | POA: Diagnosis not present

## 2024-06-15 DIAGNOSIS — E785 Hyperlipidemia, unspecified: Secondary | ICD-10-CM | POA: Diagnosis not present

## 2024-06-15 DIAGNOSIS — M810 Age-related osteoporosis without current pathological fracture: Secondary | ICD-10-CM | POA: Diagnosis not present

## 2024-06-15 DIAGNOSIS — H401130 Primary open-angle glaucoma, bilateral, stage unspecified: Secondary | ICD-10-CM | POA: Diagnosis not present

## 2024-06-25 DIAGNOSIS — H401122 Primary open-angle glaucoma, left eye, moderate stage: Secondary | ICD-10-CM | POA: Diagnosis not present

## 2024-06-25 DIAGNOSIS — H401113 Primary open-angle glaucoma, right eye, severe stage: Secondary | ICD-10-CM | POA: Diagnosis not present

## 2024-07-16 DIAGNOSIS — M810 Age-related osteoporosis without current pathological fracture: Secondary | ICD-10-CM | POA: Diagnosis not present

## 2024-07-16 DIAGNOSIS — H401130 Primary open-angle glaucoma, bilateral, stage unspecified: Secondary | ICD-10-CM | POA: Diagnosis not present

## 2024-07-16 DIAGNOSIS — Z8542 Personal history of malignant neoplasm of other parts of uterus: Secondary | ICD-10-CM | POA: Diagnosis not present

## 2024-07-16 DIAGNOSIS — E785 Hyperlipidemia, unspecified: Secondary | ICD-10-CM | POA: Diagnosis not present

## 2024-07-17 ENCOUNTER — Ambulatory Visit: Admitting: Podiatry

## 2024-07-17 DIAGNOSIS — L84 Corns and callosities: Secondary | ICD-10-CM | POA: Diagnosis not present

## 2024-07-17 NOTE — Progress Notes (Signed)
 Subjective:  Patient ID: [name], female    DOB: February 23, 1933,  MRN: [id number]  Chief Complaint  Patient presents with   Toe Pain    89 y.o. female presents with the above complaint.  Patient presents with left hallux and second digit heloma molle in the interdigital space.  Patient states is painful to touch has progressed gotten worse worse with ambulation worse with pressure she has tried some padding cushioning none of which has helped.  She has not tried any shoe gear modification.  Wanted to discuss treatment options for that.   Review of Systems: Negative except as noted in the HPI. Denies N/V/F/Ch.  Past Medical History:  Diagnosis Date   Allergies    Allergy    Anxiety    Arthritis    Atrophic vaginitis    Cancer (HCC)    throat cancer   Cataract    hx/o surgery, both eyes; Dr. Roz   Complication of anesthesia    Diverticulosis    per 2010 colonoscopy   Dyslipidemia    Full dentures    GERD (gastroesophageal reflux disease)    hx/o, resolved   Glaucoma 2016   H/O echocardiogram 06/22/2010   normal LVEF, moderate mitral and tricuspid regurg 07/2016 Dr. Mona;  mild aortic valve stenosis, left ventricular normal, EF 55%; Dr. Court  06/2010   History of cardiovascular stress test 06/22/2010   normal bruce myocadial perfusion study, 72% EF; Dr. Court   History of mammogram    benign bilat calcifications, heterogeneously dense breasts, stable mammograms 2009-2014   History of pneumonia    walking pneumonia years ago   History of uterine cancer 12/2002   s/p TAH and BSO, pelvic and periarotic lymphadenectomy 12/2002, no adjuvant therapy; stage 1b carcinosarcoma of uterus, completed 5 years of f/u as of 2008; Dr. Toribio [facility] onc[facility] [place]; Dr. Lynwood Ku gynecology   Hypertension    Lactose intolerance    Lown Dane Dan syndrome    short PR interval with increased conduction across AV node; Dr. Maye   Osteoporosis     PONV (postoperative nausea and vomiting)    SVT (supraventricular tachycardia) (HCC)    asymptomatic 2014, hx/o SVT, Dr. Maye   Vitamin D  deficiency     Current Outpatient Medications:    amoxicillin  (AMOXIL ) 500 MG tablet, Take 4 tablets by mouth 1 hour prior to dental procedure., Disp: 4 tablet, Rfl: 2   alendronate  (FOSAMAX ) 70 MG tablet, SMARTSIG:1 Tablet(s) By Mouth, Disp: , Rfl:    atenolol  (TENORMIN ) 50 MG tablet, TAKE 1 TABLET BY MOUTH EVERY DAY (Patient taking differently: Take 50 mg by mouth at bedtime.), Disp: 90 tablet, Rfl: 3   brimonidine (ALPHAGAN P) 0.1 % SOLN, Place 1 drop into both eyes in the morning and at bedtime., Disp: , Rfl:    Cholecalciferol  (VITAMIN D3 PO), Take 1,000 Units by mouth in the morning., Disp: , Rfl:    latanoprost (XALATAN) 0.005 % ophthalmic solution, Place 1 drop into both eyes at bedtime., Disp: , Rfl:    levocetirizine (XYZAL) 5 MG tablet, Take 5 mg by mouth every evening., Disp: , Rfl:    lisinopril  (ZESTRIL ) 40 MG tablet, Take 1 tablet (40 mg total) by mouth daily., Disp: 90 tablet, Rfl: 3   ondansetron  (ZOFRAN ) 4 MG tablet, Take 1 tablet (4 mg total) by mouth every 8 (eight) hours as needed for nausea or vomiting., Disp: 40 tablet, Rfl: 0   OVER THE COUNTER MEDICATION, Take 1,000  mg by mouth in the morning. [place], Disp: , Rfl:    simvastatin  (ZOCOR ) 40 MG tablet, TAKE 1 TABLET BY MOUTH EVERYDAY AT BEDTIME, Disp: 90 tablet, Rfl: 3  Social History   Tobacco Use  Smoking Status Never  Smokeless Tobacco Never    Allergies  Allergen Reactions   Other Nausea Only    medicines with steroids in it make me nauseous   Prednisone Nausea And Vomiting   Objective:  There were no vitals filed for this visit. There is no height or weight on file to calculate BMI. Constitutional Well developed. Well nourished.  Vascular Dorsalis pedis pulses palpable bilaterally. Posterior tibial pulses palpable bilaterally. Capillary  refill normal to all digits.  No cyanosis or clubbing noted. Pedal hair growth normal.  Neurologic Normal speech. Oriented to person, place, and time. Epicritic sensation to light touch grossly present bilaterally.  Dermatologic Left second digit first digit heloma molleno open wounds or lesion noted.  Hammertoe contracture and bunion deformity noted moderate in nature.  Orthopedic: Normal joint ROM without pain or crepitus bilaterally. No visible deformities. No bony tenderness.   Radiographs: None Assessment:   1. Heloma molle    Plan:  Patient was evaluated and treated and all questions answered.  Left 1st and 2nd digit heloma molle - All questions and concerns were discussed with the patient in extensive detail at this time patient would benefit from a spacer.  I discussed this with the patient she states understanding like to proceed with that.  Spacers were given.  If there is no improvement we will discuss surgical options - Shoe gear modification discussed  No follow-ups on file.

## 2024-08-06 DIAGNOSIS — H43813 Vitreous degeneration, bilateral: Secondary | ICD-10-CM | POA: Diagnosis not present

## 2024-08-06 DIAGNOSIS — H401113 Primary open-angle glaucoma, right eye, severe stage: Secondary | ICD-10-CM | POA: Diagnosis not present

## 2024-08-06 DIAGNOSIS — H401122 Primary open-angle glaucoma, left eye, moderate stage: Secondary | ICD-10-CM | POA: Diagnosis not present

## 2024-08-16 DIAGNOSIS — E785 Hyperlipidemia, unspecified: Secondary | ICD-10-CM | POA: Diagnosis not present

## 2024-08-16 DIAGNOSIS — M810 Age-related osteoporosis without current pathological fracture: Secondary | ICD-10-CM | POA: Diagnosis not present

## 2024-08-16 DIAGNOSIS — H401130 Primary open-angle glaucoma, bilateral, stage unspecified: Secondary | ICD-10-CM | POA: Diagnosis not present

## 2024-08-16 DIAGNOSIS — Z8542 Personal history of malignant neoplasm of other parts of uterus: Secondary | ICD-10-CM | POA: Diagnosis not present

## 2024-09-04 DIAGNOSIS — H401113 Primary open-angle glaucoma, right eye, severe stage: Secondary | ICD-10-CM | POA: Diagnosis not present

## 2024-09-04 DIAGNOSIS — H401122 Primary open-angle glaucoma, left eye, moderate stage: Secondary | ICD-10-CM | POA: Diagnosis not present

## 2024-09-23 DIAGNOSIS — H401122 Primary open-angle glaucoma, left eye, moderate stage: Secondary | ICD-10-CM | POA: Diagnosis not present

## 2024-09-23 DIAGNOSIS — H43813 Vitreous degeneration, bilateral: Secondary | ICD-10-CM | POA: Diagnosis not present

## 2024-09-23 DIAGNOSIS — H401113 Primary open-angle glaucoma, right eye, severe stage: Secondary | ICD-10-CM | POA: Diagnosis not present

## 2024-09-30 ENCOUNTER — Ambulatory Visit: Admitting: Orthopedic Surgery

## 2024-09-30 ENCOUNTER — Encounter: Payer: Self-pay | Admitting: Orthopedic Surgery

## 2024-09-30 ENCOUNTER — Other Ambulatory Visit: Payer: Self-pay

## 2024-09-30 DIAGNOSIS — M5416 Radiculopathy, lumbar region: Secondary | ICD-10-CM

## 2024-09-30 DIAGNOSIS — E785 Hyperlipidemia, unspecified: Secondary | ICD-10-CM | POA: Diagnosis not present

## 2024-09-30 DIAGNOSIS — N1831 Chronic kidney disease, stage 3a: Secondary | ICD-10-CM | POA: Diagnosis not present

## 2024-09-30 DIAGNOSIS — I272 Pulmonary hypertension, unspecified: Secondary | ICD-10-CM | POA: Diagnosis not present

## 2024-09-30 DIAGNOSIS — Z0000 Encounter for general adult medical examination without abnormal findings: Secondary | ICD-10-CM | POA: Diagnosis not present

## 2024-09-30 DIAGNOSIS — I10 Essential (primary) hypertension: Secondary | ICD-10-CM | POA: Diagnosis not present

## 2024-09-30 DIAGNOSIS — I5189 Other ill-defined heart diseases: Secondary | ICD-10-CM | POA: Diagnosis not present

## 2024-09-30 DIAGNOSIS — R7303 Prediabetes: Secondary | ICD-10-CM | POA: Diagnosis not present

## 2024-09-30 DIAGNOSIS — I739 Peripheral vascular disease, unspecified: Secondary | ICD-10-CM | POA: Diagnosis not present

## 2024-09-30 DIAGNOSIS — M810 Age-related osteoporosis without current pathological fracture: Secondary | ICD-10-CM | POA: Diagnosis not present

## 2024-09-30 NOTE — Progress Notes (Signed)
 Office Visit Note   Patient: [name]           Date of Birth: [birth year]           MRN: [id number] Visit Date: 09/30/2024 Requested by: Vernon Velna JONELLE, MD [address] [place],  [place] [address] PCP: Vernon Velna JONELLE, MD  Subjective: Chief Complaint  Patient presents with   Lower Back - Pain    HPI: [name] is a 89 y.o. female who presents to the office reporting low back pain without radiculopathy.  Staying around the L4-5 region.  Last epidural steroid injection was a year and a half ago which did help.  She denies any fevers or chills.  Takes Tylenol  and does not take any anticoagulants.  MRI scan does show some mildly to moderately compressive pathology but does not think there is anything operative..                ROS: All systems reviewed are negative as they relate to the chief complaint within the history of present illness.  Patient denies fevers or chills.  Assessment & Plan: Visit Diagnoses:  1. Lumbar radiculopathy     Plan: Impression is low back pain with facet arthritis mediated symptoms based on exam.  No radicular symptoms.  Injection helped before.  Injection indicated again.  She will follow-up with us  as needed.  Follow-Up Instructions: No follow-ups on file.   Orders:  No orders of the defined types were placed in this encounter.  No orders of the defined types were placed in this encounter.     Procedures: No procedures performed   Clinical Data: No additional findings.  Objective: Vital Signs: There were no vitals taken for this visit.  Physical Exam:  Constitutional: Patient appears well-developed HEENT:  Head: Normocephalic Eyes:EOM are normal Neck: Normal range of motion Cardiovascular: Normal rate Pulmonary/chest: Effort normal Neurologic: Patient is alert Skin: Skin is warm Psychiatric: Patient has normal mood and affect  Ortho Exam: Ortho exam demonstrates normal gait and alignment with no  nerve root tension signs.  Ankle dorsiflexion plantarflexion strength is intact.  Does have a little bit of pain localizing to that L4-5 region.  No SI joint tenderness.  Pedal pulses palpable.  No muscle atrophy in either leg.  Specialty Comments:  EXAM: MRI LUMBAR SPINE WITHOUT CONTRAST   TECHNIQUE: Multiplanar, multisequence MR imaging of the lumbar spine was performed. No intravenous contrast was administered.   COMPARISON:  Lumbar spine MRI 10/25/2018.   FINDINGS: Segmentation: Conventional numbering is assumed with 5 non-rib-bearing, lumbar type vertebral bodies.   Alignment: Levo scoliotic curvature of the lumbar spine. Unchanged grade 1 anterolisthesis of L3 on L4 with trace retrolisthesis of L2 on L3.   Vertebrae: Heterogeneous marrow signal. Modic type 1 degenerative endplate marrow signal changes at L4-5, unchanged.   Conus medullaris and cauda equina: Conus extends to the L2-3 level. Conus and cauda equina appear normal.   Paraspinal and other soft tissues: Fatty atrophy of the paraspinal muscles.   Disc levels:   T12-L1: Disc bulge and facet arthropathy results in mild spinal canal stenosis, new from prior. No neural foraminal narrowing.   L1-L2: Small disc bulge and endplate osteophytes without significant spinal canal stenosis. Facet arthropathy contributes to moderate right neural foraminal narrowing, unchanged.   L2-L3: Small disc osteophyte complex without significant spinal canal stenosis. Moderate disc height loss and facet arthropathy results in severe right and moderate left neural foraminal narrowing, unchanged.  L3-L4: Right eccentric disc bulge and facet arthropathy results in mild spinal canal stenosis and moderate right neural foraminal narrowing, unchanged.   L4-L5: Left eccentric disc bulge and facet arthropathy results in compression of the traversing left L5 nerve root in the lateral recess, worse from prior. Unchanged mild bilateral  neural foraminal narrowing.   L5-S1: Left eccentric disc bulge and facet arthropathy results in compression of the traversing left S1 nerve root in the lateral recess, worse from prior. Unchanged mild left neural foraminal narrowing.   IMPRESSION: 1. Slight progression of lumbar spondylosis with new compression of the traversing left L5 and S1 nerve roots in the lateral recesses at L4-5 and L5-S1, respectively. 2. New mild spinal canal stenosis at T12-L1. 3. Otherwise unchanged moderate to severe neural foraminal narrowing at multiple levels, as described above.     Electronically Signed   By: Ryan Chess M.D.   On: 02/19/2023 09:14  Imaging: No results found.   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   Tonsil asymmetry 11/03/2019   Abnormal chest x-ray 08/12/2019   Encounter for health maintenance examination in adult 08/12/2019   Need for influenza vaccination 08/12/2019   Chest wall pain 06/11/2019   Muscle strain 06/11/2019   Abnormal EKG 06/11/2019   Murmur 09/05/2018   Aortic atherosclerosis 08/04/2018   Elevated ferritin 08/04/2018   Other fatigue 07/14/2018   Iron disorder 07/14/2018   Primary open angle glaucoma of both eyes, mild stage 10/14/2017   Glaucoma 07/11/2017   Vaccine counseling 07/11/2017   Varicose vein of leg 07/11/2017   Need for shingles vaccine 07/11/2017   Hearing loss 06/27/2016   Medicare annual wellness visit, subsequent 06/26/2016   Osteoporosis 01/12/2016   Dyslipidemia 06/14/2015   Essential hypertension 06/14/2015   Mitral valve regurgitation 06/14/2015   Tricuspid valve insufficiency 06/14/2015   Vitamin D  deficiency 06/14/2015   Rhinitis, allergic 06/14/2015   Renal insufficiency  06/14/2015   Disorders of both mitral and tricuspid valves 03/04/2015   Lown Dane Dan syndrome 03/23/2014   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 Roz   Complication of anesthesia    Diverticulosis    per 2010 colonoscopy   Dyslipidemia    Full dentures    GERD (gastroesophageal reflux disease)    hx/o, resolved   Glaucoma 2016   H/O echocardiogram 06/22/2010   normal LVEF, moderate mitral and tricuspid regurg 07/2016 Dr. Mona;  mild aortic valve stenosis, left ventricular normal, EF 55%; Dr. Court  06/2010   History of cardiovascular stress test 06/22/2010   normal bruce myocadial perfusion study, 72% EF; Dr. Court   History of mammogram    benign bilat calcifications, heterogeneously dense breasts, stable mammograms 2009-2014   History of pneumonia    walking pneumonia years ago   History of uterine cancer 12/2002   s/p TAH and BSO, pelvic and periarotic lymphadenectomy 12/2002, no adjuvant therapy; stage 1b carcinosarcoma of uterus, completed 5 years of f/u as of 2008; Dr. Toribio  [facility] [place]; Dr. Lynwood Ku gynecology   Hypertension    Lactose intolerance    Lown Dane Dan syndrome    short PR interval with increased conduction across AV node; Dr. Maye   Osteoporosis    PONV (postoperative nausea and vomiting)    SVT (supraventricular tachycardia)    asymptomatic 2014, hx/o SVT, Dr. Maye   Vitamin D  deficiency     Family History  Family history unknown: Yes    Past Surgical History:  Procedure Laterality Date   ABDOMINAL HYSTERECTOMY Bilateral    h/o uterine cancer; oophrectomy   CATARACT EXTRACTION     bilat   COLONOSCOPY  08/2016   08/2016  Dr. Rollin advised no repeat colonoscopy;  scattered diverticla,  medium hemorrhoids 2010; Dr. Rollin   JOINT REPLACEMENT     NM MYOCAR PERF WALL MOTION     THROAT SURGERY     some tissue removed from throat at [facility]   TONSILLECTOMY Right 12/28/2019   Procedure: TONSILLECTOMY;  Surgeon: Carlie Clark, MD;  Location: [facility];  Service: ENT;  Laterality: Right;   TOTAL HIP ARTHROPLASTY Bilateral 2008, 2010    twice left (complication on the left, once on the right; Dr. Glendia Hutchinson   TOTAL HIP REVISION Right 06/03/2023   Procedure: RIGHT TOTAL HIP ARTHROPLASTY REVISION, POSTERIOR;  Surgeon: Jerri Kay HERO, MD;  Location: [facility] OR;  Service: Orthopedics;  Laterality: Right;  3-C   TRANSTHORACIC ECHOCARDIOGRAM  02/2013   ordered for SVT; EF 55-65%; calcified MV annulus, mod MR; RSVP increased; RA mildly dilated; mod TR; PA peak pressure   Social History   Occupational History   Not on file  Tobacco Use   Smoking status: Never   Smokeless tobacco: Never  Vaping Use   Vaping status: Never Used  Substance and Sexual Activity   Alcohol use: No   Drug use: No   Sexual activity: Never    Birth control/protection: Surgical    Comment: walks 3x/wk, retired, married, 2 grandchildren

## 2024-10-19 ENCOUNTER — Encounter: Payer: Self-pay | Admitting: Radiology

## 2024-10-27 ENCOUNTER — Ambulatory Visit: Admitting: Physical Medicine and Rehabilitation

## 2024-10-27 ENCOUNTER — Other Ambulatory Visit: Payer: Self-pay

## 2024-10-27 VITALS — BP 159/70 | HR 68

## 2024-10-27 DIAGNOSIS — M5416 Radiculopathy, lumbar region: Secondary | ICD-10-CM

## 2024-10-27 MED ORDER — METHYLPREDNISOLONE ACETATE 40 MG/ML IJ SUSP
40.0000 mg | Freq: Once | INTRAMUSCULAR | Status: AC
Start: 2024-10-27 — End: 2024-10-27
  Administered 2024-10-27: 40 mg

## 2024-10-27 NOTE — Progress Notes (Signed)
 Pain Scale   Average Pain 4 Patient advising she has lower back pain that increases when getting up from sitting down. Patient advising her pain decreases when walking        +Driver, -BT, -Dye Allergies.

## 2024-11-03 NOTE — Progress Notes (Signed)
 [name] - 89 y.o. female MRN [id number]  Date of birth: April 15, 1933  Office Visit Note: Visit Date: 10/27/2024 PCP: Vernon Velna JONELLE, MD Referred by: Vernon Velna JONELLE, MD  Subjective: Chief Complaint  Patient presents with   Lower Back - Pain   HPI:  [name] is a 89 y.o. female who comes in today at the request of Dr. JUDITHANN Glendia Hutchinson for planned Left L5-S1 Lumbar Interlaminar epidural steroid injection with fluoroscopic guidance.  The patient has failed conservative care including home exercise, medications, time and activity modification.  This injection will be diagnostic and hopefully therapeutic.  Please see requesting physician notes for further details and justification.   ROS Otherwise per HPI.  Assessment & Plan: Visit Diagnoses:    ICD-10-CM   1. Lumbar radiculopathy  M54.16 XR C-ARM NO REPORT    Epidural Steroid injection    methylPREDNISolone  acetate (DEPO-MEDROL ) injection 40 mg      Plan: No additional findings.   Meds & Orders:  Meds ordered this encounter  Medications   methylPREDNISolone  acetate (DEPO-MEDROL ) injection 40 mg    Orders Placed This Encounter  Procedures   XR C-ARM NO REPORT   Epidural Steroid injection    Follow-up: Return for visit to requesting provider as needed.   Procedures: No procedures performed  Lumbar Epidural Steroid Injection - Interlaminar Approach with Fluoroscopic Guidance  Patient: [name]      Date of Birth: 10-05-1933 MRN: [id number] PCP: Vernon Velna JONELLE, MD      Visit Date: 10/27/2024   Universal Protocol:     Consent Given By: the patient  Position: PRONE  Additional Comments: Vital signs were monitored before and after the procedure. Patient was prepped and draped in the usual sterile fashion. The correct patient, procedure, and site was verified.   Injection Procedure Details:   Procedure diagnoses: Lumbar radiculopathy [M54.16]   Meds Administered:  Meds ordered this  encounter  Medications   methylPREDNISolone  acetate (DEPO-MEDROL ) injection 40 mg     Laterality: Left  Location/Site:  L5-S1  Needle: 3.5 in., 20 ga. Tuohy  Needle Placement: Paramedian epidural  Findings:   -Comments: Excellent flow of contrast into the epidural space.  Procedure Details: Using a paramedian approach from the side mentioned above, the region overlying the inferior lamina was localized under fluoroscopic visualization and the soft tissues overlying this structure were infiltrated with 4 ml. of 1% Lidocaine  without Epinephrine. The Tuohy needle was inserted into the epidural space using a paramedian approach.   The epidural space was localized using loss of resistance along with counter oblique bi-planar fluoroscopic views.  After negative aspirate for air, blood, and CSF, a 2 ml. volume of Isovue -250 was injected into the epidural space and the flow of contrast was observed. Radiographs were obtained for documentation purposes.    The injectate was administered into the level noted above.   Additional Comments:  The patient tolerated the procedure well Dressing: 2 x 2 sterile gauze and Band-Aid    Post-procedure details: Patient was observed during the procedure. Post-procedure instructions were reviewed.  Patient left the clinic in stable condition.   Clinical History: EXAM: MRI LUMBAR SPINE WITHOUT CONTRAST   TECHNIQUE: Multiplanar, multisequence MR imaging of the lumbar spine was performed. No intravenous contrast was administered.   COMPARISON:  Lumbar spine MRI 10/25/2018.   FINDINGS: Segmentation: Conventional numbering is assumed with 5 non-rib-bearing, lumbar type vertebral bodies.   Alignment: Levo scoliotic curvature of the lumbar spine.  Unchanged grade 1 anterolisthesis of L3 on L4 with trace retrolisthesis of L2 on L3.   Vertebrae: Heterogeneous marrow signal. Modic type 1 degenerative endplate marrow signal changes at L4-5,  unchanged.   Conus medullaris and cauda equina: Conus extends to the L2-3 level. Conus and cauda equina appear normal.   Paraspinal and other soft tissues: Fatty atrophy of the paraspinal muscles.   Disc levels:   T12-L1: Disc bulge and facet arthropathy results in mild spinal canal stenosis, new from prior. No neural foraminal narrowing.   L1-L2: Small disc bulge and endplate osteophytes without significant spinal canal stenosis. Facet arthropathy contributes to moderate right neural foraminal narrowing, unchanged.   L2-L3: Small disc osteophyte complex without significant spinal canal stenosis. Moderate disc height loss and facet arthropathy results in severe right and moderate left neural foraminal narrowing, unchanged.   L3-L4: Right eccentric disc bulge and facet arthropathy results in mild spinal canal stenosis and moderate right neural foraminal narrowing, unchanged.   L4-L5: Left eccentric disc bulge and facet arthropathy results in compression of the traversing left L5 nerve root in the lateral recess, worse from prior. Unchanged mild bilateral neural foraminal narrowing.   L5-S1: Left eccentric disc bulge and facet arthropathy results in compression of the traversing left S1 nerve root in the lateral recess, worse from prior. Unchanged mild left neural foraminal narrowing.   IMPRESSION: 1. Slight progression of lumbar spondylosis with new compression of the traversing left L5 and S1 nerve roots in the lateral recesses at L4-5 and L5-S1, respectively. 2. New mild spinal canal stenosis at T12-L1. 3. Otherwise unchanged moderate to severe neural foraminal narrowing at multiple levels, as described above.     Electronically Signed   By: Ryan Chess M.D.   On: 02/19/2023 09:14     Objective:  VS:  HT:    WT:   BMI:     BP:(!) 159/70  HR:68bpm  TEMP: ( )  RESP:  Physical Exam Vitals and nursing note reviewed.  Constitutional:      General: She is  not in acute distress.    Appearance: Normal appearance. She is not ill-appearing.  HENT:     Head: Normocephalic and atraumatic.     Right Ear: External ear normal.     Left Ear: External ear normal.  Eyes:     Extraocular Movements: Extraocular movements intact.  Cardiovascular:     Rate and Rhythm: Normal rate.     Pulses: Normal pulses.  Pulmonary:     Effort: Pulmonary effort is normal. No respiratory distress.  Abdominal:     General: There is no distension.     Palpations: Abdomen is soft.  Musculoskeletal:        General: Tenderness present.     Cervical back: Neck supple.     Right lower leg: No edema.     Left lower leg: No edema.     Comments: Patient has good distal strength with no pain over the greater trochanters.  No clonus or focal weakness.  Skin:    Findings: No erythema, lesion or rash.  Neurological:     General: No focal deficit present.     Mental Status: She is alert and oriented to person, place, and time.     Sensory: No sensory deficit.     Motor: No weakness or abnormal muscle tone.     Coordination: Coordination normal.  Psychiatric:        Mood and Affect: Mood normal.  Behavior: Behavior normal.      Imaging: No results found.

## 2024-11-03 NOTE — Procedures (Signed)
 Lumbar Epidural Steroid Injection - Interlaminar Approach with Fluoroscopic Guidance  Patient: [name]      Date of Birth: [birth year] MRN: [id number] PCP: Vernon Velna JONELLE, MD      Visit Date: 10/27/2024   Universal Protocol:     Consent Given By: the patient  Position: PRONE  Additional Comments: Vital signs were monitored before and after the procedure. Patient was prepped and draped in the usual sterile fashion. The correct patient, procedure, and site was verified.   Injection Procedure Details:   Procedure diagnoses: Lumbar radiculopathy [M54.16]   Meds Administered:  Meds ordered this encounter  Medications   methylPREDNISolone  acetate (DEPO-MEDROL ) injection 40 mg     Laterality: Left  Location/Site:  L5-S1  Needle: 3.5 in., 20 ga. Tuohy  Needle Placement: Paramedian epidural  Findings:   -Comments: Excellent flow of contrast into the epidural space.  Procedure Details: Using a paramedian approach from the side mentioned above, the region overlying the inferior lamina was localized under fluoroscopic visualization and the soft tissues overlying this structure were infiltrated with 4 ml. of 1% Lidocaine  without Epinephrine. The Tuohy needle was inserted into the epidural space using a paramedian approach.   The epidural space was localized using loss of resistance along with counter oblique bi-planar fluoroscopic views.  After negative aspirate for air, blood, and CSF, a 2 ml. volume of Isovue -250 was injected into the epidural space and the flow of contrast was observed. Radiographs were obtained for documentation purposes.    The injectate was administered into the level noted above.   Additional Comments:  The patient tolerated the procedure well Dressing: 2 x 2 sterile gauze and Band-Aid    Post-procedure details: Patient was observed during the procedure. Post-procedure instructions were reviewed.  Patient left the clinic in stable  condition.

## 2024-11-26 DIAGNOSIS — H43813 Vitreous degeneration, bilateral: Secondary | ICD-10-CM | POA: Diagnosis not present

## 2024-11-26 DIAGNOSIS — H401122 Primary open-angle glaucoma, left eye, moderate stage: Secondary | ICD-10-CM | POA: Diagnosis not present

## 2024-11-26 DIAGNOSIS — H401113 Primary open-angle glaucoma, right eye, severe stage: Secondary | ICD-10-CM | POA: Diagnosis not present
